# Patient Record
Sex: Male | Born: 1990 | Race: White | Hispanic: No | Marital: Single | Smoking: Current some day smoker
Health system: Southern US, Community
[De-identification: ages and names within clinical notes are randomized; demographics above are authoritative.]

## PROBLEM LIST (undated history)

## (undated) DIAGNOSIS — F840 Autistic disorder: Secondary | ICD-10-CM

## (undated) DIAGNOSIS — I10 Essential (primary) hypertension: Secondary | ICD-10-CM

## (undated) DIAGNOSIS — R569 Unspecified convulsions: Secondary | ICD-10-CM

## (undated) HISTORY — DX: Essential (primary) hypertension: I10

## (undated) HISTORY — DX: Unspecified convulsions: R56.9

---
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
## 2012-07-25 ENCOUNTER — Ambulatory Visit: Payer: Self-pay | Admitting: Neurology

## 2012-11-23 LAB — DRUG SCREEN, URINE
Amphetamines, Ur Screen: NEGATIVE
Barbiturates, Ur Screen: NEGATIVE
Benzodiazepine, Ur Scrn: NEGATIVE
Cannabinoid 50 Ng, Ur ~~LOC~~: NEGATIVE
Cocaine Metabolite,Ur ~~LOC~~: NEGATIVE
MDMA (Ecstasy)Ur Screen: NEGATIVE
Methadone, Ur Screen: NEGATIVE
Opiate, Ur Screen: NEGATIVE
Phencyclidine (PCP) Ur S: NEGATIVE
Tricyclic, Ur Screen: NEGATIVE

## 2012-11-23 LAB — URINALYSIS, COMPLETE
Bacteria: NONE SEEN
Blood: NEGATIVE
Leukocyte Esterase: NEGATIVE
Nitrite: NEGATIVE
Protein: NEGATIVE
RBC,UR: NONE SEEN /HPF (ref 0–5)
Specific Gravity: 1.014 (ref 1.003–1.030)
WBC UR: NONE SEEN /HPF (ref 0–5)

## 2012-11-23 LAB — CBC
HCT: 45.2 % (ref 40.0–52.0)
MCV: 80 fL (ref 80–100)
Platelet: 366 10*3/uL (ref 150–440)
RBC: 5.67 10*6/uL (ref 4.40–5.90)
WBC: 10.1 10*3/uL (ref 3.8–10.6)

## 2012-11-23 LAB — COMPREHENSIVE METABOLIC PANEL WITH GFR
Albumin: 4.5 g/dL
Alkaline Phosphatase: 131 U/L
Anion Gap: 13
BUN: 12 mg/dL
Bilirubin,Total: 0.1 mg/dL — ABNORMAL LOW
Calcium, Total: 8.2 mg/dL — ABNORMAL LOW
Chloride: 104 mmol/L
Co2: 21 mmol/L
Creatinine: 1.21 mg/dL
EGFR (African American): >60
EGFR (Non-African Amer.): >60
Glucose: 121 mg/dL — ABNORMAL HIGH
Osmolality: 277
Potassium: 3.3 mmol/L — ABNORMAL LOW
SGOT(AST): 23 U/L
SGPT (ALT): 22 U/L
Sodium: 138 mmol/L
Total Protein: 8.2 g/dL

## 2012-11-23 LAB — ETHANOL: Ethanol %: <0.003 % (ref 0.000–0.080)

## 2012-11-23 LAB — CARBAMAZEPINE LEVEL, TOTAL: Carbamazepine: 10.7 ug/mL (ref 4.0–12.0)

## 2012-11-24 ENCOUNTER — Inpatient Hospital Stay: Payer: Self-pay | Admitting: Internal Medicine

## 2012-11-24 ENCOUNTER — Ambulatory Visit: Payer: Self-pay | Admitting: Neurology

## 2012-11-25 DIAGNOSIS — R011 Cardiac murmur, unspecified: Secondary | ICD-10-CM

## 2012-11-25 LAB — CBC WITH DIFFERENTIAL/PLATELET
Basophil #: 0 10*3/uL (ref 0.0–0.1)
Basophil %: 0.5 %
HCT: 45.1 % (ref 40.0–52.0)
HGB: 14.7 g/dL (ref 13.0–18.0)
Lymphocyte %: 20.3 %
MCH: 25.8 pg — ABNORMAL LOW (ref 26.0–34.0)
MCV: 79 fL — ABNORMAL LOW (ref 80–100)
Monocyte #: 0.5 x10 3/mm (ref 0.2–1.0)
Platelet: 267 10*3/uL (ref 150–440)
RDW: 19.1 % — ABNORMAL HIGH (ref 11.5–14.5)
WBC: 4.3 10*3/uL (ref 3.8–10.6)

## 2012-11-25 LAB — BASIC METABOLIC PANEL
Co2: 24 mmol/L (ref 21–32)
EGFR (African American): >60
Glucose: 105 mg/dL — ABNORMAL HIGH (ref 65–99)
Potassium: 3.6 mmol/L (ref 3.5–5.1)
Sodium: 134 mmol/L — ABNORMAL LOW (ref 136–145)

## 2014-01-11 ENCOUNTER — Emergency Department: Payer: Self-pay | Admitting: Student

## 2014-01-11 LAB — DRUG SCREEN, URINE
Amphetamines, Ur Screen: NEGATIVE (ref ?–1000)
Barbiturates, Ur Screen: NEGATIVE (ref ?–200)
Benzodiazepine, Ur Scrn: NEGATIVE (ref ?–200)
COCAINE METABOLITE, UR ~~LOC~~: NEGATIVE (ref ?–300)
Cannabinoid 50 Ng, Ur ~~LOC~~: NEGATIVE (ref ?–50)
MDMA (ECSTASY) UR SCREEN: NEGATIVE (ref ?–500)
Methadone, Ur Screen: NEGATIVE (ref ?–300)
OPIATE, UR SCREEN: NEGATIVE (ref ?–300)
Phencyclidine (PCP) Ur S: NEGATIVE (ref ?–25)
Tricyclic, Ur Screen: NEGATIVE (ref ?–1000)

## 2014-01-11 LAB — COMPREHENSIVE METABOLIC PANEL
ALK PHOS: 143 U/L — AB
ANION GAP: 9 (ref 7–16)
AST: 16 U/L (ref 15–37)
Albumin: 3.7 g/dL (ref 3.4–5.0)
BILIRUBIN TOTAL: 0.1 mg/dL — AB (ref 0.2–1.0)
BUN: 6 mg/dL — AB (ref 7–18)
CALCIUM: 8.1 mg/dL — AB (ref 8.5–10.1)
CO2: 25 mmol/L (ref 21–32)
Chloride: 108 mmol/L — ABNORMAL HIGH (ref 98–107)
Creatinine: 0.93 mg/dL (ref 0.60–1.30)
EGFR (African American): >60
Glucose: 97 mg/dL (ref 65–99)
Osmolality: 281 (ref 275–301)
Potassium: 3.5 mmol/L (ref 3.5–5.1)
SGPT (ALT): 18 U/L
Sodium: 142 mmol/L (ref 136–145)
TOTAL PROTEIN: 7.1 g/dL (ref 6.4–8.2)

## 2014-01-11 LAB — CBC
HCT: 44.7 % (ref 40.0–52.0)
HGB: 14.4 g/dL (ref 13.0–18.0)
MCH: 28.2 pg (ref 26.0–34.0)
MCHC: 32.3 g/dL (ref 32.0–36.0)
MCV: 87 fL (ref 80–100)
PLATELETS: 262 10*3/uL (ref 150–440)
RBC: 5.12 10*6/uL (ref 4.40–5.90)
RDW: 15.2 % — AB (ref 11.5–14.5)
WBC: 5.8 10*3/uL (ref 3.8–10.6)

## 2014-01-11 LAB — ETHANOL: Ethanol: <3 mg/dL (ref 0–80)

## 2014-01-11 LAB — ACETAMINOPHEN LEVEL: Acetaminophen: <2 ug/mL

## 2014-01-11 LAB — TSH: Thyroid Stimulating Horm: 1.99 u[IU]/mL

## 2014-01-11 LAB — SALICYLATE LEVEL: Salicylates, Serum: <1.7 mg/dL

## 2014-01-19 IMAGING — CT CT HEAD WITHOUT CONTRAST
2 series · 15 of 30 positions shown, 19 images · non-contrast
Comparison: none

REASON FOR EXAM: sz recurrent
COMMENTS:

[Series 2: without · axial · non-contrast · 0.43mm/px · z∈[+156,+276]mm · 13 of 30 slices shown, 17 images]
[im 3/30  brain]
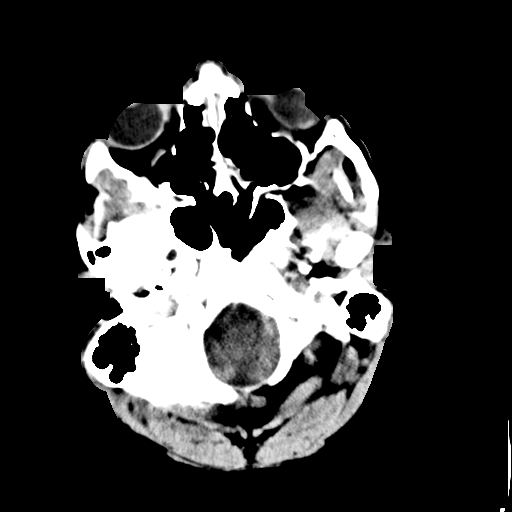
[im 3/30  bone]
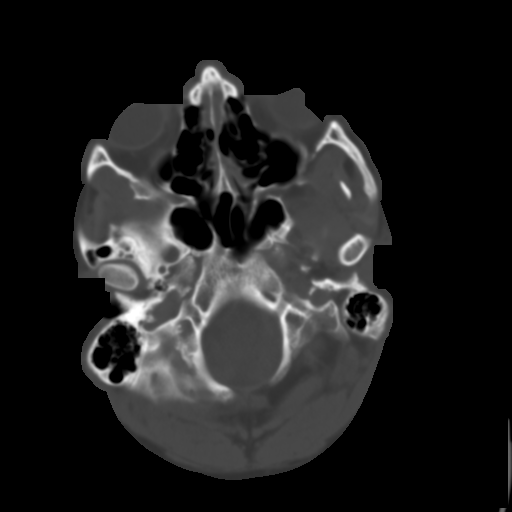
[im 5/30  brain]
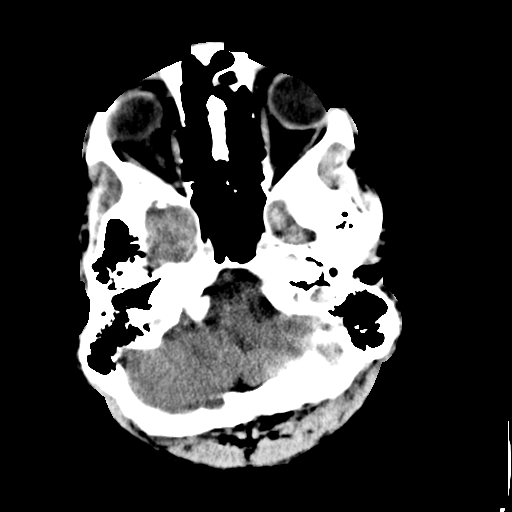
[im 7/30  brain]
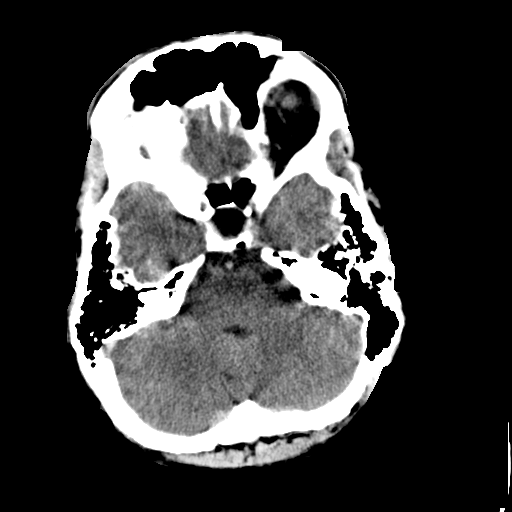
[im 9/30  brain]
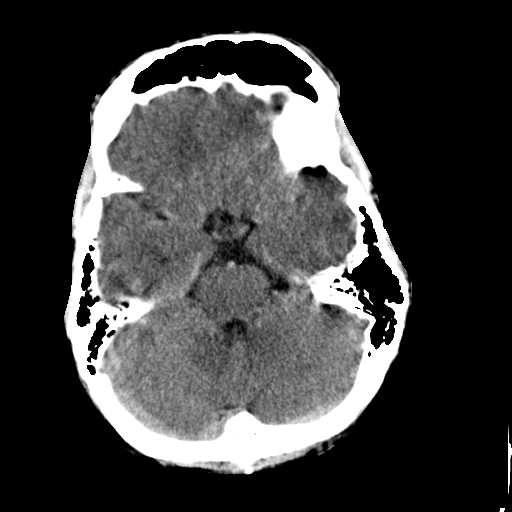
[im 11/30  brain]
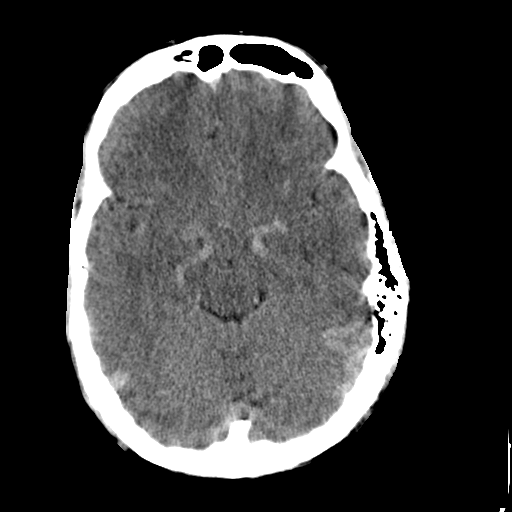
[im 11/30  bone]
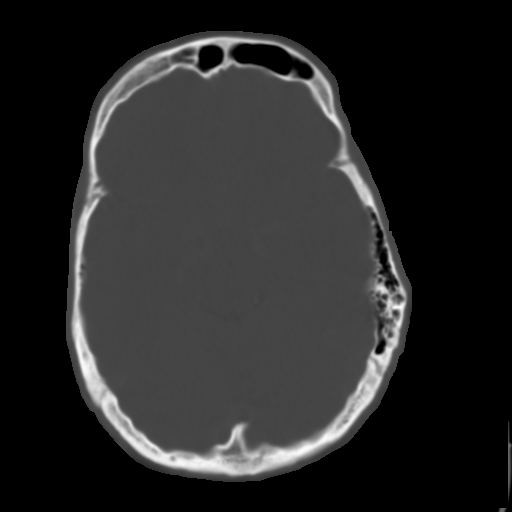
[im 13/30  brain]
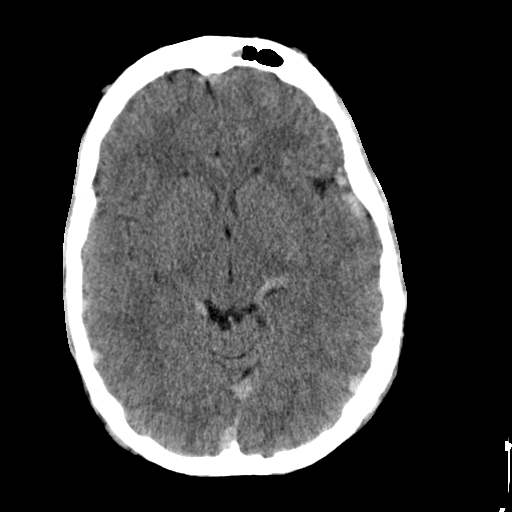
[im 15/30  brain]
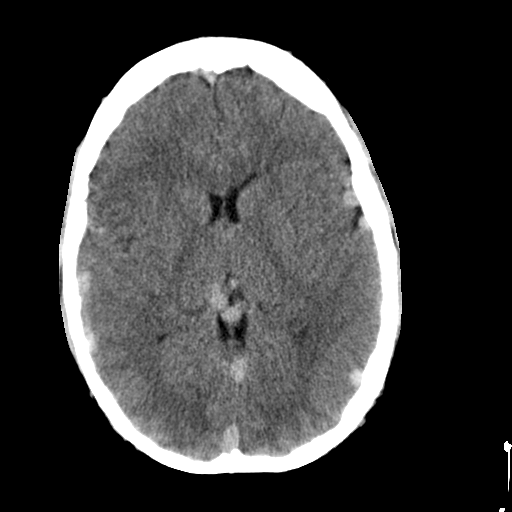
[im 17/30  brain]
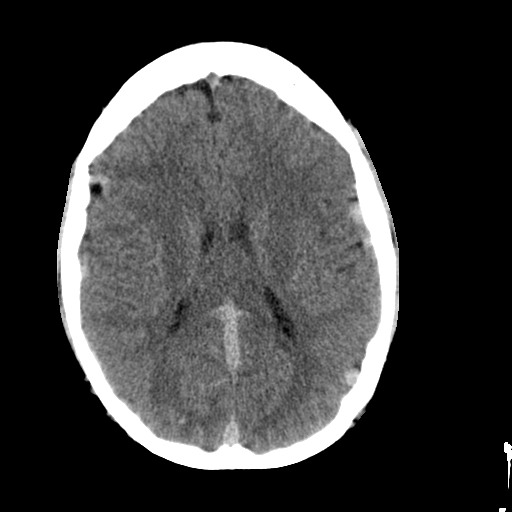
[im 19/30  brain]
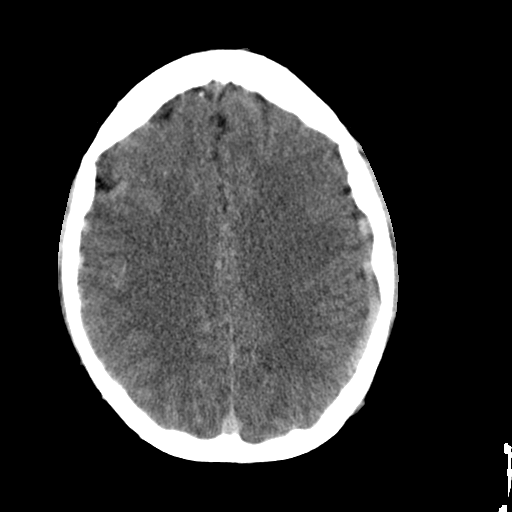
[im 19/30  bone]
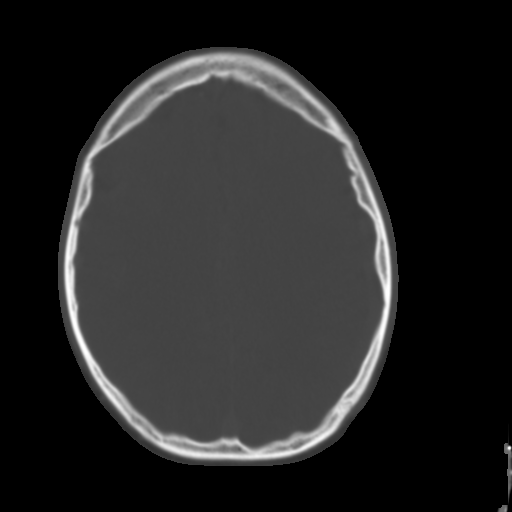
[im 21/30  brain]
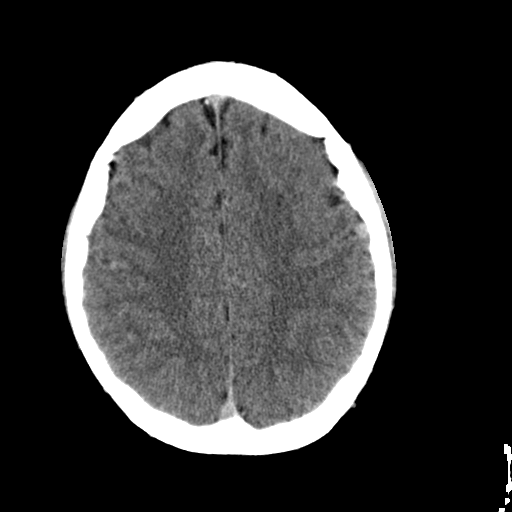
[im 23/30  brain]
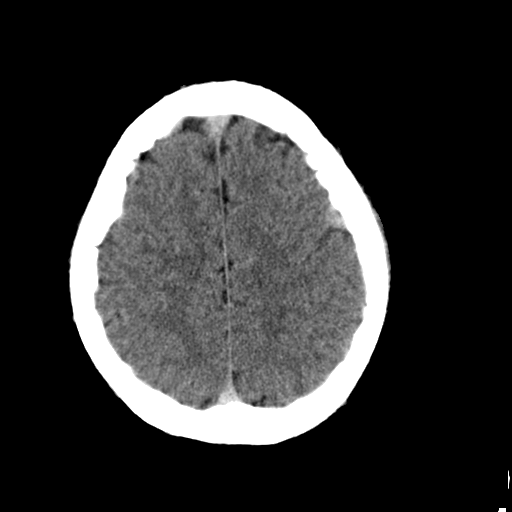
[im 25/30  brain]
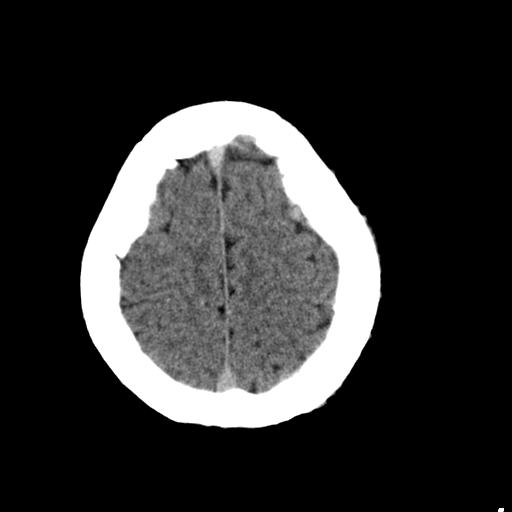
[im 27/30  brain]
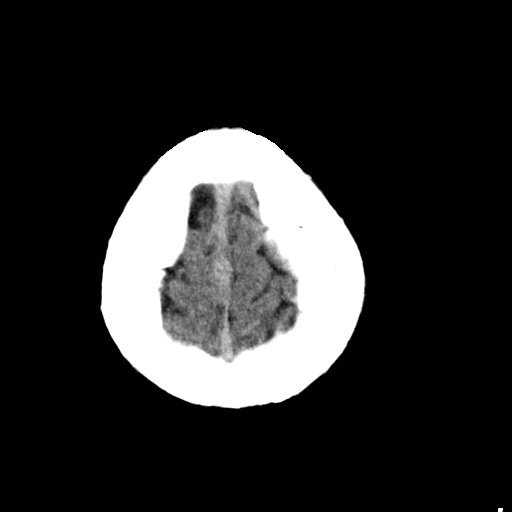
[im 27/30  bone]
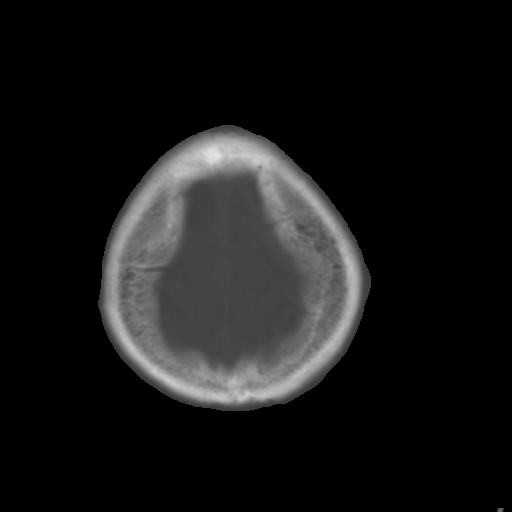

[Series 3: bone · axial · 0.43mm/px · z∈[+156,+176]mm · 2 of 30 slices shown]
[im 3/30  bone]
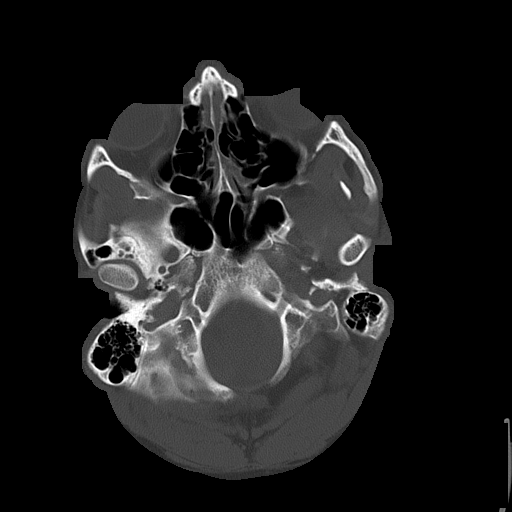
[im 7/30  bone]
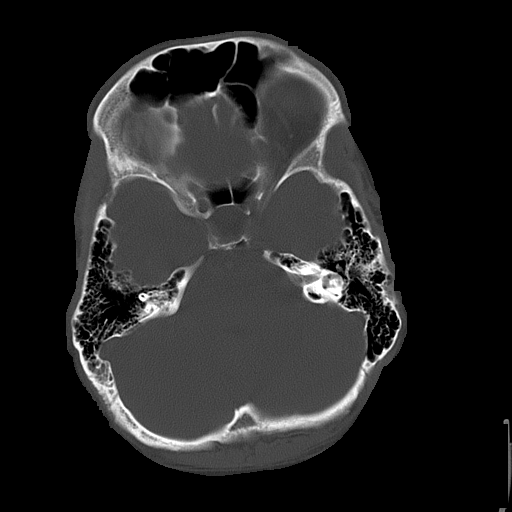

[15 of 30 positions shown; findings below may reference images not displayed]

PROCEDURE:     CT  - CT HEAD WITHOUT CONTRAST  - December 16, 2011  [DATE]

RESULT:     Emergent noncontrast CT of the brain is performed. The patient
has no previous exam for comparison.

There is increased density in the extra-axial regions bilaterally the there
is also increased density of the vessels in the region of the circle of
Willis and elsewhere. This can be seen in slow flow situations. The
extra-axial collections appear to have a somewhat tubular appearance and
could represent prominent vascular collections but the possibility of
subarachnoid hemorrhage ends small subdural hematomas should be considered
and transfer to a tertiary care center is recommended. No skull fracture is
evident. The sinuses are clear. The orbits appear unremarkable.
IMPRESSION: Abnormal appearance with prominence of the vessels in the
circle of Willis it can be related to slow flow artifact. Prominent areas of
increased density in the extra-axial regions especially of the left temporal
and parietal regions. Some increased density along sulci near the right
sylvian fissure. Findings are concerning for subarachnoid and subdural
hemorrhage.

[REDACTED](*)

## 2014-05-31 ENCOUNTER — Emergency Department: Payer: Self-pay | Admitting: Emergency Medicine

## 2014-06-07 ENCOUNTER — Emergency Department: Payer: Self-pay | Admitting: Emergency Medicine

## 2014-06-24 ENCOUNTER — Emergency Department
Admit: 2014-06-24 | Disposition: A | Discharge status: Home / Self Care | Payer: Self-pay | Admitting: Emergency Medicine

## 2014-06-24 LAB — COMPREHENSIVE METABOLIC PANEL
Albumin: 4.6 g/dL
Alkaline Phosphatase: 82 U/L
Anion Gap: 6 — ABNORMAL LOW (ref 7–16)
BILIRUBIN TOTAL: 0.4 mg/dL
BUN: 13 mg/dL
CHLORIDE: 112 mmol/L — AB
Calcium, Total: 8.4 mg/dL — ABNORMAL LOW
Co2: 21 mmol/L — ABNORMAL LOW
Creatinine: 0.83 mg/dL
EGFR (Non-African Amer.): >60
Glucose: 109 mg/dL — ABNORMAL HIGH
Potassium: 3.9 mmol/L
SGOT(AST): 18 U/L
SGPT (ALT): 15 U/L — ABNORMAL LOW
Sodium: 139 mmol/L
TOTAL PROTEIN: 7.6 g/dL

## 2014-06-24 LAB — DRUG SCREEN, URINE
AMPHETAMINES, UR SCREEN: NEGATIVE
BARBITURATES, UR SCREEN: NEGATIVE
Benzodiazepine, Ur Scrn: NEGATIVE
Cannabinoid 50 Ng, Ur ~~LOC~~: NEGATIVE
Cocaine Metabolite,Ur ~~LOC~~: NEGATIVE
MDMA (Ecstasy)Ur Screen: NEGATIVE
METHADONE, UR SCREEN: NEGATIVE
OPIATE, UR SCREEN: NEGATIVE
Phencyclidine (PCP) Ur S: NEGATIVE
Tricyclic, Ur Screen: NEGATIVE

## 2014-06-24 LAB — ETHANOL: Ethanol: <5 mg/dL

## 2014-06-24 LAB — CBC
HCT: 45.7 % (ref 40.0–52.0)
HGB: 14.7 g/dL (ref 13.0–18.0)
MCH: 26.4 pg (ref 26.0–34.0)
MCHC: 32.1 g/dL (ref 32.0–36.0)
MCV: 82 fL (ref 80–100)
Platelet: 263 10*3/uL (ref 150–440)
RBC: 5.55 10*6/uL (ref 4.40–5.90)
RDW: 15.6 % — AB (ref 11.5–14.5)
WBC: 5.1 10*3/uL (ref 3.8–10.6)

## 2014-06-24 LAB — URINALYSIS, COMPLETE
BILIRUBIN, UR: NEGATIVE
Blood: NEGATIVE
Glucose,UR: NEGATIVE mg/dL (ref 0–75)
KETONE: NEGATIVE
Leukocyte Esterase: NEGATIVE
Nitrite: NEGATIVE
Ph: 6 (ref 4.5–8.0)
SPECIFIC GRAVITY: 1.024 (ref 1.003–1.030)
SQUAMOUS EPITHELIAL: NONE SEEN
WBC UR: 4 /HPF (ref 0–5)

## 2014-06-24 LAB — SALICYLATE LEVEL: Salicylates, Serum: <4 mg/dL

## 2014-06-24 LAB — ACETAMINOPHEN LEVEL: Acetaminophen: <10 ug/mL

## 2014-07-17 NOTE — H&P (Signed)
PATIENT NAME:  Brendan Gamble, Brendan Gamble MR#:  [id number] DATE OF BIRTH:  10-Oct-1990  DATE OF ADMISSION:  11/24/2012  PRIMARY CARE PHYSICIAN:  Nonlocal out of [place].   REFERRING PHYSICIAN:  Dr. Kerman Passey.   HISTORY OF PRESENT ILLNESS:  Brendan Gamble is a 24 year old Hispanic gentleman, past medical history significant for ADHD, autism, seizure disorder, as well as history of subarachnoid hemorrhage requiring craniotomy, who is presenting after an episode of witnessed seizure, was witnessed by his father, described as tonic-clonic with generalized shaking, lasting 30 to 60 seconds, which broke spontaneously.  Since that original episode he has had five further seizures witnessed by EMS in the Emergency Department.  He has been given a total of 7 mg of Ativan and had no further seizure episodes.  As far as his seizure history, there is concern he has a history of seizures for a number of years, every 4 to 6 months.  His last seizure was approximately six months ago.  All with similar presentation, tonic-clonic in nature.  This particular episode he was in a postictal state for several minutes and then returned to baseline as according to his father, prior to his seizure episode.  He is a poor historian, but denies any fevers, chills, decreased by mouth intake or any further symptoms.  In the Emergency Department he was noted to be tachycardic with heart rates ranging between the 140s and 150s.   REVIEW OF SYSTEMS:  CONSTITUTIONAL:  Denies fevers, weight changes.   EYES:  Denies any visual changes.  EARS, NOSE, THROAT, MOUTH:  Denies any oral lesions.  CARDIOVASCULAR:  Denies chest pain or palpitations despite heart rate.  RESPIRATION:  Denies cough, shortness of breath.  GASTROINTESTINAL:  Denies nausea, vomiting, diarrhea, constipation.  GENITOURINARY:  Denies urinary frequency as well as dysuria.  MUSCULOSKELETAL:  No pain or weakness.  SKIN:  No lesions or rashes.  NEUROLOGICALLY:  Seizure as described as  above.  PSYCHIATRIC:  Denies any anxiety or depression.    HEMATOLOGY AND LYMPHATIC:  Denies easy bruisability or bleeding.  ALLERGY AND IMMUNOLOGY:  No active symptoms.  Otherwise full review of systems performed by me is negative.   PAST MEDICAL HISTORY:  Seizure disorder, once again every 4 to 6 months.  Most recent seizure prior to this episode was about six months ago.  Also has a past medical history of subarachnoid hemorrhage status post craniotomy, autism, ADHD.   FAMILY HISTORY:  Denies any history of seizure disorder per the father.   SOCIAL HISTORY:  Currently resides in a group facility with no alcohol, tobacco or illicit drug usage.   ALLERGIES:  No known drug allergies.   HOME MEDICATIONS:  Carbamazepine extended release 800 mg by mouth twice daily, diazepam 20 mg, rectal kit 12.5 mg 1 daily as needed for seizures lasting greater than five minutes, Pepcid 20 mg by mouth twice daily, lorazepam 0.5 mg oral tablet as needed for agitation, Zoloft 100 mg two tablets by mouth daily, Zonegran 200 mg by mouth q. a.m., 300 mg by mouth q. p.m.   PHYSICAL EXAMINATION:   VITAL SIGNS:  Temperature 99.3, pulse 145, respirations 20, blood pressure 130/70, pulse ox 97 on room air.  GENERAL:  He is in no acute distress and he is awake, alert, able to answer simple yes or no questions.  HEENT:  Normocephalic, atraumatic.  Extraocular muscles intact.  Pupils equal, round and reactive to light as well as accommodation.  Moist mucosal membranes.  He does have  a scar from his craniotomy which is well-healed.   CARDIOVASCULAR:  S1, S2, tachycardic, which is regular.  No murmurs, rubs or gallops.  PULMONARY:  Clear to auscultation bilaterally without wheezes, rubs or rhonchi.  ABDOMEN:  Soft, nontender, nondistended with positive bowel sounds.  EXTREMITIES:  Reveal no cyanosis, edema or clubbing.  NEUROLOGIC:  It is difficult to fully assess neurological exam secondary to patient's mental status and a  lack of compliance.   LABORATORY DATA:  Sodium 138, potassium 3.3, chloride 104, bicarb 21, BUN 12, creatinine 1.21, glucose 121, calcium 8.2.  Ethanol less than 0.003, total protein 8.2, albumin 4.5, bilirubin 0.1, alkaline phosphatase 131, AST 23, ALT 22.  Tegretol 10.7, within therapeutic window.  WBC 10.1, hemoglobin 14.7, platelets 366.  EKG, sinus tachycardic, heart rate of 148, narrow complex and irregular rhythm.  No ST or T abnormalities.  CT head performed revealing no acute changes.  Official read is pending at this time.   ASSESSMENT AND PLAN:  This is a 24 year old Hispanic gentleman with history of seizure disorder, last episode six months ago, history of subarachnoid hemorrhage status post craniotomy, autism, attention deficit hyperactivity disorder, who is presenting after multiple witnessed seizure episodes which are tonic and clonic in nature.  He required approximately 7 mg of Ativan in the Emergency Department.  He is also noted to be tachycardic with heart rate in the 150s. 1.  Multiple tonic-clonic seizures.  This gentleman has a history of a known seizure disorder and usually takes Tegretol as well as Zonegran.  His Tegretol level is within therapeutic window.  Recommend ordering a Zonegran level to be checked.  In the meantime, give Ativan is needed.  If his Zonegran level is therapeutic, we will consult neurology for breakthrough seizures.  Check a prolactin level as well to confirm his seizure status.  2.  Tachycardia with no evidence of infection, likely stress-related secondary to seizures.  Continue with IV fluid hydration and monitor heart rate for a response.  3.  Hypokalemia of 3.3.  Replace potassium.  4.  Hyperglycemia, most likely stress response.  No history of diabetes.  5.  Gastroesophageal reflux disease.  Continue with Pepcid.  6.  Anxiety.  Continue with Zoloft.  7.  The patient is FULL CODE.   Total time spent 33 minutes.     ____________________________ Aaron Mose. Hower, MD dkh:ea D: 11/23/2012 23:13:14 ET T: 11/24/2012 01:05:16 ET JOB#: [id number]  cc: Aaron Mose. Hower, MD, <Dictator> DAVID Woodfin Ganja MD ELECTRONICALLY SIGNED 11/24/2012 3:46

## 2014-07-17 NOTE — Consult Note (Signed)
PATIENT NAME:  Brendan Gamble, Brendan Gamble MR#:  [id number] DATE OF BIRTH:  [birth year]  DATE OF CONSULTATION:  11/24/2012  CONSULTING PHYSICIAN:  Leotis Pain, MD  REASON FOR CONSULTATION:  Seizure.   HISTORY OF PRESENT ILLNESS:  HPI is obtained from patient's chart and patient's doctor. [name] is a 24 year old Hispanic gentleman with past medical history of ADHD, autism, seizure disorder, history of subarachnoid hemorrhage, status post left craniotomy, presenting after episode of seizure that was witnessed by his father. Described as tonic-clonic seizure activity lasting 30 to 60 seconds, which resolved spontaneously. The patient was postictal. EMS was called. While en route via EMS and in the Emergency Department, he had multiple seizures back to back, again generalized tonic-clonic seizures. He was given a total of 7 mg of Ativan. Last seizure about 6 months ago, which was similar to the current presentation of tonic-clonic seizure activity. The patient's medications include Zonegran 200 mg in morning, 300 mg at night, and Topamax 800 mg twice a day, with therapeutic level of 10.7. Currently patient appears to be improving. His mental status has improved. Not sure if he is quite at baseline yet, but does follow my commands.   REVIEW OF SYSTEMS:  Unable to assess from patient's current condition, which could be at his baseline.   PAST MEDICAL HISTORY: History of seizure disorder, last seizure about 6 months ago, history of autism, ADHD, subarachnoid hemorrhage, status post craniotomy.   FAMILY HISTORY:  No history of seizures.   SOCIAL HISTORY:  Currently resides in a group home.   ALLERGIES:  No known drug allergies.   MEDICATIONS:  Include Tegretol 800 mg twice a day, diazepam 20 mg rectal kit as needed  for seizures, Pepcid 20 mg a day, lorazepam 0.5 mg p.r.n. for agitation, Zoloft 100 mg by mouth daily, Zonegran 200 mg in morning, 300 mg in evening.  LABORATORY WORKUP:  Included a urinalysis, which  is negative for urinary tract infection. U-tox is negative as well. His white blood cell count is 10.1, hemoglobin 14.7, hematocrit 45.2, glucose serum is 121, BUN 12, creatinine is 1.21. Imaging includes a CAT scan of the head that did not show any acute abnormality.   PHYSICAL EXAMINATION: VITAL SIGNS:  Includes blood pressure 120/67, respirations 22, pulse is elevated at 111.   NEUROLOGIC: The patient is able to follow one-step commands, such as sticking out his tongue, show me his  right thumb and then his left thumb. Able to close his eyes symmetrically, responds to visual threats. Appears to have generalized weakness bilateral upper and lower extremities, but withdraws from painful stimuli. Reflexes are elevated throughout. Sensation  appears to be intact, as he withdraws from painful stimuli. Coordination could not be assessed. Gait could not be assessed.   IMPRESSION: A 24 year old male with history of seizure disorder, history of subarachnoid hemorrhage, status post craniotomy, on dual antiepileptics including carbamazepine 800 b.i.d., with a level of 10.7, Zonegran 200 mg in morning and 300 mg in evening, presenting with multiple seizures. Unclear what the provoking source was, last seizure 4 to 6 months ago.   PLAN: The patient's Zonegran is increased to 300 mg b.i.d., which is the max dose. Continue the Tegretol at 800 mg b.i.d., as is therapeutic. MRI of the brain with and without contrast to look at any intracranial pathology which could be contributing to possibility of his worsening seizures. It appears that patient's mental status has significantly improved, but unclear what his baseline is at this point. If MRI  of the brain that is done with and without contrast does not show any acute intracranial pathology, will consider discharging patient tomorrow if his mental status is back to baseline on the current antiepileptics of 800 mg of Tegretol b.i.d. and Zonegran 300 mg b.i.d. He can follow  up with EEG as outpatient.   Thank you, it was a pleasure seeing this patient.    ____________________________ Leotis Pain, MD yz:mr D: 11/24/2012 12:26:00 ET T: 11/24/2012 19:24:22 ET JOB#: [id number]  cc: Leotis Pain, MD, <Dictator> Leotis Pain MD ELECTRONICALLY SIGNED 12/09/2012 12:12

## 2014-07-17 NOTE — Discharge Summary (Signed)
PATIENT NAME:  Brendan Gamble, Brendan Gamble MR#:  [id number] DATE OF BIRTH:  09/12/1990  DATE OF ADMISSION:  11/24/2012 DATE OF DISCHARGE:  11/26/2012  ADMITTING DIAGNOSIS: Multiple tonic-clonic seizures.   DISCHARGE DIAGNOSES: 1.  Multiple tonic-clonic seizure, with history of previous seizure disorder.  2.  Autism.  3.  Attention deficit/hyperactivity disorder.  4.  Anxiety.  5.  History of right lower lobe pneumonia in 2007, requiring hospitalization.  6.  Sinus tachycardia, possibly anxiety-related; heart rate now improved on a beta blocker.   CONSULTANTS: Dr. Harlene Ramus.   PERTINENT LABS AND EVALUATIONS: Admitting glucose 121, BUN 12, creatinine 1.21, sodium 138, potassium 3.3, chloride 104, CO2 is 21, calcium is 8.2.   LFTs are normal except bili total 0.1. Tegretol level 10.7, TUDS were negative. WBC 10.1, hemoglobin 14.7, platelet count 366, prolactin level was 96.8.   Echocardiogram showed EF of 60% to 65%, normal global left ventricular systolic function.   EKG shows sinus tachycardia   CT scan of the head shows no acute abnormality. Postsurgical changes noted about the skull. Surgical wires noted.  HOSPITAL COURSE: Please refer to H and P done by the admitting physician. The patient is a 24 year old Hispanic male with a past medical history of ADHD, autism, seizure disorder as well as a history of subarachnoid hemorrhage requiring craniotomy, presented after an episode of a witnessed seizure. The patient was on carbamazepine and Zonegran. The levels of these were therapeutic. The patient was admitted for seizures. He was seen in consultation by Dr. Irish Elders of neurology.   Plan was to do an MRI, however, the patient has surgical clips so he was unable to have an MRI. The patient was scheduled to have an EEG because of the weekend today, but Dr. Irish Elders felt that he did not need an EEG since he had no further seizures in 48 hours after starting Keppra. At this time, the patient is doing  well and is not having any further seizure episodes. He is stable for discharge.   DISCHARGE MEDICATIONS: Diazepam 20 mg rectal kit, 12.5 rectally once a day as needed for seizure lasting more than 5 minutes, Zoloft 200 daily, Carbatrol 200 mg 4 capsules  b.i.d., Zonegran 300 mg at bedtime, lorazepam 0.5 daily as needed, Zonegran 100 mg 2 capsules daily, Keppra 750 mg 1 tablet p.o. q.12, metoprolol tartrate 25 one tablet p.o. t.i.d.   DIET: Regular.   ACTIVITY: As tolerated.   FOLLOWUP: With primary MD in 1 to 2 weeks. Follow with neurology, Dr. Melrose Nakayama, his primary neurologist, in 2 to 4 weeks.    Note: 35 minutes spent on this discharge.     ____________________________ Lafonda Mosses. Posey Pronto, MD shp:dm D: 11/27/2012 08:19:00 ET T: 11/27/2012 09:11:41 ET JOB#: [id number]  cc: Sammy Douthitt H. Posey Pronto, MD, <Dictator> Alric Seton MD ELECTRONICALLY SIGNED 11/29/2012 15:11
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
## 2014-07-19 NOTE — H&P (Signed)
PATIENT NAME:  Brendan Gamble, Brendan Gamble MR#:  [id number] DATE OF BIRTH:  24-Jun-1990  DATE OF ADMISSION:  04/30/2011  REFERRING PHYSICIAN: Dr. Renee Ramus  PRIMARY CARE PHYSICIAN: Unknown  REASON FOR ADMISSION: Seizures.  HISTORY OF PRESENT ILLNESS: Patient is an 24 year old male with past medical history listed below including history of known seizure disorder, attention deficit hyperactivity disorder, autism who is sent from a local group home to the ER due to witnessed seizures while at the group home. Patient was brought to the ER was witnessed to have active seizures in the ER as well requiring use of IV Ativan. Currently on my evaluation he is postictal and unresponsive. He is, however, hemodynamically stable with stable vitals. He did not respond to deep tactile stimuli or to verbal commands. No family members are currently present and a sitter was here initially from the group home, however, there are no staff members from the group home currently present in the ER. History was obtained from speaking with the ER physician. ER physician informed me that patient had seizures at the group home witnessed and also witnessed here in the ER requiring IV Ativan and patient is now postictal. Apparently per group home staff patient has been compliant with his anticonvulsant therapy consisting of Zonegran and carbamazepine. Serum carbamazepine levels have been sent off in the ER but have not returned yet. Noncontrast head CT was obtained which was negative for acute intracranial abnormalities. Patient is currently afebrile and without leukocytosis. Given recurrent seizures hospitalist services were contacted for further evaluation.   PAST SURGICAL HISTORY: Unknown.   PAST MEDICAL HISTORY: 1. Seizure disorder.  2. Autism.  3. Attention deficit hyperactivity disorder.  4. Anxiety.  5. History of right lower lobe pneumonia requiring hospitalization January 2007.   ALLERGIES: No known drug allergies.   DISCHARGE  MEDICATIONS:  1. Zonegran 100 mg 2 caplets p.o. b.i.d.  2. Zoloft 100 mg 2 tablets p.o. daily.  3. Risperidone 0.5 mg, patient takes 0.25 tabs p.o. b.i.d.  4. Lorazepam 0.5 mg 1/2 tab in the a.m. for anxiety.  5. Diazepam 20 mg rectal kit 12 mg rectally once daily as needed for seizure lasting more than five minutes.  6. Carbatrol extended release 200 mg, patient takes 3 caplets every morning and 4 caplets at bedtime.   FAMILY HISTORY: Unobtainable from the patient. According to most recent history and physical there is no family history of seizure disorder.   SOCIAL HISTORY: Unobtainable from the patient as he is currently unresponsive.   REVIEW OF SYSTEMS: Unobtainable from the patient as he is currently unresponsive.   PHYSICAL EXAMINATION:  VITAL SIGNS: Temperature 99.7, pulse 95, blood pressure 136/52, respirations 20, oxygen saturation 99% on room air.   GENERAL: Patient is unresponsive, does not respond to tactile or verbal stimuli, does not appear to be acutely distressed. He is not actively seizing at this time.   HEENT: Normocephalic, atraumatic. Pupils are pinpoint but reactive. Cannot assess his extraocular muscle function but doll's eyes are intact bilaterally. Sclerae and conjunctivae pink. Nares without drainage. Difficult to inspect oropharynx fully as patient does not follow commands but tongue appears moist, no lacerations or lesions noted on tongue.   NECK: Supple. No jugular venous distention, lymphadenopathy, or carotid bruits bilaterally. No thyromegaly.   CHEST: Normal respiratory effort without use of accessory respiratory muscles. Lungs are clear to auscultation bilateral without crackles, rales, wheezing.   CARDIOVASCULAR: S1, S2 positive. Regular rate and rhythm. No murmurs, rubs, or gallops. PMI is non-lateralized.  ABDOMEN: Patient did not wince with deep palpation. Normoactive bowel sounds. No hepatosplenomegaly or palpable masses. No hernia.   EXTREMITIES:  No clubbing, cyanosis, or edema. Pedal pulses are palpable bilaterally.   SKIN: No suspicious rash.   LYMPH: No cervical lymphadenopathy.   NEUROLOGICAL: Limited exam as patient is currently postictal and currently unresponsive and received IV Ativan a short while ago. Hypoactive patellar deep tendon reflexes. Babinski sign is equivocal bilaterally. Pupils are reactive to light bilaterally. Patient is unresponsive at this time.   PSYCH: Cannot assess his affect currently.   LABORATORY, DIAGNOSTIC AND RADIOLOGICAL DATA:  Noncontrast head CT: No acute intracranial abnormalities are noted.   Urinalysis is benign.  Complete metabolic panel: Sodium 711, potassium 3.7, chloride 102, bicarbonate 25, BUN 7, creatinine 0.62, glucose 96, calcium 8.7, anion gap 12, total protein 8.1, albumin 4.6, total bilirubin 0.6, AST 29, ALT 37, alkaline phosphatase 105.  CBC within normal limits.   Serum magnesium level 1.9.   Serum carbamazepine level is pending at this time.   ASSESSMENT AND PLAN: 24 year old male with history of seizure disorder, autism, attention deficit hyperactivity disorder, anxiety here with recurrent seizures.  1. Seizures in patient with known seizure disorder-Will admit patient to Med/Surg unit with off unit telemetry. Continue seizure precautions. He is currently postictal and unresponsive in the ER, however, his vitals and hemodynamics are currently stable. Will obtain frequent neuro checks. He has been also started on IV Keppra for now for seizure prophylaxis since he will be n.p.o. and unable to take his oral medications. Will also use p.r.n. IV Ativan as needed for recurrent seizures. Not sure if this is from a subtherapeutic antiepileptic drug level, however, apparently from staff members at the group home patient has been compliant with his anticonvulsant therapy. Tegretol levels have been sent and will be followed. In the meanwhile, will obtain neurology consultation. Question  would be whether to perform further evaluation with MRI or EEG or to adjust patient's antiepileptic therapy but as above will await serum Tegretol level and will also wait until patient is seen by neurology before performing further work-up. Further work-up and management to follow depending on patient's clinical course.  2. History of anxiety-Patient is on p.o. Ativan at baseline. He is currently unresponsive and postictal. Will hold sedating medications as much as possible and he will be kept on p.r.n. Ativan for recurrent seizure activity. Otherwise will hold off on providing scheduled benzodiazepine therapy until patient is more alert and awake.  3. History of autism and attention deficit hyperactivity disorder-Medications need to be verified. Apparently he is on Risperdal and Zoloft at home. Need to hold these agents for now as patient will currently be n.p.o.   4. Deep vein thrombosis prophylaxis-SCDs and TEDs. 5. CODE STATUS: FULL CODE.  TIME SPENT ON THIS ADMISSION: Approximately 45 minutes.  ____________________________ Romie Jumper, MD knl:cms D: 04/30/2011 16:29:00 ET T: 05/01/2011 05:35:02 ET JOB#: [id number]  cc: Romie Jumper, MD, <Dictator> Romie Jumper MD ELECTRONICALLY SIGNED 05/14/2011 3:25

## 2014-07-19 NOTE — Discharge Summary (Signed)
PATIENT NAME:  Brendan Gamble, Brendan Gamble MR#:  [id number] DATE OF BIRTH:  12/12/1990  DATE OF ADMISSION:  04/30/2011 DATE OF DISCHARGE:  05/02/2011  ADMITTING DIAGNOSIS: Seizure.   DISCHARGE DIAGNOSES:  1. History of seizure disorder with subtherapeutic Tegretol level. His Tegretol has been increased, no further seizures.  2. Autism.  3. Attention deficit hyperactivity disorder.  4. Anxiety.  5. History of right lower lobe pneumonia in 2007 requiring hospitalizations.   CONSULTANT: Michaela Corner, MD  PERTINENT LABS/STUDIES: Non-contrast CT scan of the head showed no acute intracranial abnormalities noted.   Urinalysis was negative.   BMP: Sodium 139, potassium 3.7, chloride 102, bicarbonate 25, BUN 7, creatinine 0.62, glucose 96, calcium 8.7, anion gap 12, total protein 8.1, albumin 4.6, total bilirubin 0.6, AST 29, ALT 37, alkaline phosphatase 105. CBC was normal. Serum magnesium level 1.9. Tegretol level 3.0. TUDs were negative.   HOSPITAL COURSE: Please see history and physical done by the admitting physician. The patient is an 24 year old Spanish male with past medical history of seizure disorder. He has a history of anxiety, autism, and attention deficit/hyperactivity disorder who was sent from a local group home to the ER due to witnessed seizure, while in the group home. The patient was given IV Ativan to control his seizure. The patient was postictal and had decreased responsiveness. Due to this, we were asked to admit the patient. The patient was observed in the hospital. His Tegretol levels were noted to be low so his Carbatrol dose was increased. He was seen in consultation by Dr. Loletta Specter of neurology who agreed with the plan. The patient normally follows up with Dr. Rogelia Boga at [facility]. He recommends follow-up with his primary neurologist who may consider starting another antiepileptic such as Vimpat. He recommended outpatient follow up for this. The patient is currently stable.    DISCHARGE MEDICATIONS:  1. Diazepam rectal kit 12.5 mg rectally once a day as needed for seizure lasting more than 5 minutes.  2. Lorazepam 0.5 mg 1/2 tab every a.m. for anxiety. 3. Zonegran 100 mg two caps twice a day. 4. Risperdal 0.5 mg 1/2 tab twice a day. 5. Zoloft 200 mg daily.  6. Risperdal 0.25 mg 1 tab p.o. twice a day. 7. Carbatrol 200 mg 4 caps in the morning and four caps at bedtime.   DIET: Regular.   ACTIVITY: As tolerated.   TIMEFRAME FOR FOLLOW UP: Followup in 1 to 2 weeks with primary neurologist, Dr. Rogelia Boga, at [facility]. Outpatient psychiatry referral for behavioral issues.   TIME SPENT:  35 minutes. ____________________________ Lafonda Mosses Posey Pronto, MD shp:slb D: 05/02/2011 11:50:24 ET T: 05/02/2011 12:19:19 ET JOB#: [id number]  cc: Morrigan Wickens H. Posey Pronto, MD, <Dictator> Alric Seton MD ELECTRONICALLY SIGNED 05/13/2011 10:01

## 2014-07-19 NOTE — H&P (Signed)
PATIENT NAME:  Brendan Gamble, SCHEAR MR#:  [id number] DATE OF BIRTH:  24-Jun-1990  DATE OF ADMISSION:  04/30/2011  REFERRING PHYSICIAN: Dr. Renee Ramus  PRIMARY CARE PHYSICIAN: Unknown  REASON FOR ADMISSION: Seizures.  HISTORY OF PRESENT ILLNESS: Patient is an 24 year old male with past medical history listed below including history of known seizure disorder, attention deficit hyperactivity disorder, autism who is sent from a local group home to the ER due to witnessed seizures while at the group home. Patient was brought to the ER was witnessed to have active seizures in the ER as well requiring use of IV Ativan. Currently on my evaluation he is postictal and unresponsive. He is, however, hemodynamically stable with stable vitals. He did not respond to deep tactile stimuli or to verbal commands. No family members are currently present and a sitter was here initially from the group home, however, there are no staff members from the group home currently present in the ER. History was obtained from speaking with the ER physician. ER physician informed me that patient had seizures at the group home witnessed and also witnessed here in the ER requiring IV Ativan and patient is now postictal. Apparently per group home staff patient has been compliant with his anticonvulsant therapy consisting of Zonegran and carbamazepine. Serum carbamazepine levels have been sent off in the ER but have not returned yet. Noncontrast head CT was obtained which was negative for acute intracranial abnormalities. Patient is currently afebrile and without leukocytosis. Given recurrent seizures hospitalist services were contacted for further evaluation.   PAST SURGICAL HISTORY: Unknown.   PAST MEDICAL HISTORY: 1. Seizure disorder.  2. Autism.  3. Attention deficit hyperactivity disorder.  4. Anxiety.  5. History of right lower lobe pneumonia requiring hospitalization January 2007.   ALLERGIES: No known drug allergies.   DISCHARGE  MEDICATIONS:  1. Zonegran 100 mg 2 caplets p.o. b.i.d.  2. Zoloft 100 mg 2 tablets p.o. daily.  3. Risperidone 0.5 mg, patient takes 0.25 tabs p.o. b.i.d.  4. Lorazepam 0.5 mg 1/2 tab in the a.m. for anxiety.  5. Diazepam 20 mg rectal kit 12 mg rectally once daily as needed for seizure lasting more than five minutes.  6. Carbatrol extended release 200 mg, patient takes 3 caplets every morning and 4 caplets at bedtime.   FAMILY HISTORY: Unobtainable from the patient. According to most recent history and physical there is no family history of seizure disorder.   SOCIAL HISTORY: Unobtainable from the patient as he is currently unresponsive.   REVIEW OF SYSTEMS: Unobtainable from the patient as he is currently unresponsive.   PHYSICAL EXAMINATION:  VITAL SIGNS: Temperature 99.7, pulse 95, blood pressure 136/52, respirations 20, oxygen saturation 99% on room air.   GENERAL: Patient is unresponsive, does not respond to tactile or verbal stimuli, does not appear to be acutely distressed. He is not actively seizing at this time.   HEENT: Normocephalic, atraumatic. Pupils are pinpoint but reactive. Cannot assess his extraocular muscle function but doll's eyes are intact bilaterally. Sclerae and conjunctivae pink. Nares without drainage. Difficult to inspect oropharynx fully as patient does not follow commands but tongue appears moist, no lacerations or lesions noted on tongue.   NECK: Supple. No jugular venous distention, lymphadenopathy, or carotid bruits bilaterally. No thyromegaly.   CHEST: Normal respiratory effort without use of accessory respiratory muscles. Lungs are clear to auscultation bilateral without crackles, rales, wheezing.   CARDIOVASCULAR: S1, S2 positive. Regular rate and rhythm. No murmurs, rubs, or gallops. PMI is non-lateralized.  ABDOMEN: Patient did not wince with deep palpation. Normoactive bowel sounds. No hepatosplenomegaly or palpable masses. No hernia.   EXTREMITIES:  No clubbing, cyanosis, or edema. Pedal pulses are palpable bilaterally.   SKIN: No suspicious rash.   LYMPH: No cervical lymphadenopathy.   NEUROLOGICAL: Limited exam as patient is currently postictal and currently unresponsive and received IV Ativan a short while ago. Hypoactive patellar deep tendon reflexes. Babinski sign is equivocal bilaterally. Pupils are reactive to light bilaterally. Patient is unresponsive at this time.   PSYCH: Cannot assess his affect currently.   LABORATORY, DIAGNOSTIC AND RADIOLOGICAL DATA:  Noncontrast head CT: No acute intracranial abnormalities are noted.   Urinalysis is benign.  Complete metabolic panel: Sodium 794, potassium 3.7, chloride 102, bicarbonate 25, BUN 7, creatinine 0.62, glucose 96, calcium 8.7, anion gap 12, total protein 8.1, albumin 4.6, total bilirubin 0.6, AST 29, ALT 37, alkaline phosphatase 105.  CBC within normal limits.   Serum magnesium level 1.9.   Serum carbamazepine level is pending at this time.   ASSESSMENT AND PLAN: 24 year old male with history of seizure disorder, autism, attention deficit hyperactivity disorder, anxiety here with recurrent seizures.  1. Seizures in patient with known seizure disorder-Will admit patient to Med/Surg unit with off unit telemetry. Continue seizure precautions. He is currently postictal and unresponsive in the ER, however, his vitals and hemodynamics are currently stable. Will obtain frequent neuro checks. He has been also started on IV Keppra for now for seizure prophylaxis since he will be n.p.o. and unable to take his oral medications. Will also use p.r.n. IV Ativan as needed for recurrent seizures. Not sure if this is from a subtherapeutic antiepileptic drug level, however, apparently from staff members at the group home patient has been compliant with his anticonvulsant therapy. Tegretol levels have been sent and will be followed. In the meanwhile, will obtain neurology consultation. Question  would be whether to perform further evaluation with MRI or EEG or to adjust patient's antiepileptic therapy but as above will await serum Tegretol level and will also wait until patient is seen by neurology before performing further work-up. Further work-up and management to follow depending on patient's clinical course.  2. History of anxiety-Patient is on p.o. Ativan at baseline. He is currently unresponsive and postictal. Will hold sedating medications as much as possible and he will be kept on p.r.n. Ativan for recurrent seizure activity. Otherwise will hold off on providing scheduled benzodiazepine therapy until patient is more alert and awake.  3. History of autism and attention deficit hyperactivity disorder-Medications need to be verified. Apparently he is on Risperdal and Zoloft at home. Need to hold these agents for now as patient will currently be n.p.o.   4. Deep vein thrombosis prophylaxis-SCDs and TEDs. 5. CODE STATUS: FULL CODE.  TIME SPENT ON THIS ADMISSION: Approximately 45 minutes.  ____________________________ Romie Jumper, MD knl:cms D: 04/30/2011 16:29:15 ET T: 05/01/2011 05:35:02 ET JOB#: [id number]  cc: Romie Jumper, MD, <Dictator>

## 2014-07-19 NOTE — Consult Note (Signed)
PATIENT NAME:  Brendan Gamble, Brendan Gamble MR#:  [id number] DATE OF BIRTH:  November 19, 1990  DATE OF CONSULTATION:  05/02/2011  REFERRING PHYSICIAN:  Dr. Cherylann RatelLateef CONSULTING PHYSICIAN:  Rose PhiPeter R. Kemper Durielarke, MD  HISTORY: Brendan Gamble is an 24 year old Hispanic-American patient of Dr[facility] Shann Medal[facility]oline [facility] with history of seizure disorder, ADHD, anxiety, and question of autism who was admitted 04/30/2011 and is referred for evaluation of seizures. History comes from his hospital chart and hospital records including record of emergency room visits.  The patient was brought to the Emergency Room at 1:00 p.m. on 04/30/2011 by EMTs summoned by staff of [facility] secondary to seizures. After further reported seizures in the Emergency Room, he was treated with IV Ativan and then was noted to be poorly responsive, concluded secondary to a combination of postictal state and the effect of the Ativan. Laboratories studies on admission were notable for carbamazepine level low at 3.0. He had had similarly very low carbamazepine level at one time in the past, level 2.1 on 07/01/2006, when he was admitted for seizures out of control. Since May 2007, he has had 10 other visits to [facility], was seen in the emergency room for a seizure. Carbamazepine levels for those visits being between 7.0 and 11.7. A staff member at [facility] reached by telephone reported that the patient can have a seizure once or twice a month, occasionally every two months, but that he seldom has more than one seizure in a day, therefore prompting his being sent to the hospital. He is on Carbatrol a form of carbamazepine 200 mg, taking three in the morning and four at night. He is on Zonegran 100 mg taking two twice a day. A call to his [facility] [phone]) indicates that the Carbatrol was last filled on 04/03/2011, earlier refilled on 03/07/2011. Zonegran was filled on 04/06/2011, 03/07/2011, and 01/31/2011.   PHYSICAL EXAMINATION: The  patient is a well-developed, well-nourished Hispanic-American gentleman who was examined lying semisupine with blood pressure 95/62 and heart rate 88. He was normocephalic without evidence of trauma and his neck was supple. He was alert and was verbal, hyperactive and memory fixated on learning that heart rhythms are checked in the hospital. He had good spontaneous movements of the four extremities and good strength to limited testing. With best elicitable cooperation, visual fields were full to finger count bilaterally.   IMPRESSION: Breakthrough of seizures in a patient with history of chronic seizure disorder  never fully controlled, in the setting of significantly low level of carbamazepine. I do not suspect central nervous system infection.   RECOMMENDATIONS:  1. I agree with his present work-up and treatment in the hospital including continuing treatment with carbamazepine and Zonegran.  2. I do not see indication for lumbar puncture.  3. Arrange follow-up with [facility] Wet[facility] at [facility]. I do not know whether he has had trial of other medications for which could reduce frequency of seizures; he may have benefit from a trial of Vimpat by Dr. Wetzel BjornstadZook.   I appreciate being asked to see this pleasant and interesting gentleman.  ____________________________ Rose PhiPeter R. Kemper Durielarke, MD prc:slb D: 05/02/2011 08:08:50 ET T: 05/02/2011 10:02:17 ET JOB#: [id number]  cc: Rose PhiPeter R. Kemper Durielarke, MD, <Dictator> Gaspar GarbePETER R Jerika Wales MD ELECTRONICALLY SIGNED 05/08/2011 13:33

## 2014-07-26 ENCOUNTER — Emergency Department
Admission: EM | Admit: 2014-07-26 | Discharge: 2014-09-18 | Disposition: A | Discharge status: Home / Self Care | Payer: Medicaid Other | Attending: Student | Admitting: Student

## 2014-07-26 ENCOUNTER — Encounter: Payer: Self-pay | Admitting: *Deleted

## 2014-07-26 DIAGNOSIS — Y9289 Other specified places as the place of occurrence of the external cause: Secondary | ICD-10-CM | POA: Diagnosis not present

## 2014-07-26 DIAGNOSIS — S00511A Abrasion of lip, initial encounter: Secondary | ICD-10-CM | POA: Diagnosis not present

## 2014-07-26 DIAGNOSIS — F911 Conduct disorder, childhood-onset type: Secondary | ICD-10-CM | POA: Insufficient documentation

## 2014-07-26 DIAGNOSIS — Y9389 Activity, other specified: Secondary | ICD-10-CM | POA: Insufficient documentation

## 2014-07-26 DIAGNOSIS — R4689 Other symptoms and signs involving appearance and behavior: Secondary | ICD-10-CM

## 2014-07-26 DIAGNOSIS — F840 Autistic disorder: Secondary | ICD-10-CM | POA: Insufficient documentation

## 2014-07-26 DIAGNOSIS — F99 Mental disorder, not otherwise specified: Secondary | ICD-10-CM | POA: Diagnosis present

## 2014-07-26 DIAGNOSIS — Y998 Other external cause status: Secondary | ICD-10-CM | POA: Diagnosis not present

## 2014-07-26 DIAGNOSIS — Z23 Encounter for immunization: Secondary | ICD-10-CM | POA: Diagnosis not present

## 2014-07-26 HISTORY — DX: Autistic disorder: F84.0

## 2014-07-26 LAB — COMPREHENSIVE METABOLIC PANEL
ALBUMIN: 4.5 g/dL (ref 3.5–5.0)
ALT: 26 U/L (ref 17–63)
AST: 44 U/L — AB (ref 15–41)
Alkaline Phosphatase: 86 U/L (ref 38–126)
Anion gap: 8 (ref 5–15)
BUN: 18 mg/dL (ref 6–20)
CALCIUM: 8.8 mg/dL — AB (ref 8.9–10.3)
CO2: 23 mmol/L (ref 22–32)
Chloride: 107 mmol/L (ref 101–111)
Creatinine, Ser: 0.71 mg/dL (ref 0.61–1.24)
GFR calc Af Amer: >60 mL/min (ref >60)
Glucose, Bld: 103 mg/dL — ABNORMAL HIGH (ref 65–99)
Potassium: 3.7 mmol/L (ref 3.5–5.1)
SODIUM: 138 mmol/L (ref 135–145)
Total Bilirubin: 0.5 mg/dL (ref 0.3–1.2)
Total Protein: 7.7 g/dL (ref 6.5–8.1)

## 2014-07-26 LAB — URINALYSIS COMPLETE WITH MICROSCOPIC (ARMC ONLY)
Bilirubin Urine: NEGATIVE
GLUCOSE, UA: NEGATIVE mg/dL
Hgb urine dipstick: NEGATIVE
KETONES UR: NEGATIVE mg/dL
LEUKOCYTES UA: NEGATIVE
Nitrite: NEGATIVE
PH: 7 (ref 5.0–8.0)
Protein, ur: NEGATIVE mg/dL
SQUAMOUS EPITHELIAL / LPF: NONE SEEN
Specific Gravity, Urine: 1.023 (ref 1.005–1.030)

## 2014-07-26 LAB — CBC
HCT: 45 % (ref 40.0–52.0)
Hemoglobin: 14.5 g/dL (ref 13.0–18.0)
MCH: 25.9 pg — ABNORMAL LOW (ref 26.0–34.0)
MCHC: 32.3 g/dL (ref 32.0–36.0)
MCV: 80.2 fL (ref 80.0–100.0)
Platelets: 265 10*3/uL (ref 150–440)
RBC: 5.6 MIL/uL (ref 4.40–5.90)
RDW: 16 % — ABNORMAL HIGH (ref 11.5–14.5)
WBC: 8.5 10*3/uL (ref 3.8–10.6)

## 2014-07-26 LAB — ACETAMINOPHEN LEVEL: Acetaminophen (Tylenol), Serum: <10 ug/mL — ABNORMAL LOW (ref 10–30)

## 2014-07-26 LAB — ETHANOL: Alcohol, Ethyl (B): <5 mg/dL (ref <5)

## 2014-07-26 LAB — SALICYLATE LEVEL

## 2014-07-26 MED ORDER — TETANUS-DIPHTHERIA TOXOIDS TD 5-2 LFU IM INJ
0.5000 mL | INJECTION | Freq: Once | INTRAMUSCULAR | Status: AC
  Administered 2014-07-26: 0.5 mL via INTRAMUSCULAR

## 2014-07-26 MED ORDER — TETANUS-DIPHTHERIA TOXOIDS TD 5-2 LFU IM INJ
INJECTION | INTRAMUSCULAR | Status: AC
Start: 2014-07-26 — End: 2014-07-26
  Administered 2014-07-26: 0.5 mL via INTRAMUSCULAR
  Filled 2014-07-26: qty 0.5

## 2014-07-26 NOTE — Consult Note (Signed)
Psychiatry: This patient is medically and psychiatrically stable and safe for outpatient discharge. Not acutely hostile or threatening.  Electronic Signatures: Adyn Hoes, Jackquline DenmarkJohn T (MD)  (Signed on 19-Apr-16 10:03)  Authored  Last Updated: 19-Apr-16 10:03 by Audery Amellapacs, Lizania Bouchard T (MD)
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
## 2014-07-26 NOTE — Consult Note (Signed)
Psychiatry: Follow-up for this 24 year old man with mental retardation and autism.  Patient has no new complaints.  On review of systems he has no physical complaints.  No mental complaints no psychiatric complaints nothing change. mental status patient is neatly groomed.  Generally cooperative.  Intermittent eye contact psychomotor activity fidgety.  Speech is decreased in total amount and somewhat telegraphic which is normal for him.  Affect a little confused.  Denies suicidal or homicidal ideation.  Denies hallucinations.  Clearly cognitively impaired. plan involves discharge to an appropriate and safe group home setting however there is a hang-up because the state needs to find the funding to pay for more one-on-one contact.  They will only be able to do that at a different group home and where he was living so new group home must be located.  Continue current treatment pending discharge.  No benefit to admitting him to psychiatry.  Electronic Signatures: Robertine Kipper, Jackquline DenmarkJohn T (MD)  (Signed on 05-Apr-16 16:19)  Authored  Last Updated: 05-Apr-16 16:19 by Audery Amellapacs, Holston Oyama T (MD)
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
## 2014-07-26 NOTE — Consult Note (Signed)
Psychiatry: Patient seen. 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 No current violence or threats. Cooperative with treatment. We continue to work on tryingh to find placement. No change to medication  Electronic Signatures: Clapacs, Jackquline DenmarkJohn T (MD)  (Signed on 19-Apr-16 20:51)  Authored  Last Updated: 19-Apr-16 20:51 by Audery Amellapacs, John T (MD)
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
## 2014-07-26 NOTE — ED Notes (Signed)
BEHAVIORAL HEALTH ROUNDING Patient sleeping: No. Patient alert and oriented: yes Behavior appropriate: Yes.  ; If no, describe:  Nutrition and fluids offered: Yes  Toileting and hygiene offered: Yes  Sitter present: not applicable Law enforcement present: ODS 
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
## 2014-07-26 NOTE — Consult Note (Signed)
History of Present Illness:  History of Present Illness Pt seen for follow up in [facility]. he was lying in bed and talking about his group home. Did not show any behavioral problems or aggressive behavior. No mood anger or anxiety noted. No SI /HI noted.   Target Symptoms:  Behavior coopertive   PAST MEDICAL & SURGICAL HX:  Significant Events:   Anxiety:    Autism spectrum disorder -:   CURRENT OUTPATIENT MEDICATIONS:  Home Medications: Medication Instructions Status  zonisamide 100 mg oral capsule 1 cap(s) orally 2 times a day Active  Zoloft 100 mg oral tablet 1 tab(s) orally once a day Active  Keppra 750 mg oral tablet 1 tab(s) orally 2 times a day Active  TEGretol XR 200 mg oral tablet, extended release 4 tab(s) orally 2 times a day Active  metoprolol tartrate 25 mg oral tablet 1 tab(s) orally 3 times a day Active  Ativan 0.5 mg oral tablet 1 tab(s) orally 3 times a day, As Needed - for Agitation Active   Mental Status Exam:  Speech Fluent   Affect congruent   Thought Processes Linear/logical/goal directed   Orientation Place  Time  Person   Attention Oriented   Fund of Knowledge Fair   Language Fair   Judgement Fair   Insight Fair   Reliabiity Fair   Suicide Risk Assessment: Suicide Risk Level No risk inidicated.  Review of Systems:  Review of Systems:  Medications/Allergies Reviewed Medications/Allergies reviewed   NURSING FLOWSHEETS:  Vital Signs/Nurse Notes-CM: ED Vital Sign Flow Sheet:   04-Apr-16 08:17  Temp Temperature 97.6  Temp Source oral  Pulse Pulse 93  Respirations Respirations 18  SBP SBP 119  DBP DBP 57  Pulse Ox % Pulse Ox % 95  Pulse Ox Source Source Room Air  Pain Scale (0-10) Pain Scale (0-10) Scale:0   Assessment & Diagnosis: Axis I: Autism Spectrum Disorder.   Axis II: Mental Retardation.  Treatment Plan: Pt will be discharged back to group home I have discontinued his IVC He will continue on the same  meds.  Electronic Signatures: Rhunette CroftFaheem, Torien Ramroop S (MD)  (Signed 04-Apr-16 12:06)  Authored: History of Present Illness, Target Symptoms, PAST MEDICAL & SURGICAL HX, CURRENT OUTPATIENT MEDICATIONS, Mental Status Exam, Suicide Risk Assessment, Review of Systems, NURSING FLOWSHEETS, Assessment & Diagnosis, Treatment Plan   Last Updated: 04-Apr-16 12:06 by Rhunette CroftFaheem, Armando Bukhari S (MD)

## 2014-07-26 NOTE — Consult Note (Signed)
Psychiatry: Patient seen. Young man with schizoaffective disorder, autism and MR. Remains disorganized and hard to understand but has not been violent or threatening. We are still working oon placement. No change to medication. Patient encouraged to be patient.  Electronic Signatures: Hideo Googe, Jackquline DenmarkJohn T (MD)  (Signed on 17-Mar-16 12:53)  Authored  Last Updated: 17-Mar-16 12:53 by Audery Amellapacs, Raylen Tangonan T (MD)

## 2014-07-26 NOTE — Consult Note (Signed)
Psychiatry: Follow up. No new complaint. Affect flat. Thoughts a little confused.Poor insight and poor understnding of placement process. Not violent or aggressive. No change to treatment plan. Await placement  Electronic Signatures: Taijah Macrae, Jackquline DenmarkJohn T (MD)  (Signed on 08-Apr-16 23:19)  Authored  Last Updated: 08-Apr-16 23:19 by Audery Amellapacs, Catherine Oak T (MD)

## 2014-07-26 NOTE — ED Notes (Signed)
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
## 2014-07-26 NOTE — Consult Note (Signed)
PSychiatry: No new complaints. No behavior problems or changes. Calm and no mental state changes. No change to treatment. We are working oon finding a discharge plan.  Electronic Signatures: Clapacs, Jackquline DenmarkJohn T (MD)  (Signed on 12-Apr-16 17:28)  Authored  Last Updated: 12-Apr-16 17:28 by Audery Amellapacs, John T (MD)

## 2014-07-26 NOTE — ED Notes (Signed)
TTS in room to assess pt. Pt being calm and cooperative at this time.
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
## 2014-07-26 NOTE — ED Notes (Signed)
BEHAVIORAL HEALTH ROUNDING Patient sleeping: Yes.   Patient alert and oriented: yes Behavior appropriate: Yes.  ; If no, describe:  Nutrition and fluids offered: Yes  Toileting and hygiene offered: No and Pt just arrived to ED Sitter present: yes Law enforcement present: Yes ODS
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
## 2014-07-26 NOTE — Consult Note (Signed)
PATIENT NAME:  Brendan PhlegmLAY, Brendan Gamble MR#:  [id number] DATE OF BIRTH:  02/20/1991  DATE OF CONSULTATION:  06/13/2014  REFERRING PHYSICIAN:   CONSULTING PHYSICIAN:  Jerald Villalona K. Dathan Attia, MD  SUBJECTIVE: The patient was seen in [facility]  The patient is a 24 year old Hispanic male who has been living at a group home and is  too hard to manage because of his agitation and a long history of mental illness, which includes ADHD and autism. The patient had been stabilized on medications and he became agitated because of stressors applied. The staff reports that he had been calm and stable. Social services reports that he cannot go back to the same group home, so they found a different place for him to go.   OBJECTIVE:  GENERAL: The patient was seen lying in bed, alert and oriented, calm and cooperative, no agitation. Affect and mood: Stable. He does not appear to be responding to inner stimuli. Denies any active suicidal or homicidal plans. Insight and judgment: Fair. Impulse control is fair.  IMPRESSION: Attention hyperactivity disorder, childhood onset with hyperactivity and attention problems, with autism spectrum disorder, generalized anxiety disorder, history of seizure disorder.   PLAN: Discontinue involuntary commitment and discharge the patient back to group home. He has enough medications at home and will be followed by the psychiatrist at the group home.      ____________________________ Jannet MantisSurya K. Guss [place], MD skc:mw D: 06/13/2014 16:29:11 ET T: 06/13/2014 17:18:42 ET JOB#: [id number]  cc: Monika SalkSurya K. Guss [place]undeh[place], MD, <Dictator> Beau FannySURYA K Lilliemae Fruge MD ELECTRONICALLY SIGNED 06/14/2014 11:16

## 2014-07-26 NOTE — Consult Note (Signed)
PSychiatry: PAtient seen . No new complaints. No clinical change. Not behaving in a disruptive or aggressive manner. Placement pending finding appropriate safe disposition.  Electronic Signatures: Kycen Spalla, Jackquline DenmarkJohn T (MD)  (Signed on 07-Apr-16 23:32)  Authored  Last Updated: 07-Apr-16 23:32 by Audery Amellapacs, Traves Majchrzak T (MD)

## 2014-07-26 NOTE — Consult Note (Signed)
PAtient with schizophrenia and autistic disorder. No new behavior problems or complaints. Tolerating medication. Awaiting placement.  Electronic Signatures: Clapacs, Jackquline DenmarkJohn T (MD)  (Signed on 21-Apr-16 21:30)  Authored  Last Updated: 21-Apr-16 21:30 by Audery Amellapacs, John T (MD)

## 2014-07-27 LAB — URINE DRUG SCREEN, QUALITATIVE (ARMC ONLY)
Amphetamines, Ur Screen: NOT DETECTED
BARBITURATES, UR SCREEN: NOT DETECTED
Benzodiazepine, Ur Scrn: NOT DETECTED
CANNABINOID 50 NG, UR ~~LOC~~: NOT DETECTED
COCAINE METABOLITE, UR ~~LOC~~: NOT DETECTED
MDMA (ECSTASY) UR SCREEN: NOT DETECTED
Methadone Scn, Ur: NOT DETECTED
OPIATE, UR SCREEN: NOT DETECTED
Phencyclidine (PCP) Ur S: NOT DETECTED
Tricyclic, Ur Screen: NOT DETECTED

## 2014-07-27 MED ORDER — LEVETIRACETAM 750 MG PO TABS
750.0000 mg | ORAL_TABLET | Freq: Two times a day (BID) | ORAL | Status: DC
  Administered 2014-07-27 – 2014-08-27 (×60): 750 mg via ORAL
  Administered 2014-08-28: 500 mg via ORAL
  Administered 2014-08-28 – 2014-09-18 (×41): 750 mg via ORAL
  Filled 2014-07-27 (×114): qty 1

## 2014-07-27 MED ORDER — LEVETIRACETAM 500 MG PO TABS
ORAL_TABLET | ORAL | Status: AC
  Filled 2014-07-27: qty 2

## 2014-07-27 MED ORDER — SERTRALINE HCL 100 MG PO TABS
100.0000 mg | ORAL_TABLET | Freq: Two times a day (BID) | ORAL | Status: DC
  Administered 2014-07-27 – 2014-08-08 (×25): 100 mg via ORAL
  Administered 2014-08-09: 10:00:00 via ORAL
  Administered 2014-08-09 – 2014-09-18 (×72): 100 mg via ORAL
  Filled 2014-07-27 (×18): qty 1

## 2014-07-27 MED ORDER — CARBAMAZEPINE ER 200 MG PO TB12
800.0000 mg | ORAL_TABLET | Freq: Two times a day (BID) | ORAL | Status: DC
  Administered 2014-07-27 – 2014-08-22 (×50): 800 mg via ORAL
  Filled 2014-07-27 (×36): qty 4
  Filled 2014-07-27: qty 2
  Filled 2014-07-27 (×3): qty 4
  Filled 2014-07-27: qty 2
  Filled 2014-07-27 (×28): qty 4

## 2014-07-27 MED ORDER — CARBAMAZEPINE ER 200 MG PO CP12: 800.0000 mg | ORAL_CAPSULE | Freq: Two times a day (BID) | ORAL | Status: DC

## 2014-07-27 MED ORDER — METOPROLOL TARTRATE 25 MG PO TABS
ORAL_TABLET | ORAL | Status: AC
  Filled 2014-07-27: qty 1

## 2014-07-27 MED ORDER — LEVETIRACETAM 500 MG PO TABS
ORAL_TABLET | ORAL | Status: AC
  Administered 2014-07-27: 750 mg via ORAL
  Filled 2014-07-27: qty 2

## 2014-07-27 MED ORDER — ZONISAMIDE 100 MG PO CAPS
100.0000 mg | ORAL_CAPSULE | Freq: Two times a day (BID) | ORAL | Status: DC
  Administered 2014-07-27 – 2014-09-18 (×99): 100 mg via ORAL
  Filled 2014-07-27 (×127): qty 1

## 2014-07-27 MED ORDER — CARBAMAZEPINE ER 100 MG PO CP12: 400.0000 mg | ORAL_CAPSULE | Freq: Two times a day (BID) | ORAL | Status: DC

## 2014-07-27 MED ORDER — LORAZEPAM 0.5 MG PO TABS
0.5000 mg | ORAL_TABLET | Freq: Three times a day (TID) | ORAL | Status: DC | PRN
  Administered 2014-07-27 – 2014-09-12 (×19): 0.5 mg via ORAL
  Filled 2014-07-27 (×10): qty 1

## 2014-07-27 MED ORDER — SERTRALINE HCL 100 MG PO TABS
ORAL_TABLET | ORAL | Status: AC
  Filled 2014-07-27: qty 1

## 2014-07-27 MED ORDER — METOPROLOL TARTRATE 25 MG PO TABS
25.0000 mg | ORAL_TABLET | Freq: Three times a day (TID) | ORAL | Status: DC
  Administered 2014-07-27 – 2014-08-05 (×17): 25 mg via ORAL
  Filled 2014-07-27 (×12): qty 1

## 2014-07-27 NOTE — ED Notes (Signed)
BEHAVIORAL HEALTH ROUNDING Patient sleeping: No. Patient alert and oriented: no Behavior appropriate: Yes.  ; If no, describe:   Nutrition and fluids offered: Yes  Toileting and hygiene offered: Yes  Sitter present: no Law enforcement present: Yes  and ODS  

## 2014-07-27 NOTE — ED Notes (Addendum)
Patient assigned to appropriate care area. Patient oriented to unit/care area: Informed that, for their safety, care areas are designed for safety and monitored by security cameras at all times; and visiting hours explained to patient. Patient verbalizes understanding, and verbal contract for safety obtained. Pt wanded by ODS for safety. 

## 2014-07-27 NOTE — ED Notes (Signed)
BEHAVIORAL HEALTH ROUNDING Patient sleeping: Yes.   Patient alert and oriented: no Behavior appropriate: Yes.  ; If no, describe:   Nutrition and fluids offered: Yes  Toileting and hygiene offered: Yes  Sitter present: no Law enforcement present: Yes  and ODS

## 2014-07-27 NOTE — ED Notes (Signed)

## 2014-07-27 NOTE — ED Notes (Signed)
ENVIRONMENTAL ASSESSMENT Potentially harmful objects out of patient reach: yes Personal belongings secured: yes Patient dressed in hospital provided attire only: yes Plastic bags out of patient reach: yes Patient care equipment (cords, cables, call bells, lines, and drains) shortened, removed, or accounted for: none present Equipment and supplies removed from bottom of stretcher: n/a no stretcher  Potentially toxic materials out of patient reach: yes Sharps container removed or out of patient reach: yes 

## 2014-07-27 NOTE — ED Notes (Signed)
Pt reports wanting to sleep, lights turned off, pt covered with blankets

## 2014-07-27 NOTE — ED Notes (Signed)
BEHAVIORAL HEALTH ROUNDING Patient sleeping: no Patient alert and oriented: yes Behavior appropriate: yes Nutrition and fluids offered: yes Toileting and hygiene offered: yes Sitter present: no Law enforcement present: yes, ODS 

## 2014-07-27 NOTE — ED Notes (Signed)
ED BHU PLACEMENT JUSTIFICATION Is the patient under IVC or is there intent for IVC: Yes.   Is the patient medically cleared: Yes.   Is there vacancy in the ED BHU: Yes.   Is the population mix appropriate for patient: Yes.   Is the patient awaiting placement in inpatient or outpatient setting: Yes.   Has the patient had a psychiatric consult: No. Survey of unit performed for contraband, proper placement and condition of furniture, tampering with fixtures in bathroom, shower, and each patient room: Yes.  ; Findings: all clear APPEARANCE/BEHAVIOR calm and cooperative NEURO ASSESSMENT Orientation: time, place and person Hallucinations: No.None noted (Hallucinations) Speech: Normal and Volume:soft Gait: normal RESPIRATORY ASSESSMENT Normal expansion.  Clear to auscultation.  No rales, rhonchi, or wheezing. CARDIOVASCULAR ASSESSMENT regular rate and rhythm, S1, S2 normal, no murmur, click, rub or gallop GASTROINTESTINAL ASSESSMENT soft, nontender, BS WNL, no r/g EXTREMITIES no deformities PLAN OF CARE Provide calm/safe environment. Vital signs assessed twice daily. ED BHU Assessment once each 12-hour shift. Collaborate with intake RN daily or as condition indicates. Assure the ED provider has rounded once each shift. Provide and encourage hygiene. Provide redirection as needed. Assess for escalating behavior; address immediately and inform ED provider.  Assess family dynamic and appropriateness for visitation as needed: No.; If necessary, describe findings: n/a Educate the patient/family about BHU procedures/visitation: Yes.  ; If necessary, describe findings:

## 2014-07-27 NOTE — ED Notes (Signed)
[place] from [place], [place]

## 2014-07-27 NOTE — ED Notes (Signed)
ED BHU PLACEMENT JUSTIFICATION Is the patient under IVC or is there intent for IVC: yes Is the patient medically cleared: yes Is there vacancy in the ED BHU:yes Is the population mix appropriate for patient: yes Is the patient awaiting placement in inpatient or outpatient setting: yes Has the patient had a psychiatric consult: yes Survey of unit performed for contraband, proper placement and condition of furniture, tampering with fixtures in bathroom, shower, and each patient room: yes, no findings APPEARANCE/BEHAVIOR Cooperative, appears to be a little energized as pt is up to the window multiple times and tapping in a rhythmic motion NEURO ASSESSMENT Orientation: alert and oriented x 3 Hallucinations: no Speech: normal, clear Gait: steady RESPIRATORY ASSESSMENT No resp distress CARDIOVASCULAR ASSESSMENT Regular rate GASTROINTESTINAL ASSESSMENT wnl EXTREMITIES wnl PLAN OF CARE Provide calm/safe environment. Vital signs assessed twice daily. ED BHU Assessment once each 12-hour shift. Collaborate with intake RN daily or as condition indicates. Assure the ED provider has rounded once each shift. Provide and encourage hygiene. Provide redirection as needed. Assess for escalating behavior; address immediately and inform ED provider.  Assess family dynamic and appropriateness for visitation as needed: yes Educate the patient/family about BHU procedures/visitation: yes

## 2014-07-27 NOTE — ED Notes (Signed)
BEHAVIORAL HEALTH ROUNDING Patient sleeping: no Patient alert and oriented: yes Behavior appropriate: yes Nutrition and fluids offered: yes Toileting and hygiene offered: yes Sitter present: no Law enforcement present: yes, ODS Rich 

## 2014-07-27 NOTE — ED Notes (Signed)
BEHAVIORAL HEALTH ROUNDING Patient sleeping: Yes.   Patient alert and oriented: not applicable Behavior appropriate: Yes.  ; If no, describe:   Nutrition and fluids offered: No Toileting and hygiene offered: No Sitter present: not applicable Law enforcement present: Yes  and ODS  

## 2014-07-27 NOTE — ED Notes (Signed)
Pt to toilet; returned to bed

## 2014-07-27 NOTE — ED Notes (Signed)
ENVIRONMENTAL ASSESSMENT Potentially harmful objects out of patient reach: yes Personal belongings secured: yes Patient dressed in hospital provided attire only: yes Plastic bags out of patient reach: yes Patient care equipment (cords, cables, call bells, lines, and drains) shortened, removed, or accounted for: none present Equipment and supplies removed from bottom of stretcher: n/a, no stretcher Potentially toxic materials out of patient reach: yes Sharps container removed or out of patient reach: yes 

## 2014-07-27 NOTE — ED Notes (Addendum)
Pt to toilet, then to nurse station to inquire about going home; [facility] and [facility] to bed

## 2014-07-27 NOTE — ED Notes (Signed)
Copies of labwork/urine drug screen attached to hard copy of chart. MD Goodman made aware and has reviewed drug screen results. Still pending translation into epic. 
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
## 2014-07-27 NOTE — ED Notes (Signed)
This RN Lab called multiple times about unresulted drug screen. Lab states that drug screen is resulting, but is "not crossing over to epic". Will continue to follow. Results sent via paper from lab. 

## 2014-07-27 NOTE — ED Notes (Signed)
Dr. Toni Amendlapacs in room assessing pt. Pt alert and oriented x 3, resps even and unlabored, speaking in complete coherent sentences, pt in NAD, monitored by security cameras.

## 2014-07-27 NOTE — ED Provider Notes (Signed)
-----------------------------------------   7:29 AM on 07/27/2014 -----------------------------------------   BP 106/70 mmHg  Pulse 92  Temp(Src) 99.4 F (37.4 C) (Oral)  Resp 20  Ht 5\' 8"  (1.727 m)  Wt 150 lb (68.04 kg)  BMI 22.81 kg/m2  SpO2 99%  The patient had no acute events overnight.  Calm and cooperative at this time.  Disposition is pending per Psychiatry/Behavioral Medicine team recommendations.     Irean HongJade J Kamdon Reisig, MD 07/27/14 [phone]

## 2014-07-27 NOTE — ED Notes (Signed)
BEHAVIORAL HEALTH ROUNDING Patient sleeping: no Patient alert and oriented: yes Behavior appropriate: yes Nutrition and fluids offered: yes Toileting and hygiene offered: yes Sitter present: no Law enforcement present: yes 

## 2014-07-27 NOTE — ED Notes (Signed)
Pt report received from Ermalene SearingLauren S RN. Pt care assumed. Pt sitting in day room watching tv a this time.

## 2014-07-27 NOTE — ED Notes (Signed)
Patient had shower 

## 2014-07-27 NOTE — ED Notes (Signed)
Pt to toilet and returned to bed 

## 2014-07-27 NOTE — ED Notes (Signed)
Patient in room eating lunch at this time.

## 2014-07-27 NOTE — ED Notes (Signed)
Pt's care given to this RN from Noel, RN. 

## 2014-07-27 NOTE — ED Notes (Signed)
Report to Danelle EarthlyNoel, Charity fundraiserN. Pt moved to ED BHU

## 2014-07-27 NOTE — ED Notes (Signed)
BEHAVIORAL HEALTH ROUNDING Patient sleeping: No. Patient alert and oriented: yes Behavior appropriate: No.; If no, describe: hitting bed with fist at this time. Pt states he is upset because he knows he will miss his class in the morning.  Nutrition and fluids offered: Yes  and No Toileting and hygiene offered: Yes  Sitter present: not applicable Law enforcement present: Yes ODS

## 2014-07-28 DIAGNOSIS — F840 Autistic disorder: Secondary | ICD-10-CM | POA: Insufficient documentation

## 2014-07-28 MED ORDER — LEVETIRACETAM 500 MG PO TABS
ORAL_TABLET | ORAL | Status: AC
  Administered 2014-07-28: 22:00:00
  Filled 2014-07-28: qty 2

## 2014-07-28 MED ORDER — METOPROLOL TARTRATE 25 MG PO TABS
ORAL_TABLET | ORAL | Status: AC
  Administered 2014-07-28: 22:00:00
  Filled 2014-07-28: qty 1

## 2014-07-28 MED ORDER — SERTRALINE HCL 100 MG PO TABS
ORAL_TABLET | ORAL | Status: AC
  Administered 2014-07-28: 22:00:00
  Filled 2014-07-28: qty 1

## 2014-07-28 NOTE — ED Notes (Signed)

## 2014-07-28 NOTE — ED Provider Notes (Signed)
-----------------------------------------   6:32 AM on 07/28/2014 -----------------------------------------   BP 125/55 mmHg  Pulse 79  Temp(Src) 98.3 F (36.8 C) (Oral)  Resp 20  Ht 5\' 8"  (1.727 m)  Wt 150 lb (68.04 kg)  BMI 22.81 kg/m2  SpO2 97%  The patient had no acute events overnight.  Calm and cooperative at this time.  Disposition is pending per Psychiatry/Behavioral Medicine team recommendations.     Irean HongJade J Tramain Gershman, MD 07/28/14 [phone]

## 2014-07-28 NOTE — ED Notes (Signed)
Pt lying in bed  NAD observed  

## 2014-07-28 NOTE — ED Notes (Signed)
Pt resting in bed with eyes closed. No unusual behavior observed. Pt has no needs or concerns at this time. Will continue to monitor and f/u as needed.  

## 2014-07-28 NOTE — ED Notes (Signed)
Lunch provided.

## 2014-07-28 NOTE — ED Notes (Signed)
meds administered as ordered  Assessment completed   

## 2014-07-28 NOTE — ED Notes (Signed)
Pt. Noted in day room. No complaints or concerns voiced. No distress or abnormal behavior noted. Will continue to monitor with security cameras. Q 15 minute rounds continue. 

## 2014-07-28 NOTE — ED Notes (Signed)
Report received from Amy Teague RN. Pt. Alert and oriented in no distress denies SI, HI, AVH and pain.  Pt. Instructed to come to me with problems or concerns.Will continue to monitor for safety via security cameras and Q 15 minute checks.  

## 2014-07-28 NOTE — ED Notes (Signed)
BEHAVIORAL HEALTH ROUNDING Patient sleeping: No. Patient alert and oriented: yes Behavior appropriate: Yes.  ; If no, describe:  Nutrition and fluids offered: Yes  Toileting and hygiene offered: Yes  Sitter present: yes  q15minute checks Law enforcement present: yes  ODS 

## 2014-07-28 NOTE — ED Notes (Signed)
BEHAVIORAL HEALTH ROUNDING Patient sleeping: Yes.   Patient alert and oriented: asleep Behavior appropriate: Yes.  ; If no, describe:  Nutrition and fluids offered: asleep Toileting and hygiene offered: asleep Sitter present: no Law enforcement present: Yes, ODS 

## 2014-07-28 NOTE — ED Notes (Signed)
Supper provided  

## 2014-07-28 NOTE — ED Notes (Signed)
Sandwich and soft drink given.  

## 2014-07-28 NOTE — ED Notes (Signed)
ED BHU PLACEMENT JUSTIFICATION Is the patient under IVC or is there intent for IVC: Yes.   Is the patient medically cleared: Yes.   Is there vacancy in the ED BHU: Yes.   Is the population mix appropriate for patient: Yes.   Is the patient awaiting placement in inpatient or outpatient setting: Yes.   Has the patient had a psychiatric consult: Yes.   Survey of unit performed for contraband, proper placement and condition of furniture, tampering with fixtures in bathroom, shower, and each patient room: Yes.  ; Findings:  APPEARANCE/BEHAVIOR calm NEURO ASSESSMENT Orientation: time, person, place Hallucinations: No.None noted (Hallucinations) Speech: Normal Gait: normal RESPIRATORY ASSESSMENT Breathing Pattern: even and unlabored  CARDIOVASCULAR ASSESSMENT regular rate and rhythm, S1, S2 normal, no murmur, click, rub or gallop and heart rate wnl, skin warm and dry, color wnl.  GASTROINTESTINAL ASSESSMENT wnl EXTREMITIES Moves all extremities  PLAN OF CARE Provide calm/safe environment. Vital signs assessed twice daily. ED BHU Assessment once each 12-hour shift. Collaborate with intake RN daily or as condition indicates. Assure the ED provider has rounded once each shift. Provide and encourage hygiene. Provide redirection as needed. Assess for escalating behavior; address immediately and inform ED provider.  Assess family dynamic and appropriateness for visitation as needed: Yes.  ; If necessary, describe findings:  Educate the patient/family about BHU procedures/visitation: Yes.  ; If necessary, describe findings:

## 2014-07-28 NOTE — ED Notes (Signed)
Pt provided with sandwich tray and drink. Pt ate all sandwich tray and drink.

## 2014-07-28 NOTE — ED Notes (Signed)
Pt. Noted in room. No complaints or concerns voiced. No distress or abnormal behavior noted. Will continue to monitor with security cameras. Q 15 minute rounds continue. 

## 2014-07-28 NOTE — ED Notes (Signed)
Breakfast provided.

## 2014-07-28 NOTE — ED Notes (Signed)
BEHAVIORAL HEALTH ROUNDING Patient sleeping: Yes.   Patient alert and oriented: asleep Behavior appropriate: Yes.  ; If no, describe:  Nutrition and fluids offered: asleep Toileting and hygiene offered: asleep Sitter present: asleep Law enforcement present: Yes, ODS 

## 2014-07-28 NOTE — ED Notes (Signed)
BEHAVIORAL HEALTH ROUNDING Patient sleeping: Yes.   Patient alert and oriented: sleeping Behavior appropriate: Yes.  ; If no, describe:  Nutrition and fluids offered: Yes  Toileting and hygiene offered: Yes  Sitter present: yes  q15 minute checks Law enforcement present: yes  ODS 

## 2014-07-28 NOTE — ED Notes (Signed)
BEHAVIORAL HEALTH ROUNDING Patient sleeping: Yes.   Patient alert and oriented: asleep Behavior appropriate: Yes.  ; If no, describe:  Nutrition and fluids offered: asleep Toileting and hygiene offered: aslep Sitter present: asleep Law enforcement present: Yes, ODS

## 2014-07-28 NOTE — ED Notes (Signed)
Pt is sitting in the dayroom in a recliner   NAD observed  Pt with no verbalized needs or complaints at this time

## 2014-07-28 NOTE — ED Notes (Signed)
Pt lying in bed  - appears asleep   NAD observed

## 2014-07-28 NOTE — ED Notes (Signed)
Pt sleeping at this time  Even, non labored respirations observed
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
## 2014-07-28 NOTE — ED Notes (Signed)
BEHAVIORAL HEALTH ROUNDING Patient sleeping: No. Patient alert and oriented: yes Behavior appropriate: Yes.  ; If no, describe:  Nutrition and fluids offered: Yes  Toileting and hygiene offered: Yes  Sitter present: no Law enforcement present: Yes, ODS 

## 2014-07-28 NOTE — ED Notes (Signed)
ED BHU PLACEMENT JUSTIFICATION Is the patient under IVC or is there intent for IVC: Yes.   Is the patient medically cleared: Yes.   Is there vacancy in the ED BHU: Yes.   Is the population mix appropriate for patient: Yes.   Is the patient awaiting placement in inpatient or outpatient setting: Yes.   Has the patient had a psychiatric consult: Yes.   Survey of unit performed for contraband, proper placement and condition of furniture, tampering with fixtures in bathroom, shower, and each patient room: Yes.  ; Findings:  APPEARANCE/BEHAVIOR cooperative NEURO ASSESSMENT Orientation: orientd x3 Hallucinations: No.None noted (Hallucinations) Speech: Normal Gait: normal RESPIRATORY ASSESSMENT Normal expansion.  Clear to auscultation.  No rales, rhonchi, or wheezing. CARDIOVASCULAR ASSESSMENT regular rate and rhythm, S1, S2 normal, no murmur, click, rub or gallop and pulse regular and equal  skin warm and dry GASTROINTESTINAL ASSESSMENT no GI complaint EXTREMITIES Full ROM PLAN OF CARE Provide calm/safe environment. Vital signs assessed twice daily. ED BHU Assessment once each 12-hour shift. Collaborate with intake RN daily or as condition indicates. Assure the ED provider has rounded once each shift. Provide and encourage hygiene. Provide redirection as needed. Assess for escalating behavior; address immediately and inform ED provider.  Assess family dynamic and appropriateness for visitation as needed: Yes.  ; If necessary, describe findings:  Educate the patient/family about BHU procedures/visitation: Yes.  ; If necessary, describe findings:

## 2014-07-28 NOTE — ED Notes (Signed)
ENVIRONMENTAL ASSESSMENT Potentially harmful objects out of patient reach: Yes.   Personal belongings secured: Yes.   Patient dressed in hospital provided attire only: Yes.   Plastic bags out of patient reach: Yes.   Patient care equipment (cords, cables, call bells, lines, and drains) shortened, removed, or accounted for: No. Equipment and supplies removed from bottom of stretcher: Yes.   Potentially toxic materials out of patient reach: Yes.   Sharps container removed or out of patient reach: Yes.   

## 2014-07-29 MED ORDER — LEVETIRACETAM 500 MG PO TABS
ORAL_TABLET | ORAL | Status: AC
  Administered 2014-07-29: 21:00:00
  Filled 2014-07-29: qty 2

## 2014-07-29 MED ORDER — CARBAMAZEPINE 200 MG PO TABS
ORAL_TABLET | ORAL | Status: AC
  Administered 2014-07-29: 21:00:00
  Filled 2014-07-29: qty 4

## 2014-07-29 MED ORDER — DEXTROSE 5 % IV SOLN
INTRAVENOUS | Status: AC
  Filled 2014-07-29: qty 10

## 2014-07-29 NOTE — ED Notes (Signed)
BEHAVIORAL HEALTH ROUNDING Patient sleeping: No. Patient alert and oriented: not applicable Behavior appropriate: Yes.  ; If no, describe:  Nutrition and fluids offered: No Toileting and hygiene offered: No Sitter present: not applicable Law enforcement present: Yes  

## 2014-07-29 NOTE — ED Notes (Signed)
Pt. Noted sleeping in room. No complaints or concerns voiced. No distress or abnormal behavior noted. Will continue to monitor with security cameras. Q 15 minute rounds continue. 

## 2014-07-29 NOTE — ED Notes (Signed)
240ml
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
## 2014-07-29 NOTE — ED Notes (Signed)
Lunch 100%.  240 ml.

## 2014-07-29 NOTE — ED Notes (Signed)
BEHAVIORAL HEALTH ROUNDING Patient sleeping: Yes.   Patient alert and oriented: yes Behavior appropriate: Yes.  ; If no, describe:  Nutrition and fluids offered: Yes  Toileting and hygiene offered: Yes  Sitter present: no Law enforcement present: Yes ODS 

## 2014-07-29 NOTE — ED Notes (Signed)
BEHAVIORAL HEALTH ROUNDING Patient sleeping: Yes.   Patient alert and oriented: yes Behavior appropriate: Yes.  ; If no, describe: na Nutrition and fluids offered: Yes  Toileting and hygiene offered: Yes   Sitter present: no Law enforcement present: Yes  

## 2014-07-29 NOTE — ED Notes (Signed)

## 2014-07-29 NOTE — ED Notes (Signed)
RN asked pt how he was feeling.  Pt said he was "not good."  When asked what the problem was pt said he was sad because he didn't want to miss his [employer] day.  RN asked when this was to happen and pt said tomorrow. RN told pt that we could discuss this whenever pt wanted.  Pt said OK.

## 2014-07-29 NOTE — ED Notes (Signed)
BEHAVIORAL HEALTH ROUNDING Patient sleeping: No. Patient alert and oriented: yes Behavior appropriate: Yes.  ; If no, describe:  Nutrition and fluids offered: Yes  Toileting and hygiene offered: Yes  Sitter present: not applicable Law enforcement present: Yes  

## 2014-07-29 NOTE — ED Notes (Signed)
ED BHU PLACEMENT JUSTIFICATION Is the patient under IVC or is there intent for IVC: Yes.   Is the patient medically cleared: Yes.   Is there vacancy in the ED BHU: Yes.   Is the population mix appropriate for patient: Yes.   Is the patient awaiting placement in inpatient or outpatient setting: Yes.   Has the patient had a psychiatric consult: Yes.   Survey of unit performed for contraband, proper placement and condition of furniture, tampering with fixtures in bathroom, shower, and each patient room: Yes.  ; Findings: na APPEARANCE/BEHAVIOR calm and cooperative NEURO ASSESSMENT Orientation: time, place and person Hallucinations: No.None noted (Hallucinations) Speech: Normal Gait: normal RESPIRATORY ASSESSMENT Normal expansion.  Clear to auscultation.  No rales, rhonchi, or wheezing. CARDIOVASCULAR ASSESSMENT regular rate and rhythm, S1, S2 normal, no murmur, click, rub or gallop GASTROINTESTINAL ASSESSMENT soft, nontender, BS WNL, no r/g EXTREMITIES normal strength, tone, and muscle mass PLAN OF CARE Provide calm/safe environment. Vital signs assessed twice daily. ED BHU Assessment once each 12-hour shift. Collaborate with intake RN daily or as condition indicates. Assure the ED provider has rounded once each shift. Provide and encourage hygiene. Provide redirection as needed. Assess for escalating behavior; address immediately and inform ED provider.  Assess family dynamic and appropriateness for visitation as needed: Yes.  ; If necessary, describe findings: na Educate the patient/family about BHU procedures/visitation: Yes.  ; If necessary, describe findings: na 

## 2014-07-29 NOTE — ED Notes (Signed)
Pt. Noted in room. No complaints or concerns voiced. No distress or abnormal behavior noted. Will continue to monitor with security cameras. Q 15 minute rounds continue. 

## 2014-07-29 NOTE — ED Provider Notes (Signed)
-----------------------------------------   2:27 AM on 07/29/2014 -----------------------------------------   BP 116/70 mmHg  Pulse 66  Temp(Src) 97.9 F (36.6 C) (Oral)  Resp 18  Ht 5\' 8"  (1.727 m)  Wt 150 lb (68.04 kg)  BMI 22.81 kg/m2  SpO2 96%  The patient had no acute events today.  Calm and cooperative at this time.  Disposition is pending per Psychiatry/Behavioral Medicine team recommendations.     Rebecka ApleyAllison P Desia Saban, MD 07/29/14 [phone]

## 2014-07-29 NOTE — ED Notes (Signed)
BEHAVIORAL HEALTH ROUNDING Patient sleeping: Yes.   Patient alert and oriented: not applicable Behavior appropriate: Yes.  ; If no, describe:  Nutrition and fluids offered: No Toileting and hygiene offered: No Sitter present: not applicable Law enforcement present: Yes  

## 2014-07-29 NOTE — ED Notes (Signed)
Dinner 100 %.  240 ml.

## 2014-07-29 NOTE — ED Notes (Signed)
Under his blanket, pt sitting in the dayroom.

## 2014-07-29 NOTE — ED Notes (Signed)
120 ml

## 2014-07-30 MED ORDER — SERTRALINE HCL 100 MG PO TABS
ORAL_TABLET | ORAL | Status: AC
  Filled 2014-07-30: qty 1

## 2014-07-30 MED ORDER — CARBAMAZEPINE 200 MG PO TABS
ORAL_TABLET | ORAL | Status: AC
  Filled 2014-07-30: qty 4

## 2014-07-30 NOTE — ED Notes (Signed)
ED BHU PLACEMENT JUSTIFICATION Is the patient under IVC or is there intent for IVC: Yes.   Is the patient medically cleared: Yes.   Is there vacancy in the ED BHU: Yes.   Is the population mix appropriate for patient: Yes.   Is the patient awaiting placement in inpatient or outpatient setting: Yes.   Has the patient had a psychiatric consult: Yes.   Survey of unit performed for contraband, proper placement and condition of furniture, tampering with fixtures in bathroom, shower, and each patient room: Yes.  ; Findings:  APPEARANCE/BEHAVIOR Cooperative   No unusual gestures NEURO ASSESSMENT Orientation: oriented x3 Hallucinations: No.None noted (Hallucinations) Speech: Normal Gait: normal RESPIRATORY ASSESSMENT: even, nonlabored respirations CARDIOVASCULAR ASSESSMENT regular rate and rhythm, S1, S2 normal, no murmur, click, rub or gallop and regular rate  pulses equal  skin warm and dry GASTROINTESTINAL ASSESSMENT no GI complaint EXTREMITIES Full ROM PLAN OF CARE Provide calm/safe environment. Vital signs assessed twice daily. ED BHU Assessment once each 12-hour shift. Collaborate with intake RN daily or as condition indicates. Assure the ED provider has rounded once each shift. Provide and encourage hygiene. Provide redirection as needed. Assess for escalating behavior; address immediately and inform ED provider.  Assess family dynamic and appropriateness for visitation as needed: Yes.  ; If necessary, describe findings:  Educate the patient/family about BHU procedures/visitation: Yes.  ; If necessary, describe findings:

## 2014-07-30 NOTE — ED Notes (Signed)
Supper provided  

## 2014-07-30 NOTE — ED Notes (Signed)
BEHAVIORAL HEALTH ROUNDING Patient sleeping: No. Patient alert and oriented: yes Behavior appropriate: Yes.  ; If no, describe:  Nutrition and fluids offered: Yes  Toileting and hygiene offered: Yes  Sitter present: yes  q15minute checks Law enforcement present: yes  ODS 

## 2014-07-30 NOTE — ED Notes (Signed)

## 2014-07-30 NOTE — ED Notes (Signed)
Pt lying in bed with his eyes closed   Even nonlabored respirations observed  Appears asleep  Continue to monitor

## 2014-07-30 NOTE — ED Notes (Signed)
Pt sitting in day room watching tv. No unusual behavior observed. Pt has no needs or concerns at this time.

## 2014-07-30 NOTE — ED Notes (Signed)
Pt lying in bed with his eyes closed  NAD observed  Continue to monitor 

## 2014-07-30 NOTE — ED Notes (Signed)
Pt lying in bed  NAD observed  He appears to be asleep

## 2014-07-30 NOTE — ED Notes (Signed)
Pt resting in bed with eyes closed. No unusual behavior observed. Pt has no needs or concerns at this time. Will continue to monitor and f/u as needed.  

## 2014-07-30 NOTE — ED Notes (Signed)
BEHAVIORAL HEALTH ROUNDING Patient sleeping: Yes.   Patient alert and oriented: asleep Behavior appropriate: Yes.  ; If no, describe:  Nutrition and fluids offered: asleep Toileting and hygiene offered: asleep Sitter present: no Law enforcement present: Yes, ODS 

## 2014-07-30 NOTE — ED Notes (Signed)
ED BHU PLACEMENT JUSTIFICATION Is the patient under IVC or is there intent for IVC: Yes.   Is the patient medically cleared: Yes.   Is there vacancy in the ED BHU: Yes.   Is the population mix appropriate for patient: Yes.   Is the patient awaiting placement in inpatient or outpatient setting: Yes.   Has the patient had a psychiatric consult: Yes.   Survey of unit performed for contraband, proper placement and condition of furniture, tampering with fixtures in bathroom, shower, and each patient room: Yes.  ; Findings:  APPEARANCE/BEHAVIOR cooperative NEURO ASSESSMENT Orientation: X 3 Hallucinations: No.None noted (Hallucinations) Speech: Normal Gait: normal RESPIRATORY ASSESSMENT Normal expansion.  Clear to auscultation.  No rales, rhonchi, or wheezing. CARDIOVASCULAR ASSESSMENT regular rate and rhythm, S1, S2 normal, no murmur, click, rub or gallop and heart tones audible, skin warm, dry, and nl color GASTROINTESTINAL ASSESSMENT wnl EXTREMITIES Moves all extremities PLAN OF CARE Provide calm/safe environment. Vital signs assessed twice daily. ED BHU Assessment once each 12-hour shift. Collaborate with intake RN daily or as condition indicates. Assure the ED provider has rounded once each shift. Provide and encourage hygiene. Provide redirection as needed. Assess for escalating behavior; address immediately and inform ED provider.  Assess family dynamic and appropriateness for visitation as needed: Yes.  ; If necessary, describe findings:  Educate the patient/family about BHU procedures/visitation: Yes.  ; If necessary, describe findings:

## 2014-07-30 NOTE — ED Notes (Signed)
BEHAVIORAL HEALTH ROUNDING Patient sleeping: No. Patient alert and oriented: yes Behavior appropriate: Yes.  ; If no, describe:  Nutrition and fluids offered: Yes  Toileting and hygiene offered: Yes  Sitter present: no Law enforcement present: Yes, ODS 

## 2014-07-30 NOTE — ED Notes (Signed)
Pt lying in bed with his eyes closed  Even nonlabored respirations observed  Even rise and fall of his chest   Continue to monitor

## 2014-07-30 NOTE — ED Notes (Signed)
MD Scotty CourtStafford made aware of pt's BP at 1930 (111/56), and pt given Metoprolol 25mg  (regular medication) at 2130. No orders to recheck BP or any further action at this time.

## 2014-07-30 NOTE — ED Notes (Addendum)
Pt awake and walking around in the dayroom  Pt is talkative  "I want to know if they cancelled special olympics cause I want to go."  Pt informed that I do not know  Pt continue to talk stating  "sit out here and talk to me  - I just want to talk."   Pt reassured

## 2014-07-30 NOTE — ED Notes (Addendum)
ED BHU PLACEMENT JUSTIFICATION Is the patient under IVC or is there intent for IVC: Yes.   Is the patient medically cleared: Yes.   Is there vacancy in the ED BHU: Yes.   Is the population mix appropriate for patient: Yes.   Is the patient awaiting placement in inpatient or outpatient setting: Yes.   Has the patient had a psychiatric consult: Yes.   Survey of unit performed for contraband, proper placement and condition of furniture, tampering with fixtures in bathroom, shower, and each patient room: Yes.  ; Findings:  APPEARANCE/BEHAVIOR asleep NEURO ASSESSMENT Orientation: pt asleep Hallucinations: No.None noted (Hallucinations) Speech: Rate:asleep Gait: asleep RESPIRATORY ASSESSMENT Pt asleep, unlabored  CARDIOVASCULAR ASSESSMENT regular rate and rhythm, S1, S2 normal, no murmur, click, rub or gallop and pt asleep GASTROINTESTINAL ASSESSMENT pt asleep EXTREMITIES Pt asleep PLAN OF CARE Provide calm/safe environment. Vital signs assessed twice daily. ED BHU Assessment once each 12-hour shift. Collaborate with intake RN daily or as condition indicates. Assure the ED provider has rounded once each shift. Provide and encourage hygiene. Provide redirection as needed. Assess for escalating behavior; address immediately and inform ED provider.  Assess family dynamic and appropriateness for visitation as needed: Yes.  ; If necessary, describe findings:  Educate the patient/family about BHU procedures/visitation: Yes.  ; If necessary, describe findings:

## 2014-07-30 NOTE — ED Provider Notes (Signed)
-----------------------------------------   5:43 PM on 07/30/2014 -----------------------------------------   BP 98/56 mmHg  Pulse 84  Temp(Src) 98.3 F (36.8 C) (Oral)  Resp 18  Ht 5\' 8"  (1.727 m)  Wt 150 lb (68.04 kg)  BMI 22.81 kg/m2  SpO2 97%  The patient had no acute events today.  Calm and cooperative at this time.  Disposition is pending per Psychiatry/Behavioral Medicine team recommendations.    Sherlyn HaySheryl L Larone Kliethermes, DO 07/30/14 1743

## 2014-07-30 NOTE — ED Provider Notes (Signed)
-----------------------------------------   6:57 AM on 07/30/2014 -----------------------------------------   BP 111/56 mmHg  Pulse 75  Temp(Src) 98.1 F (36.7 C) (Oral)  Resp 18  Ht 5\' 8"  (1.727 m)  Wt 150 lb (68.04 kg)  BMI 22.81 kg/m2  SpO2 99%  The patient had no acute events overnight.  Vital signs stable. Calm and cooperative at this time.  Disposition is pending per Psychiatry/Behavioral Medicine team recommendations.     Sharman CheekPhillip Verita Kuroda, MD 07/30/14 [phone]

## 2014-07-30 NOTE — ED Provider Notes (Signed)
-----------------------------------------   8:35 PM on 07/30/2014 -----------------------------------------   BP 113/56 mmHg  Pulse 84  Temp(Src) 98.1 F (36.7 C) (Oral)  Resp 16  Ht 5\' 8"  (1.727 m)  Wt 150 lb (68.04 kg)  BMI 22.81 kg/m2  SpO2 99%  Called by nurse just now patient due his metoprolol which he takes twice a day patient has not had hypertension during his stay in the emergency room I have asked the nurse to hold his metoprolol indefinitely and continue to monitor his vital signs we may need to DC this medicine if his vitals remained as they have been    Arnaldo NatalPaul F Ryann Leavitt, MD 07/30/14 2036

## 2014-07-30 NOTE — ED Notes (Signed)
Pt lying in bed with eyes closed   NAD observed

## 2014-07-30 NOTE — ED Notes (Signed)
Pt is currently in the shower.

## 2014-07-30 NOTE — ED Notes (Addendum)
Pt advised it is 2300 and day room is now closed. Pt calmly and cooperatively went to his room for the night. Pt lying in bed with lights off.
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
## 2014-07-30 NOTE — ED Notes (Signed)
BEHAVIORAL HEALTH ROUNDING Patient sleeping: Yes.   Patient alert and oriented: sleeping Behavior appropriate: Yes.  ; If no, describe:  Nutrition and fluids offered: Yes  Toileting and hygiene offered: Yes  Sitter present: yes  q15 minute checks Law enforcement present: yes  ODS 

## 2014-07-30 NOTE — ED Notes (Signed)
Pt has completed his am hygiene   Am meds administered as ordered   Assessment completed
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
## 2014-07-30 NOTE — ED Provider Notes (Deleted)
-----------------------------------------   5:47 PM on 07/30/2014 -----------------------------------------   BP 98/56 mmHg  Pulse 84  Temp(Src) 98.3 F (36.8 C) (Oral)  Resp 18  Ht 5\' 8"  (1.727 m)  Wt 150 lb (68.04 kg)  BMI 22.81 kg/m2  SpO2 97%  The patient had no acute events .  Calm and cooperative at this time.  Disposition is pending per Psychiatry/Behavioral Medicine team recommendations.    Sherlyn HaySheryl L Zahriyah Joo, DO 07/30/14 1747

## 2014-07-30 NOTE — ED Notes (Addendum)
Pt report received from Amy T RN. Pt care assumed. Pt lying in bed with no needs or concerns at this time. ED tech in to do VS.

## 2014-07-30 NOTE — Consult Note (Signed)
  Psychiatry: Follow-up note for this patient with developmental disability and autistic spectrum disorder. He has no new complaints today. Denies feeling depressed. Denies suicidal or homicidal ideation. He does feel discouraged and anxious today about missing his program with [employer] as described yesterday.  On review of systems he has no physical complaints. [place] review of systems negative. Denies hallucinations. Denies suicidal or homicidal ideation.  On mental status exam he is adequately groomed although he still has some body odor. Basically taking care of his ADLs with assistance in a controlled environment. Eye contact intermittent psychomotor activity a little slow. Speech slow and slurred and often difficult to understand which is baseline for him. Affect dysphoric and often appears confused in trying to get his thoughts together. Not expressing any violence or aggression or threats. No suicidal ideation. Denies hallucinations. He is oriented to the basic fact that he is in the hospital but his mental abilities are so slow that I don't know that he can really comprehend everything that is going on.  No indication for any changed medication. Tolerating medicine well. No seizures no agitation no sign of bipolar activity or psychosis. Case discussed with representative from [employer]. We need to speak with representatives from developmental disabilities. Patient clearly needs to be placed in a specialized environment with staff who have an ability to manage persons with autism and developmental disability and his level of impulsivity. We are working on finding appropriate disposition. Psychoeducation and supportive counseling and encouragement to remain patient completed.  Diagnosis developmental disability not otherwise specified autistic spectrum disorder

## 2014-07-30 NOTE — ED Notes (Signed)
Breakfast provided  Pt lying in bed with his eyes closed  NAD observed

## 2014-07-31 NOTE — ED Notes (Signed)
Pt up to bathroom and back to bed.

## 2014-07-31 NOTE — ED Notes (Signed)
BEHAVIORAL HEALTH ROUNDING Patient sleeping: No. Patient alert and oriented: yes Behavior appropriate: Yes.  ; If no, describe:  Nutrition and fluids offered: Yes  Toileting and hygiene offered: Yes  Sitter present: yes  q15minute checks Law enforcement present: yes  ODS 

## 2014-07-31 NOTE — ED Notes (Signed)
Pt lying in bed  NAD observed

## 2014-07-31 NOTE — Consult Note (Signed)
  Psychiatry: Follow-up note for this young man with autistic spectrum disorder and developmental disability and possible schizoaffective disorder. No new complaints today. Continues to be anxious and mildly agitated but not threatening or hostile.  On review of systems he has no new physical complaints. Continues to be anxious and focused on wanting to get out of here.  On mental status exam he is reasonably well groomed. Eye contact intermittent psychomotor activity fidgety. He paces a lot but it doesn't appear to be akathisia. Affect blunted. No report of suicidal or homicidal thoughts. No report of hallucinations. Judgment and InSight chronically impaired  No change indicated to psychiatric medicine. Supportive counseling completed with patient. We are still working on finding an appropriate discharge plan. I did speak with [employer] representative yesterday but there hasn't been any immediate change to the plan.

## 2014-07-31 NOTE — ED Notes (Signed)
BEHAVIORAL HEALTH ROUNDING Patient sleeping: Yes.   Patient alert and oriented: asleep Behavior appropriate: Yes.  ; If no, describe:  Nutrition and fluids offered: asleep Toileting and hygiene offered: asleep Sitter present: no Law enforcement present: Yes, ODS 

## 2014-07-31 NOTE — ED Notes (Signed)
ED BHU PLACEMENT JUSTIFICATION Is the patient under IVC or is there intent for IVC: Yes.   Is the patient medically cleared: Yes.   Is there vacancy in the ED BHU: Yes.   Is the population mix appropriate for patient: Yes.   Is the patient awaiting placement in inpatient or outpatient setting: Yes.   Has the patient had a psychiatric consult: Yes.   Survey of unit performed for contraband, proper placement and condition of furniture, tampering with fixtures in bathroom, shower, and each patient room: Yes.  ; Findings:  APPEARANCE/BEHAVIOR calm NEURO ASSESSMENT Orientation: time, place and person Hallucinations: No. Speech: Normal Gait: normal RESPIRATORY ASSESSMENT No respiratory distress CARDIOVASCULAR ASSESSMENT regular rate and rhythm, S1, S2 normal, no murmur, click, rub or gallop and regular heart rate GASTROINTESTINAL ASSESSMENT non-tender EXTREMITIES Normal gait, moves all extremities  PLAN OF CARE Provide calm/safe environment. Vital signs assessed twice daily. ED BHU Assessment once each 12-hour shift. Collaborate with intake RN daily or as condition indicates. Assure the ED provider has rounded once each shift. Provide and encourage hygiene. Provide redirection as needed. Assess for escalating behavior; address immediately and inform ED provider.  Assess family dynamic and appropriateness for visitation as needed: Yes.  ; If necessary, describe findings:  Educate the patient/family about BHU procedures/visitation: Yes.  ; If necessary, describe findings: no family present currently

## 2014-07-31 NOTE — ED Notes (Signed)
Patient assigned to appropriate care area. Patient oriented to unit/care area: Informed that, for their safety, care areas are designed for safety and monitored by security cameras at all times; and visiting hours explained to patient. Patient verbalizes understanding, and verbal contract for safety obtained. 

## 2014-07-31 NOTE — ED Notes (Signed)
Pt resting in bed with eyes closed. No unusual behavior observed. Pt has no needs or concerns at this time. Will continue to monitor and f/u as needed.  

## 2014-07-31 NOTE — ED Notes (Signed)
BEHAVIORAL HEALTH ROUNDING Patient sleeping: No. Patient alert and oriented: yes Behavior appropriate: Yes.  ; If no, describe:  Nutrition and fluids offered: Yes  Toileting and hygiene offered: Yes  Sitter present: no Law enforcement present: Yes  

## 2014-07-31 NOTE — ED Notes (Signed)
meds administered as ordered  Pt with no verbalized need at this time

## 2014-07-31 NOTE — ED Notes (Signed)
Pt continues to lie in bed  NAD observed  Continue to monitor

## 2014-07-31 NOTE — ED Notes (Signed)
meds administered as ordered    Pt with no verbalized needs or complaints

## 2014-07-31 NOTE — ED Notes (Signed)
Pt observed lying in bed - touching himself

## 2014-07-31 NOTE — ED Notes (Signed)
BEHAVIORAL HEALTH ROUNDING Patient sleeping: Yes.   Patient alert and oriented: asleep Behavior appropriate: Yes.  ; If no, describe:  Nutrition and fluids offered: asleep Toileting and hygiene offered: asleep Sitter present: no Law enforcement present: Yes. ODS

## 2014-07-31 NOTE — ED Notes (Signed)
He has talked on the phone and is now requesting that we find out if another high school will have special olympics  "I want to go to a group home where they have not had special olympics yet."   Pt told that i have no idea about when special olympics take place but if I find out then I will let him know

## 2014-07-31 NOTE — ED Notes (Signed)
Delay in med administration due to pt sleeping

## 2014-07-31 NOTE — ED Notes (Signed)

## 2014-07-31 NOTE — ED Notes (Signed)
Lunch provided   Delayed from the kitchen

## 2014-07-31 NOTE — ED Notes (Signed)
Pt ambulating in the dayroom while talking on the telephone    NAD observed  No verbalized need or concerns at this time   Continue to monitor

## 2014-07-31 NOTE — ED Provider Notes (Signed)
-----------------------------------------   2:11 AM on 07/31/2014 -----------------------------------------   BP 113/56 mmHg  Pulse 84  Temp(Src) 98.1 F (36.7 C) (Oral)  Resp 16  Ht 5\' 8"  (1.727 m)  Wt 150 lb (68.04 kg)  BMI 22.81 kg/m2  SpO2 99%  The patient had no acute events overnight.  Calm and cooperative at this time.  Disposition is pending per Psychiatry/Behavioral Medicine team recommendations.     Sharyn CreamerMark Deedra Pro, MD 07/31/14 [phone]

## 2014-07-31 NOTE — ED Notes (Signed)
Pt lying in bed with his eyes closed   NAd observed

## 2014-07-31 NOTE — ED Notes (Signed)
Supper provided  Pt lying in bed  NAD observed  Continue to monitor

## 2014-07-31 NOTE — ED Notes (Signed)
Pt awake -  He is taking a shower

## 2014-07-31 NOTE — ED Notes (Signed)
Pt to the door requesting the remote control   No unusual behavior observed

## 2014-07-31 NOTE — ED Notes (Signed)
Assessment completed   Shower completed  meds to be administered when food arrives

## 2014-07-31 NOTE — ED Notes (Signed)
Pt observed lying in bed with his eyes closed  Appears to be sleeping  Even nonlabored respirations observed  NAD  Continue to monitor

## 2014-07-31 NOTE — ED Notes (Signed)
ED BHU PLACEMENT JUSTIFICATION Is the patient under IVC or is there intent for IVC: Yes.   Is the patient medically cleared: Yes.   Is there vacancy in the ED BHU: Yes.   Is the population mix appropriate for patient: Yes.   Is the patient awaiting placement in inpatient or outpatient setting: Yes.   Has the patient had a psychiatric consult: Yes.   Survey of unit performed for contraband, proper placement and condition of furniture, tampering with fixtures in bathroom, shower, and each patient room: Yes.  ; Findings:  APPEARANCE/BEHAVIOR:   COOPERATIVE  CALM NEURO ASSESSMENT Orientation"  Oriented x3   Hallucinations: No.None noted (Hallucinations) Speech: Normal Gait: normal RESPIRATORY ASSESSMENT Even  Unlabored respirations CARDIOVASCULAR ASSESSMENT regular rate and rhythm, S1, S2 normal, no murmur, click, rub or gallop and pulses equal  regular rate  skin warm and dry  GASTROINTESTINAL ASSESSMENT no Gi complaint EXTREMITIES Full ROM PLAN OF CARE Provide calm/safe environment. Vital signs assessed twice daily. ED BHU Assessment once each 12-hour shift. Collaborate with intake RN daily or as condition indicates. Assure the ED provider has rounded once each shift. Provide and encourage hygiene. Provide redirection as needed. Assess for escalating behavior; address immediately and inform ED provider.  Assess family dynamic and appropriateness for visitation as needed: Yes.  ; If necessary, describe findings:  Educate the patient/family about BHU procedures/visitation: Yes.  ; If necessary, describe findings:

## 2014-07-31 NOTE — ED Notes (Signed)
BEHAVIORAL HEALTH ROUNDING Patient sleeping: Yes.   Patient alert and oriented: sleeping Behavior appropriate: Yes.  ; If no, describe:  Nutrition and fluids offered: Yes  Toileting and hygiene offered: Yes  Sitter present: yes  q15 minute checks Law enforcement present: yes  ODS 

## 2014-07-31 NOTE — ED Notes (Signed)
Pt has laid back down after his shower - i went in to administer meds and his eyes were closed   Continue to monitor

## 2014-07-31 NOTE — ED Provider Notes (Signed)
-----------------------------------------   1:22 PM on 07/31/2014 -----------------------------------------   BP 97/55 mmHg  Pulse 68  Temp(Src) 97.6 F (36.4 C) (Oral)  Resp 16  Ht 5\' 8"  (1.727 m)  Wt 150 lb (68.04 kg)  BMI 22.81 kg/m2  SpO2 98%  The patient had no acute events since last update.  Calm and cooperative at this time.  Disposition is pending per Psychiatry/Behavioral Medicine team recommendations.     Loleta Roseory Lamyah Creed, MD 07/31/14 1322

## 2014-07-31 NOTE — ED Notes (Signed)
Pt lying in bed watching TV  NAD observed  

## 2014-08-01 NOTE — ED Notes (Signed)
BEHAVIORAL HEALTH ROUNDING Patient sleeping: Yes.   Patient alert and oriented: not applicable Behavior appropriate: Yes.  ; If no, describe: sleeping Nutrition and fluids offered: No Toileting and hygiene offered: No Sitter present: not applicable Law enforcement present: Yes ODS 

## 2014-08-01 NOTE — ED Provider Notes (Signed)
-----------------------------------------   12:48 AM on 08/01/2014 -----------------------------------------   BP 119/67 mmHg  Pulse 78  Temp(Src) 98.3 F (36.8 C) (Oral)  Resp 20  Ht 5\' 8"  (1.727 m)  Wt 150 lb (68.04 kg)  BMI 22.81 kg/m2  SpO2 98%  The patient had no acute events since last update.  Calm and cooperative at this time.  Disposition is pending per Psychiatry/Behavioral Medicine team recommendations.     Sharyn CreamerMark Janicia Monterrosa, MD 08/01/14 [phone]

## 2014-08-01 NOTE — ED Notes (Signed)
BEHAVIORAL HEALTH ROUNDING Patient sleeping: Yes.   Patient alert and oriented: not applicable Behavior appropriate: Yes.  ; If no, describe: SLEEPING Nutrition and fluids offered: No Toileting and hygiene offered: No Sitter present: not applicable Law enforcement present: Yes ODS 

## 2014-08-01 NOTE — ED Notes (Signed)
BEHAVIORAL HEALTH ROUNDING Patient sleeping: No. Patient alert and oriented: yes Behavior appropriate: Yes.  ; If no, describe:  Nutrition and fluids offered: Yes  Toileting and hygiene offered: Yes  Sitter present: not applicable Law enforcement present: Yes  

## 2014-08-01 NOTE — ED Provider Notes (Signed)
-----------------------------------------   11:24 PM on 08/01/2014 -----------------------------------------   BP 105/76 mmHg  Pulse 65  Temp(Src) 98.1 F (36.7 C) (Oral)  Resp 16  Ht 5\' 8"  (1.727 m)  Wt 150 lb (68.04 kg)  BMI 22.81 kg/m2  SpO2 96%  The patient had no acute events since last update.  Calm and cooperative at this time.  Disposition is pending per Psychiatry/Behavioral Medicine team recommendations. No new recommendations at this time.     Loleta Roseory Gerard Bonus, MD 08/01/14 [phone]

## 2014-08-01 NOTE — ED Notes (Signed)
Pharmacy called to request Tegretol XR be routinely sent for pt, since XR is not in pixis.

## 2014-08-01 NOTE — ED Notes (Signed)
BEHAVIORAL HEALTH ROUNDING Patient sleeping: Yes.   Patient alert and oriented: not applicable Behavior appropriate: Yes.  ; If no, describe:  Nutrition and fluids offered: No Toileting and hygiene offered: No Sitter present: not applicable Law enforcement present: Yes  

## 2014-08-01 NOTE — ED Notes (Signed)
240ml

## 2014-08-01 NOTE — ED Notes (Signed)

## 2014-08-01 NOTE — ED Notes (Signed)
BEHAVIORAL HEALTH ROUNDING Patient sleeping: No. Patient alert and oriented: yes Behavior appropriate: Yes.  ; If no, describe:  Nutrition and fluids offered: Yes  Toileting and hygiene offered: Yes  Sitter present: not applicable Law enforcement present: Yes ODS/shift 

## 2014-08-01 NOTE — ED Provider Notes (Signed)
-----------------------------------------   2:31 PM on 08/01/2014 -----------------------------------------   BP 121/59 mmHg  Pulse 72  Temp(Src) 97.7 F (36.5 C) (Oral)  Resp 20  Ht 5\' 8"  (1.727 m)  Wt 150 lb (68.04 kg)  BMI 22.81 kg/m2  SpO2 98%  The patient had no acute events since last update.  Calm and cooperative at this time.  Disposition is pending per Psychiatry/Behavioral Medicine team recommendations.    Arnaldo NatalPaul F Randall Rampersad, MD 08/01/14 [phone]

## 2014-08-01 NOTE — ED Notes (Signed)
Dinner 75%.  240 ml. 

## 2014-08-01 NOTE — ED Notes (Signed)
BEHAVIORAL HEALTH ROUNDING  Patient sleeping: No.  Patient alert and oriented: yes  Behavior appropriate: Yes. ; If no, describe:  Nutrition and fluids offered: Yes  Toileting and hygiene offered: Yes  Sitter present: not applicable  Law enforcement present: Yes ODS  

## 2014-08-01 NOTE — ED Notes (Addendum)
BEHAVIORAL HEALTH ROUNDING Patient sleeping: Yes.   Patient alert and oriented: not applicable Behavior appropriate: Yes.  ; If no, describe: sleeping Nutrition and fluids offered: No Toileting and hygiene offered: No Sitter present: not applicable Law enforcement present: Yes ods 

## 2014-08-02 MED ORDER — SERTRALINE HCL 100 MG PO TABS
ORAL_TABLET | ORAL | Status: AC
  Administered 2014-08-02: 100 mg via ORAL
  Filled 2014-08-02: qty 1

## 2014-08-02 NOTE — ED Notes (Signed)
BEHAVIORAL HEALTH ROUNDING Patient sleeping: Yes.   Patient alert and oriented: not applicable Behavior appropriate: Yes.  ; If no, describe: SLEEPING Nutrition and fluids offered: No Toileting and hygiene offered: No Sitter present: not applicable Law enforcement present: Yes ODS 

## 2014-08-02 NOTE — ED Notes (Signed)
BEHAVIORAL HEALTH ROUNDING Patient sleeping: No. Patient alert and oriented: yes Behavior appropriate: Yes.  ; If no, describe:  Nutrition and fluids offered: Yes  Toileting and hygiene offered: Yes  Sitter present: not applicable Law enforcement present: Yes  

## 2014-08-02 NOTE — ED Notes (Signed)
Snack and drink provided at bedside.  Silverware removed from tray 

## 2014-08-02 NOTE — ED Notes (Signed)
Pt. Up and using the bathroom. 

## 2014-08-02 NOTE — ED Notes (Signed)
Pt. Went in to use bathroom.

## 2014-08-02 NOTE — ED Notes (Signed)
BEHAVIORAL HEALTH ROUNDING Patient sleeping: Yes.   Patient alert and oriented: not applicable Behavior appropriate: Yes.  ; If no, describe:  Nutrition and fluids offered: No Toileting and hygiene offered: No Sitter present: not applicable Law enforcement present: Yes  
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
## 2014-08-02 NOTE — ED Provider Notes (Signed)
-----------------------------------------   7:15 AM on 08/02/2014 -----------------------------------------   BP 105/76 mmHg  Pulse 65  Temp(Src) 98.1 F (36.7 C) (Oral)  Resp 16  Ht 5\' 8"  (1.727 m)  Wt 150 lb (68.04 kg)  BMI 22.81 kg/m2  SpO2 96%  The patient had no acute events since last update.  Calm and cooperative at this time.  Disposition is pending per Psychiatry/Behavioral Medicine team recommendations.     Sharyn CreamerMark Quale, MD 08/02/14 [phone]

## 2014-08-02 NOTE — ED Notes (Signed)
Pt. Asking about friends phone #'s that he left here the last time he was here.  Pt. States is was written on a piece of paper in his room.  I told pt. If he left paper in room it was probably thrown away, but  Told pt. He could call friend or family in morning to find full name of friends or phone #'s.  (Abby,Ashley, Aqliega)

## 2014-08-02 NOTE — ED Notes (Signed)
ED BHU PLACEMENT JUSTIFICATION Is the patient under IVC or is there intent for IVC: Yes.   Is the patient medically cleared: Yes.   Is there vacancy in the ED BHU: Yes.   Is the population mix appropriate for patient: Yes.   Is the patient awaiting placement in inpatient or outpatient setting: Yes.   Has the patient had a psychiatric consult: Yes.   Survey of unit performed for contraband, proper placement and condition of furniture, tampering with fixtures in bathroom, shower, and each patient room: Yes.  ; Findings:  APPEARANCE/BEHAVIOR Calm, cooperative NEURO ASSESSMENT Orientation: person, place Hallucinations: No.None noted (Hallucinations) Speech: Normal Gait: normal RESPIRATORY ASSESSMENT Normal expansion.  Clear to auscultation.  No rales, rhonchi, or wheezing. CARDIOVASCULAR ASSESSMENT regular rate and rhythm, S1, S2 normal, no murmur, click, rub or gallop GASTROINTESTINAL ASSESSMENT soft, nontender, BS WNL, no r/g EXTREMITIES normal strength, tone, and muscle mass PLAN OF CARE Provide calm/safe environment. Vital signs assessed twice daily. ED BHU Assessment once each 12-hour shift. Collaborate with intake RN daily or as condition indicates. Assure the ED provider has rounded once each shift. Provide and encourage hygiene. Provide redirection as needed. Assess for escalating behavior; address immediately and inform ED provider.  Assess family dynamic and appropriateness for visitation as needed: Yes.  ; If necessary, describe findings:  Educate the patient/family about BHU procedures/visitation: Yes.  ; If necessary, describe findings:

## 2014-08-02 NOTE — ED Notes (Signed)

## 2014-08-02 NOTE — ED Provider Notes (Signed)
-----------------------------------------   11:07 PM on 08/02/2014 -----------------------------------------   BP 98/48 mmHg  Pulse 76  Temp(Src) 97.4 F (36.3 C) (Oral)  Resp 15  Ht 5\' 8"  (1.727 m)  Wt 150 lb (68.04 kg)  BMI 22.81 kg/m2  SpO2 100%  The patient had no acute events since last update.  Calm and cooperative at this time.  Disposition is pending per Psychiatry/Behavioral Medicine team recommendations.  Sammuel Hineseborah Moore with clinical social work continues to look for placement in a group home.     Loleta Roseory Braylyn Eye, MD 08/02/14 [id number]

## 2014-08-02 NOTE — ED Notes (Signed)
BEHAVIORAL HEALTH ROUNDING Patient sleeping: Yes.   Patient alert and oriented: not applicable Behavior appropriate: Yes.  ; If no, describe:  Nutrition and fluids offered: Yes  Toileting and hygiene offered: No Sitter present: not applicable Law enforcement present: Yes ODS/shift

## 2014-08-02 NOTE — Progress Notes (Signed)
[facility] [phone].  Stated patient is not registered with [facility] but she is willing to take information to place him on the wait list for crisis prevention and supportive services.  [facility] will call patient's mother and care coordinator once they are able to provide the services.  Unable to offer any other assistance at this time. CSW provided [facility](Tammy Rivers [phone]  and patient's guardian Donna ChristenSandra Eickhoff [phone].   CSW will continue to follow patient and work with Care Coordinator for disposition/placement.     Sammuel Hineseborah Vasilisa Vore. Theresia MajorsLCSWA, MSW Clinical Social Work Department Emergency Room [phone] 2:14 PM

## 2014-08-02 NOTE — ED Notes (Signed)
BEHAVIORAL HEALTH ROUNDING Patient sleeping: Yes.   Patient alert and oriented: alert Behavior appropriate: Yes.  ; If no, describe:  Nutrition and fluids offered: No Toileting and hygiene offered: No Sitter present: not applicable Law enforcement present: Yes

## 2014-08-02 NOTE — Progress Notes (Addendum)
Group homes and family care facilities contacted.   Patient denied or no beds available.     Facility/Residence  [facility] [place]  Location: [place], [place] Phone: [phone]   Contact Person: Cathlean Sauer [facility]  Location: [place], [place] Phone: [phone]   Contact Person: Tamala Bari   [facility]  Location: [place], [place] Phone: [phone]   Contact Person: Junius Creamer   [facility]  Location: [place], [place] Phone: [phone]   Contact Person: Junius Creamer   [facility]  Location: [place], [place] Phone: [phone]  Contact Person: Glenda [facility]  Location: [place], [place] Phone: [phone]  Contact Person: not listed   [facility]  Location: [place], [place] Phone: [phone]  Contact Person: not listed   [facility] [address]  Location: [place], [place] Phone: [phone]  Contact Person: Dimas Aguas    [facility] [place]  Location: [place], [place] Phone: [phone]  Contact Person: Terrie Siy-Hian    [address] [employer]  Location: [place], [place] Phone: [phone]  Contact Person: [facility]  Location: [place], [place] Phone: [phone]  Contact Person: not listed   [facility]  Location: [place], [place] Phone: [phone]  Contact Person: [facility]  Location: [place], [place] Phone: [phone]  Contact Person: Isaias Cowman [facility]  Location: [place], [place] Phone: [phone]  Contact Person: Larkin [facility]a [facility]  Location: [place], [place] Phone: [phone]  Contact Person: Candee Furbish [facility]  Location: [place], [place] Phone: [phone]  Contact Person: Candee Furbish [facility]   Location: [place], [place] Phone: [phone]  Contact Person: Oneal Grout    [facility]  Location: [place], [place] Phone: [phone]  Contact Person: not listed   [facility]  Location: [place], [place] Phone: [phone]  Contact Person: not listed   [facility]  Location: [place], [place] Phone: [phone]  Contact Person: not listed   [facility]  Location: [place], [place] Phone: [phone]  Contact Person: [facility]  Location: [place], [place] Phone: [phone]  Contact Person: Dimas Aguas    [facility]  Location: [place], [place] Phone: [phone]  Contact Person: Donivan Scull    [facility]  Location: [place], [place] Phone: [phone]  Contact Person: not listed   [address]  Location: [place], [place] Phone: [phone]  Contact Person: not listed   [facility] [place]  Location: [place], [place] Phone: [phone]  Contact Person: [facility]  Location: [place], [place] Phone: [phone]  Contact Person: Leslee Home    [facility] Acqu[facility]etta [facility] [place]  Location: [place], [place] Phone: [phone]  Contact Person: Derrill Center    [facility]  Location: [place], [place] Phone: [phone]  Contact Person: Lynn Ito    [facility]  Location: [place], [place] Phone: [phone]  Contact Person: Charleston Poot    [facility]  Location: [place], [place] Phone: [phone]  Contact Person: not  listed   [address] [facility] [place]  Location: [place], [place] Phone: [phone]  Contact Person: Veneda Melterressa Hogan    [facility] [address] [place]  Location: [place], [place] Phone: [phone]  Contact Person: Micah Fleshereresa Seate    [facility] [place]  Location: [place], [place] Phone: [phone]  Contact Person: Lenn CalJeanne Eisenhaure    [facility]  [facility]  Location: [place], [place] Phone: [phone]  Contact Person: Amada Jupiterracy Degree    [facility] [place]  Location: [place], [place] Phone: [phone]  Contact Person: Reina FuseSherry Daniels    [facility] [place]  Location: [place], [place] Phone: [phone]  Contact Person: Lily KocherMichelle Atkins    [facility] [place]  Location: [place], [place] Phone: [phone]  Contact Person: Laurel DimmerJill Snavley    [facility] [place]  Location: [place], [place] Phone: [phone]  Contact Person: Jola Schmidtracy Holcomb    [facility]  Location: [place], [place] Phone: [phone]  Contact Person: Ranee GosselinLynn Michael    [facility] [place]  Location: [place], [place] Phone: [phone]  Contact Person: Coletta MemosJodi Matheny    [facility]  Location: [place], [place] Phone: [phone]  Contact Person: Izell Carolinarin Domian    [facility]  Location: [place], [place] Phone: [phone]  Contact Person: [facility] Surgeon    [facility]  Location: [place], [place] Phone: [phone]  Contact Person: [place] CapriGrey Burleson    [facility]  Location: [place], [place] Phone: [phone]  Contact Person: [facility]acy Ward    [facility]  Location: [place], [place] Phone: [phone]  Contact Person: Mylo RedStephan Robertson    [facility]  Location: [place], [place] Phone: [phone]  Contact Person: Hedda SladeLeslie [place]    [facility] [place]  Location: [place], [place] Phone: [phone]  Contact Person: Arville GoWendy Edwards    [facility]  Location: [place], [place] Phone: [phone]  Contact Person: Melida QuitterJames Blount    [address]  Location: [place], [place] Phone: [phone]  Contact Person: Barbie HaggisKaye Mullis    [address]  Location: [place], [place] Phone: [phone]  Contact Person: Dani GobbleKathy Edens [facility]    [facility]  Location: [place], [place] Phone: [phone]  Contact Person: not  listed   [facility]  Location: [place], [place] Phone: [phone]  Contact Person: Veryl Speakiffany Richmond    [facility]  Location: [place], [place] Phone: [phone]  Contact Person: Philis KendallVicky Connally    [facility] [place] [facility]  Location: [place], [place] Phone: [phone]  Contact Person: Brantley StageVicky    [facility]  Location: [place], [place] Phone: [phone]  Contact Person: Madelon LipsLinda Morrow   [facility]   Location: [place], [place] Phone: [phone]  Contact Person: Brooke Pacelarel Yancey   [facility]  Location: [place], [place] Phone: [phone]  Contact Person: not listed   [facility]  Location: [place], [place] Phone: [phone]  Contact Person: Gatha Mayerebra Craft    [facility]  Location: [place], [place] Phone: [phone]  Contact Person: not listed   [facility]  Location: [place], [place] Phone: [phone]  Contact Person: Lonell GrandchildBecky    [place] [facility]  Location: [place], [place] Phone: [phone]  Contact Person: not listed   [facility]  Location: [place], [place] Phone: [phone]  Contact Person: Marita KansasVernon  [facility]  Location: [place], [place] Phone: [phone]  Contact Person: Avis Epley    [facility]  Location: [place], [place] Phone: [phone]  Contact Person: Threasa [facility]  Location: [place], [place] Phone: [phone]  Contact Person: Toma Deiters    [facility]  Location: [place], [place] Phone: [phone]  Contact Person: Charleen Kirks  Location: [place], [place] Phone: [phone]  Contact Person: Pablo Lawrence    [address] [address]  Location: [place], [place] Phone: [phone]  Contact Person: not listed   [place] [facility] [place]  Location: [place], [place] Phone: [phone]  Contact Person: not  listed   [place] [facility] [place]  Location: [place] Phone: [phone]  Contact Person: Otilio Miu [facility]  Location: [place], [place] Phone: [phone]  Contact Person: Lourdes Sledge    [facility]  Location: [place], [place] Phone: [phone]  Contact Person: Nolberto Hanlon    [address]  Location: [place], [place] Phone: [phone]  Contact Person: Darrall Dears    [facility]  Location: [place], [place] Phone: [phone]  Contact Person: not listed   [facility]  Location: [place], [place] Phone: [phone]  Contact Person: Glory Rosebush    [facility]  Location: [place], [place] Phone: [phone]  Contact Person: [facility]  Location: [place], [place] Phone: [phone]  Contact Person: Malon Kindle    [facility] [address]  Location: [place], [place] Phone: [phone]  Contact Person: Christean Leaf    [facility]  Location: [place], [place] Phone: [phone]  Contact Person: [facility]  Location: [place], [place] Phone: [phone]  Contact Person: not listed   [facility]  Location: [place], [place] Phone: [phone]  Contact Person: not listed   [facility]  Location: [place], [place] Phone: [phone]  Contact Person: not listed   [facility]  Location: [place], [place] Phone: [phone]  Contact Person: Wilber Oliphant    [facility]  Location: [place] , [place] Phone: [phone]  Contact Person: Baldemar [facility]is [facility]  Location: [place], [place] Phone: [phone]  Contact Person: not listed   [facility]  Location: [place], [place] Phone: [phone]  Contact Person: not listed   [facility]  Location: [place], [place] Phone: [phone]  Contact Person: not listed   [facility]  Location: [place], [place] Phone: [phone]  Contact Person: Larey Brick [facility]  Location: [place], [place] Phone: [phone]  Contact Person: not listed   [facility]  Location: [place], [place] Phone: [phone]  Contact Person: Darrall Dears    [facility]  Location: [place], [place] Phone: [phone]  Contact Person: Zollie Beckers    [facility]  Location: [place], [place] Phone: [phone]  Contact Person: [facility]ella [facility]  Location: [place], [place] Phone: [phone]  Contact Person: Raeanne Barry [facility] 3  Location: [place], [place] Phone: [phone]  Contact Person: Boris Sharper  [facility] [place]  Location: [place], [place] Phone: [phone]  Contact Person: Scharlene GlossGlenda Liggins    [facility]  Location: [place], [place] Phone: [phone]  Contact Person: Francesca JewettGlenda Liggins    [facility]  Location: [place], [place] Phone: [phone]  Contact Person: Chauncey Readingherry Crisp    [facility]  Location: [place], [place] Phone: [phone]  Contact Person: not listed   [facility]  Location: [place], [place] Phone: [phone]  Contact Person: Idella Limmie PatriciaKellam    [facility]  Location: [place], [place] Phone: [phone]  Contact Person: not listed   [facility]  Location: [place], [place] Phone: [phone]  Contact Person: not listed   [facility]  Location: [place], [place] Phone: [phone]  Contact Person: not listed   [facility]  Location: [place], [place] Phone: [phone]  Contact Person: Sula SodaAlfreda Graves    [facility]  Location: [place], [place] Phone: [phone]  Contact Person: Stephanie Couphomas Woodson    [facility]  Location: [place], [place] Phone: [phone]  Contact Person: Clement SayresErika Davis    [facility]  Location: [place], [place] Phone: [phone]  Contact Person: Cameron SprangAlfreda Graves    [facility]  Location: [place], [place] Phone: [phone]  Contact Person: Margareta [facility] Center[facility]    [facility]  Location: [place], [place] Phone: [phone]  Contact Person: not listed   [facility]  Location: [place], [place] Phone: [phone]  Contact Person: Francee Piccoloanita Wilson    [facility]  Location: [place], [place] Phone: [phone]  Contact Person: not listed   [facility]  Location: [place], [place] Phone: [phone]  Contact Person: not listed   [facility]  Location: [place], [place] Phone: [phone]  Contact Person: not listed   Johnnye Sima[place]  LocationDan Humphreys: [place], [place] Phone: [phone]  Contact Person: Knox RoyaltyHilda W. Brown    [place]  Location: [place], [place] Phone: [phone]  Contact Person: Buffy Bingham    [facility]  Location: [place], [place] Phone: [phone]  Contact Person: not listed   [facility] [place]  Location: [place], [place] Phone: [phone]  Contact Person: not listed   [facility]  Location: [place], [place] Phone: [phone]  Contact Person: not listed   [facility] [place]  Location: [place], [place] Phone: [phone]  Contact Person: not listed   [facility]  Location: [place], [place] Phone: [phone]  Contact Person: Leda GauzeLonnie Graves    [facility] [place]  Location: [place], [place] Phone: [phone]  Contact Person: Intake    [facility]  Location: [place], [place] Phone: [phone]  Contact Person: Junie SpencerBarbara Taylor    [facility]  Location: [place], [place] Phone: [phone]    [facility]  Location: [place], [place] Phone: [phone]    [facility]  Location: [place], [place] Phone: [phone]    [facility]   Location: [facility], [place] Phone: [phone]  Contact Person: not listed   [employer] [facility] [place]  Location: [place], [place] Phone: [phone]  Contact Person: Debby BudFrandee Nichols    [facility]  Location: [place], [place] Phone: [phone]    [facility]  Location: [place] Phone: [phone]  Contact Person: Leda Min    [facility]  Location: [place], [place] Phone: [phone]  Contact Person: Isaias Sakai    [facility]  Location: [place], [place] Phone: [phone]  Contact Person: Isaias Sakai    [facility]  Location: [place], [place] Phone: [phone]  Contact Person: Flint Melter    [facility]  Location: [place], [place] Phone: [phone]  Contact Person: not listed   [facility] 3  Location: [place], [place] Phone: [phone]  Contact Person: Rica Records    [facility]  Location: [place], [place] Phone: [phone]    Sammuel Hines. Theresia Majors, MSW Clinical Social Work Department Emergency Room [phone] 3:45 PM

## 2014-08-02 NOTE — ED Notes (Signed)
ED BHU PLACEMENT JUSTIFICATION Is the patient under IVC or is there intent for IVC: Yes.   Is the patient medically cleared: Yes.   Is there vacancy in the ED BHU: Yes.   Is the population mix appropriate for patient: Yes.   Is the patient awaiting placement in inpatient or outpatient setting: Yes.   Has the patient had a psychiatric consult: Yes.   Survey of unit performed for contraband, proper placement and condition of furniture, tampering with fixtures in bathroom, shower, and each patient room: Yes.  ; Findings: ALL CLEAR APPEARANCE/BEHAVIOR calm, cooperative and adequate rapport can be established NEURO ASSESSMENT Orientation: time, place and person Hallucinations: No.None noted (Hallucinations) Speech: Normal Gait: normal RESPIRATORY ASSESSMENT RESP EVEN AND UNLABORED, NO DISTRESS NOTED.  CARDIOVASCULAR ASSESSMENT SKIN COLOR NORMAL FOR AGE AND RACE.  GASTROINTESTINAL ASSESSMENT soft, nontender, BS WNL, no r/g EXTREMITIES ROM of all joints is normal PLAN OF CARE Provide calm/safe environment. Vital signs assessed twice daily. ED BHU Assessment once each 12-hour shift. Collaborate with intake RN daily or as condition indicates. Assure the ED provider has rounded once each shift. Provide and encourage hygiene. Provide redirection as needed. Assess for escalating behavior; address immediately and inform ED provider.  Assess family dynamic and appropriateness for visitation as needed: Yes.  ; If necessary, describe findings:  Educate the patient/family about BHU procedures/visitation: Yes.  ; If necessary, describe findings: PT UNDERSTANDING AND ACCEPTING OF UNIT RULES AND PROCEDURES. CALM AND COOPERATIVE AT THIS TIME. WILL CONTINUE TO MONITOR.

## 2014-08-02 NOTE — ED Notes (Addendum)
BEHAVIORAL HEALTH ROUNDING Patient sleeping: No. Patient alert and oriented: yes Behavior appropriate: Yes.  ; If no, describe:  Nutrition and fluids offered: Yes  Toileting and hygiene offered: Yes  Sitter present: not applicable Law enforcement present: Yes  

## 2014-08-03 MED ORDER — METOPROLOL TARTRATE 25 MG PO TABS
ORAL_TABLET | ORAL | Status: AC
  Administered 2014-08-03: 22:00:00
  Filled 2014-08-03: qty 1

## 2014-08-03 MED ORDER — METOPROLOL TARTRATE 25 MG PO TABS
ORAL_TABLET | ORAL | Status: AC
  Filled 2014-08-03: qty 1

## 2014-08-03 MED ORDER — LEVETIRACETAM 500 MG PO TABS
ORAL_TABLET | ORAL | Status: AC
  Administered 2014-08-03: 750 mg via ORAL
  Filled 2014-08-03: qty 2

## 2014-08-03 MED ORDER — LEVETIRACETAM 500 MG PO TABS
ORAL_TABLET | ORAL | Status: AC
  Administered 2014-08-03: 22:00:00
  Filled 2014-08-03: qty 2

## 2014-08-03 MED ORDER — METOPROLOL TARTRATE 25 MG PO TABS
ORAL_TABLET | ORAL | Status: AC
  Administered 2014-08-03: 25 mg via ORAL
  Filled 2014-08-03: qty 1

## 2014-08-03 MED ORDER — SERTRALINE HCL 100 MG PO TABS
ORAL_TABLET | ORAL | Status: AC
  Administered 2014-08-03: 100 mg via ORAL
  Filled 2014-08-03: qty 1

## 2014-08-03 MED ORDER — CARBAMAZEPINE 200 MG PO TABS
ORAL_TABLET | ORAL | Status: AC
  Filled 2014-08-03: qty 4

## 2014-08-03 MED ORDER — SERTRALINE HCL 100 MG PO TABS
ORAL_TABLET | ORAL | Status: AC
  Administered 2014-08-03: 22:00:00
  Filled 2014-08-03: qty 1

## 2014-08-03 NOTE — ED Notes (Signed)
Pt. Given breakfast tray at this time. Vitals taken. Pt calm and cooperative at this time.  

## 2014-08-03 NOTE — ED Notes (Signed)
BEHAVIORAL HEALTH ROUNDING Patient sleeping: No. Patient alert and oriented: yes Behavior appropriate: Yes.   Nutrition and fluids offered: Yes  Toileting and hygiene offered: Yes  Sitter present: 15 min checks  Law enforcement present: Yes  

## 2014-08-03 NOTE — ED Notes (Signed)
Pt. Noted in day room. No complaints or concerns voiced. No distress or abnormal behavior noted. Will continue to monitor with security cameras. Q 15 minute rounds continue. 

## 2014-08-03 NOTE — ED Notes (Signed)
Pt. Up using bathroom. 

## 2014-08-03 NOTE — ED Notes (Signed)
BEHAVIORAL HEALTH ROUNDING Patient sleeping: Yes.   Patient alert and oriented: not applicable Behavior appropriate: Yes.  ; If no, describe:  Nutrition and fluids offered: No Toileting and hygiene offered: No Sitter present: not applicable Law enforcement present: Yes  

## 2014-08-03 NOTE — ED Notes (Signed)
Pt used phone to contact family at this time.  

## 2014-08-03 NOTE — ED Notes (Signed)
Lunch given to pt

## 2014-08-03 NOTE — ED Notes (Signed)

## 2014-08-03 NOTE — ED Notes (Signed)
gl

## 2014-08-03 NOTE — ED Notes (Signed)
Supper trays passed out to pt. Pt in room at this time eating.   

## 2014-08-03 NOTE — Progress Notes (Signed)
CSW completed an over the phone referral for ACTT services with [facility] [phone].  The agency will follow with the patient for screening.   Maryelizabeth Rowanressa Aicia Babinski, MSW, LCSW, LCAS Clinical Social Worker [phone]

## 2014-08-03 NOTE — ED Notes (Signed)
Report received from Jean RN. Pt. Alert and oriented in no distress denies SI, HI, AVH and pain.  Pt. Instructed to come to me with problems or concerns.Will continue to monitor for safety via security cameras and Q 15 minute checks. 

## 2014-08-03 NOTE — ED Provider Notes (Signed)
-----------------------------------------   11:10 PM on 08/03/2014 -----------------------------------------   BP 98/48 mmHg  Pulse 66  Temp(Src) 98.2 F (36.8 C) (Oral)  Resp 20  Ht 5\' 8"  (1.727 m)  Wt 150 lb (68.04 kg)  BMI 22.81 kg/m2  SpO2 98%  The patient had no acute events since last update.  Calm and cooperative at this time.  Disposition is pending per Psychiatry/Behavioral Medicine team recommendations.     Rebecka ApleyAllison P Webster, MD 08/03/14 [phone]

## 2014-08-03 NOTE — ED Notes (Signed)
BEHAVIORAL HEALTH ROUNDING Patient sleeping: Yes.   Patient alert and oriented: Pt sleeping  Behavior appropriate: Yes.   Nutrition and fluids offered: Breakfast placed at bedside  Toileting and hygiene offered: Pt sleeping  Sitter present: yes Law enforcement present: Yes

## 2014-08-03 NOTE — ED Provider Notes (Signed)
-----------------------------------------   7:48 AM on 08/03/2014 -----------------------------------------   BP 98/48 mmHg  Pulse 76  Temp(Src) 97.4 F (36.3 C) (Oral)  Resp 15  Ht 5\' 8"  (1.727 m)  Wt 150 lb (68.04 kg)  BMI 22.81 kg/m2  SpO2 100%  The patient had no acute events since last update.  Calm and cooperative at this time.  Disposition is pending per Psychiatry/Behavioral Medicine team recommendations.     Arelia Longestavid M Schaevitz, MD 08/03/14 [phone]

## 2014-08-03 NOTE — ED Notes (Signed)

## 2014-08-03 NOTE — ED Notes (Signed)
IVC  PAPERWORK  ON  CHART (2ND SET) REFERRAL  FOR  ACTT SERVICES PER  TRESSA  CORBETT

## 2014-08-03 NOTE — ED Notes (Signed)
ED BHU PLACEMENT JUSTIFICATION Is the patient under IVC or is there intent for IVC: Yes.   Is the patient medically cleared: Yes.   Is there vacancy in the ED BHU: Yes.   Is the population mix appropriate for patient: Yes.   Is the patient awaiting placement in inpatient or outpatient setting: Yes.   Has the patient had a psychiatric consult: Yes.   Survey of unit performed for contraband, proper placement and condition of furniture, tampering with fixtures in bathroom, shower, and each patient room: Yes.   APPEARANCE/BEHAVIOR calm and cooperative NEURO ASSESSMENT Orientation: time, place and person Hallucinations: No; None noted (Hallucinations) Speech: Normal Gait: normal RESPIRATORY ASSESSMENT Normal expansion.  Clear to auscultation.  No rales, rhonchi, or wheezing. CARDIOVASCULAR ASSESSMENT regular rate and rhythm, S1, S2 normal, no murmur, click, rub or gallop and no acute distress noted. no complaints from pt.  GASTROINTESTINAL ASSESSMENT soft, nontender, BS WNL, no r/g EXTREMITIES normal strength, tone, and muscle mass, gait was normal for age PLAN OF CARE Provide calm/safe environment. Vital signs assessed twice daily. ED BHU Assessment once each 12-hour shift. Collaborate with intake RN daily or as condition indicates. Assure the ED provider has rounded once each shift. Provide and encourage hygiene. Provide redirection as needed. Assess for escalating behavior; address immediately and inform ED provider.  Assess family dynamic and appropriateness for visitation as needed: Yes.    Educate the patient/family about BHU procedures/visitation: Yes.

## 2014-08-03 NOTE — ED Notes (Signed)
Pt. Up using the bathroom.

## 2014-08-03 NOTE — ED Notes (Signed)
Pt. Noted in room. No complaints or concerns voiced. No distress or abnormal behavior noted. Will continue to monitor with security cameras. Q 15 minute rounds continue. 

## 2014-08-03 NOTE — ED Notes (Signed)
Pt. Up and using bathroom. 

## 2014-08-03 NOTE — ED Notes (Signed)
Lunch tray given to pt.  Pt calm and cooperative. In room eating.  

## 2014-08-04 MED ORDER — LORAZEPAM 0.5 MG PO TABS
ORAL_TABLET | ORAL | Status: AC
  Filled 2014-08-04: qty 1

## 2014-08-04 MED ORDER — METOPROLOL TARTRATE 25 MG PO TABS
ORAL_TABLET | ORAL | Status: AC
Start: 2014-08-04 — End: 2014-08-04
  Filled 2014-08-04: qty 1

## 2014-08-04 MED ORDER — SERTRALINE HCL 100 MG PO TABS
ORAL_TABLET | ORAL | Status: AC
  Filled 2014-08-04: qty 1

## 2014-08-04 MED ORDER — SERTRALINE HCL 50 MG PO TABS
ORAL_TABLET | ORAL | Status: AC
  Administered 2014-08-04: 100 mg via ORAL
  Filled 2014-08-04: qty 2

## 2014-08-04 MED ORDER — LEVETIRACETAM 500 MG PO TABS
ORAL_TABLET | ORAL | Status: AC
  Administered 2014-08-04: 750 mg
  Filled 2014-08-04: qty 2

## 2014-08-04 MED ORDER — LEVETIRACETAM 500 MG PO TABS
ORAL_TABLET | ORAL | Status: AC
  Administered 2014-08-04: 750 mg via ORAL
  Filled 2014-08-04: qty 2

## 2014-08-04 MED ORDER — METOPROLOL TARTRATE 25 MG PO TABS
ORAL_TABLET | ORAL | Status: AC
  Administered 2014-08-04: 25 mg via ORAL
  Filled 2014-08-04: qty 1

## 2014-08-04 NOTE — ED Notes (Signed)
BEHAVIORAL HEALTH ROUNDING Patient sleeping: No. Patient alert and oriented: yes Behavior appropriate: Yes.  ; If no, describe:  Nutrition and fluids offered: Yes  Toileting and hygiene offered: Yes  Sitter present: q15 minute observations  Law enforcement present: yes  ODS [employer]

## 2014-08-04 NOTE — ED Notes (Signed)
Pt sitting in the dayroom recliner  NAD assessed

## 2014-08-04 NOTE — ED Notes (Signed)
BEHAVIORAL HEALTH ROUNDING Patient sleeping: Yes.   Patient alert and oriented: eyes closed  - appears to be sleeping Behavior appropriate: Yes.  ; If no, describe:  Nutrition and fluids offered: Yes  Toileting and hygiene offered: appears to be sleeping Sitter present: q15 minute observations  - security cameras being monitored Law enforcement present:  Yes  ODS  [employer]

## 2014-08-04 NOTE — ED Notes (Signed)
BEHAVIORAL HEALTH ROUNDING Patient sleeping: No. Patient alert and oriented: yes Behavior appropriate: Yes.  ; If no, describe:  Nutrition and fluids offered: Yes  Toileting and hygiene offered: Yes  Sitter present: q15 minute observations Law enforcement present: yes  ODS 

## 2014-08-04 NOTE — ED Notes (Signed)
Breakfast provided  Pt lying in bed  Awakens as I enter but does not sit up  NAD  Denies pain  Continue to monitor

## 2014-08-04 NOTE — ED Notes (Signed)
ED BHU PLACEMENT JUSTIFICATION Is the patient under IVC or is there intent for IVC: Yes.   Is the patient medically cleared: Yes.   Is there vacancy in the ED BHU: Yes.   Is the population mix appropriate for patient: Yes.   Is the patient awaiting placement in inpatient or outpatient setting: Yes.   Has the patient had a psychiatric consult: Yes.   Survey of unit performed for contraband, proper placement and condition of furniture, tampering with fixtures in bathroom, shower, and each patient room: Yes.  ; Findings:  APPEARANCE/BEHAVIOR Calm and cooperative NEURO ASSESSMENT Orientation: oriented x3  Hallucinations: No.None noted (Hallucinations) Speech: Normal Gait: normal RESPIRATORY ASSESSMENT Even, unlabored respirations  CARDIOVASCULAR ASSESSMENT regular rate and rhythm, S1, S2 normal, no murmur, click, rub or gallop and pulses equal  regular rate  skin warm and dry GASTROINTESTINAL ASSESSMENT no GI complaint EXTREMITIES Full ROM PLAN OF CARE Provide calm/safe environment. Vital signs assessed twice daily. ED BHU Assessment once each 12-hour shift. Collaborate with intake RN daily or as condition indicates. Assure the ED provider has rounded once each shift. Provide and encourage hygiene. Provide redirection as needed. Assess for escalating behavior; address immediately and inform ED provider.  Assess family dynamic and appropriateness for visitation as needed: Yes.  ; If necessary, describe findings:  Educate the patient/family about BHU procedures/visitation: Yes.  ; If necessary, describe findings:

## 2014-08-04 NOTE — ED Notes (Signed)
Pt. Noted sleeping in room. No complaints or concerns voiced. No distress or abnormal behavior noted. Will continue to monitor with security cameras. Q 15 minute rounds continue. 

## 2014-08-04 NOTE — ED Notes (Signed)

## 2014-08-04 NOTE — ED Provider Notes (Signed)
-----------------------------------------   7:27 AM on 08/04/2014 -----------------------------------------   BP 98/48 mmHg  Pulse 66  Temp(Src) 98.2 F (36.8 C) (Oral)  Resp 20  Ht 5\' 8"  (1.727 m)  Wt 150 lb (68.04 kg)  BMI 22.81 kg/m2  SpO2 98%  The patient had no acute events since last update.  Calm and cooperative at this time.  Disposition is pending per Psychiatry/Behavioral Medicine team recommendations.     Myrna Blazeravid Matthew Barnell Shieh, MD 08/04/14 [phone]

## 2014-08-04 NOTE — ED Notes (Signed)
Pt lying in bed with his eyes closed  NAD  Even, unlabored respirations observed

## 2014-08-04 NOTE — ED Notes (Signed)
Patient eating [place] sandwich tray, graham crackers, peanut butter and ginger ale. Denies any complaints at this time. Will continue to monitor.

## 2014-08-04 NOTE — ED Notes (Signed)
PT  SEEN  BY  DR  CLAPACS  ON 08/04/14  IN HIS  NOTES  HE  STATED   TREATMENT TEAM STILL  WORKING ON  FINDING  PLACEMENT.  ALL  IVC  PAPER  ON  CHART  PT  HAS 2ND  SET OF  IVC  PAPERS

## 2014-08-04 NOTE — ED Notes (Signed)
Supper provided  

## 2014-08-04 NOTE — ED Notes (Signed)

## 2014-08-04 NOTE — ED Notes (Signed)
ED BHU PLACEMENT JUSTIFICATION Is the patient under IVC or is there intent for IVC: Yes.   Is the patient medically cleared: Yes.   Is there vacancy in the ED BHU: Yes.   Is the population mix appropriate for patient: Yes.   Is the patient awaiting placement in inpatient or outpatient setting: Yes.   Has the patient had a psychiatric consult: Yes.   Survey of unit performed for contraband, proper placement and condition of furniture, tampering with fixtures in bathroom, shower, and each patient room: Yes.  ; Findings:  APPEARANCE/BEHAVIOR Calm and cooperative. NEURO ASSESSMENT Orientation: oriented x3,denies pain Hallucinations: No.None noted (Hallucinations) Speech: Normal Gait: normal RESPIRATORY ASSESSMENT Respirations even and unlabored noted. CARDIOVASCULAR ASSESSMENT regular rate, pulses equal, skin warm and dry. GASTROINTESTINAL ASSESSMENT No GI complaints at this time. EXTREMITIES Full ROM PLAN OF CARE Provide calm/safe environment. Vital signs assessed twice daily. ED BHU Assessment once each 12-hour shift. Collaborate with intake RN daily or as condition indicates. Assure the ED provider has rounded once each shift. Provide and encourage hygiene. Provide redirection as needed. Assess for escalating behavior; address immediately and inform ED provider.  Assess family dynamic and appropriateness for visitation as needed: Yes.  ; If necessary, describe findings:  Educate the patient/family about BHU procedures/visitation: Yes.  ; If necessary, describe findings:  

## 2014-08-04 NOTE — ED Notes (Signed)
BEHAVIORAL HEALTH ROUNDING Patient sleeping: No. Patient alert and oriented: yes Behavior appropriate: Yes.  ; If no, describe:  Nutrition and fluids offered: Yes  Toileting and hygiene offered: Yes  Sitter present: q15 minute checks Law enforcement present: yes  ODS

## 2014-08-04 NOTE — ED Notes (Signed)
Pt lying in bed with his eyes closed  Appears to be sleeping

## 2014-08-04 NOTE — ED Notes (Signed)

## 2014-08-04 NOTE — ED Notes (Signed)
Pt is sitting in a recliner in the dayroom watching Tv  NAD observed  Pt with no verbalized needs or concerns at this time

## 2014-08-04 NOTE — ED Provider Notes (Signed)
-----------------------------------------   11:34 PM on 08/04/2014 -----------------------------------------   BP 113/63 mmHg  Pulse 87  Temp(Src) 98.2 F (36.8 C) (Oral)  Resp 20  Ht 5\' 8"  (1.727 m)  Wt 150 lb (68.04 kg)  BMI 22.81 kg/m2  SpO2 100%  The patient had no acute events since last update.  Calm and cooperative at this time.  Disposition is pending per Psychiatry/Behavioral Medicine team recommendations.     Rebecka ApleyAllison P Webster, MD 08/04/14 [phone]

## 2014-08-04 NOTE — ED Notes (Signed)
Lunch provided along with an extra drink   

## 2014-08-04 NOTE — ED Notes (Signed)
Pt lying in bed with his eyes closed  NAD assessed  Even, unlabored respirations observed

## 2014-08-04 NOTE — Consult Note (Signed)
Psychiatry: Follow-up for this young man with autism and behavioral disturbance and probable intellectual disability. No new complaints today. His only request is that we find him a group home where he can be safe. Patient on review of systems denies all physical complaints. Denies hallucinations or suicidal or homicidal ideation.  On physical exam he is reasonably well groomed but still has body odor. Eye contact intermittent. Psychomotor activity generally calm. Speech is decreased in total mild flat somewhat slurred all of which is baseline. Denies suicidal or homicidal ideation. Denies hallucinations. Doesn't appear to be obviously responding to internal stimuli.  No change to baseline diagnosis. No change in medication. Supportive counseling completed. Treatment team still working on finding appropriate placement

## 2014-08-05 MED ORDER — CARBAMAZEPINE 200 MG PO TABS
ORAL_TABLET | ORAL | Status: AC
  Administered 2014-08-05: 800 mg
  Filled 2014-08-05: qty 4

## 2014-08-05 MED ORDER — CARBAMAZEPINE 200 MG PO TABS
ORAL_TABLET | ORAL | Status: AC
  Filled 2014-08-05: qty 4

## 2014-08-05 MED ORDER — METOPROLOL TARTRATE 25 MG PO TABS
ORAL_TABLET | ORAL | Status: AC
  Filled 2014-08-05: qty 1

## 2014-08-05 MED ORDER — SERTRALINE HCL 100 MG PO TABS
ORAL_TABLET | ORAL | Status: AC
  Filled 2014-08-05: qty 1

## 2014-08-05 MED ORDER — LEVETIRACETAM 500 MG PO TABS
ORAL_TABLET | ORAL | Status: AC
  Filled 2014-08-05: qty 1

## 2014-08-05 MED ORDER — LORAZEPAM 0.5 MG PO TABS
ORAL_TABLET | ORAL | Status: AC
  Filled 2014-08-05: qty 1

## 2014-08-05 NOTE — ED Notes (Signed)
Pt continues to lie in the bed  Eyes closed  NAD observed  Continue to monitor

## 2014-08-05 NOTE — ED Notes (Signed)
Pt lying in the bed watching TV

## 2014-08-05 NOTE — ED Notes (Signed)
Lunch provided    Pt with no verbalized needs or concerns at this time  NAD continue to monitor

## 2014-08-05 NOTE — ED Notes (Signed)
Pt lying in bed with his eyes closed  NAD observed  Continue to monitor

## 2014-08-05 NOTE — ED Notes (Signed)
BEHAVIORAL HEALTH ROUNDING Patient sleeping: yes. Patient alert and oriented: appears asleep Behavior appropriate: Yes.  ; If no, describe:  Nutrition and fluids offered: yes Toileting and hygiene offered: Yes  Sitter present: q15 minute observations and security camera monitoring Law enforcement present: Yes  ODS  
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
## 2014-08-05 NOTE — ED Provider Notes (Signed)
-----------------------------------------   8:43 AM on 08/05/2014 -----------------------------------------   BP 113/63 mmHg  Pulse 87  Temp(Src) 98.2 F (36.8 C) (Oral)  Resp 20  Ht 5\' 8"  (1.727 m)  Wt 150 lb (68.04 kg)  BMI 22.81 kg/m2  SpO2 100%  I have seen the patient and reviewed the patients recent care with the nursing staff.  The patient has had no acute events since last update.  The patient is calm and cooperative at this time.  Disposition is pending per Psychiatry/Behavioral Medicine team recommendations.    Darien Ramusavid W Olumide Dolinger, MD 08/05/14 [phone]

## 2014-08-05 NOTE — ED Notes (Addendum)
BEHAVIORAL HEALTH ROUNDING Patient sleeping: yes. Patient alert and oriented: appears asleep Behavior appropriate: Yes.  ; If no, describe:  Nutrition and fluids offered: yes Toileting and hygiene offered: Yes  Sitter present: q15 minute observations and security camera monitoring Law enforcement present: Yes  ODS

## 2014-08-05 NOTE — ED Notes (Signed)
Pt is sitting in a recliner in the dayroom  Pt observed with no unusual behavior  Appropriate to stimulation  No verbalized needs or concerns at this time  NAD assessed  Continue to monitor 

## 2014-08-05 NOTE — ED Notes (Signed)
BEHAVIORAL HEALTH ROUNDING Patient sleeping: No. Patient alert and oriented: yes Behavior appropriate: Yes.  ; If no, describe:  Nutrition and fluids offered: yes Toileting and hygiene offered: Yes  Sitter present: q15 minute observations and security camera monitoring Law enforcement present: Yes  ODS  

## 2014-08-05 NOTE — ED Notes (Signed)

## 2014-08-05 NOTE — ED Notes (Signed)
Pt observed sitting in a recliner playing [place] with another pt  NAD assessed  Continue to monitor

## 2014-08-05 NOTE — ED Notes (Signed)
Pt observed lying in bed with his eyes closed   Even, unlabored respirations observed  NAD  continue to monitor

## 2014-08-05 NOTE — ED Notes (Signed)
Patient observed lying in bed with eyes closed  Even, unlabored respirations observed   NAD pt appears to be sleeping  I will continue to monitor along with every 15 minute visual observations and ongoing security camera monitoring    

## 2014-08-05 NOTE — ED Notes (Signed)
Supper provided  

## 2014-08-05 NOTE — ED Notes (Signed)
Pt observed lying in bed watching TV   NAD assessed

## 2014-08-05 NOTE — ED Notes (Signed)
Meds administered as ordered

## 2014-08-05 NOTE — ED Notes (Addendum)
BEHAVIORAL HEALTH ROUNDING Patient sleeping: yes. Patient alert and oriented: appears asleep Behavior appropriate: Yes.  ; If no, describe:  Nutrition and fluids offered: yes Toileting and hygiene offered: Yes  Sitter present: q15 minute observations and security camera monitoring Law enforcement present: Yes  ODS  

## 2014-08-05 NOTE — ED Notes (Signed)
Breakfast provided  Pt continues to lie in bed with his eyes closed  NAD

## 2014-08-05 NOTE — ED Notes (Addendum)

## 2014-08-05 NOTE — ED Notes (Signed)

## 2014-08-05 NOTE — ED Notes (Signed)
BEHAVIORAL HEALTH ROUNDING Patient sleeping: Yes.   Patient alert and oriented: not applicable Behavior appropriate: Yes.    Nutrition and fluids offered: No Toileting and hygiene offered: No Sitter present: yes Law enforcement present: Yes [employer] 

## 2014-08-05 NOTE — ED Notes (Signed)

## 2014-08-05 NOTE — Consult Note (Signed)
  Psychiatry: Follow-up for this gentleman with mental retardation and autistic spectrum disorder. No new complaints today. No sign of seizures. Not acting out no violence or threatening behavior. On review of systems he denies hallucinations denies suicidal ideation. On mental status exam he continues to have normal psychomotor activity. Speech quiet and decreased in amount. Affect blunted. Thoughts confused but not bizarre. Generally cooperative.  No indication to change any medication right now. Because of his multiple episodes of aggression he has become extremely hard place. Still working with treatment team in the emergency room on appropriate placement. Does not require inpatient hospitalization.

## 2014-08-05 NOTE — ED Notes (Signed)
Pt has been writing  -* he has made a wish list of things that he would like for his birthday  Pt observed with no unusual behavior  Appropriate to stimulation  No verbalized needs or concerns at this time  NAD assessed  Continue to monitor

## 2014-08-06 MED ORDER — SERTRALINE HCL 100 MG PO TABS
ORAL_TABLET | ORAL | Status: AC
  Filled 2014-08-06: qty 1

## 2014-08-06 MED ORDER — SERTRALINE HCL 100 MG PO TABS
ORAL_TABLET | ORAL | Status: AC
  Administered 2014-08-06: 100 mg via ORAL
  Filled 2014-08-06: qty 1

## 2014-08-06 NOTE — ED Notes (Signed)
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
## 2014-08-06 NOTE — ED Notes (Signed)
ED BHU PLACEMENT JUSTIFICATION Is the patient under IVC or is there intent for IVC: Yes.   Is the patient medically cleared: Yes.   Is there vacancy in the ED BHU: No. Is the population mix appropriate for patient: Yes.   Is the patient awaiting placement in inpatient or outpatient setting: Yes.   Has the patient had a psychiatric consult: Yes.   Survey of unit performed for contraband, proper placement and condition of furniture, tampering with fixtures in bathroom, shower, and each patient room: Yes.  ; Findings:  APPEARANCE/BEHAVIOR calm and cooperative NEURO ASSESSMENT Orientation: time and place Hallucinations: No.None noted (Hallucinations) Speech: Normal Gait: normal RESPIRATORY ASSESSMENT No distress CARDIOVASCULAR ASSESSMENT Regular rate GASTROINTESTINAL ASSESSMENT No distress EXTREMITIES gait was normal for age PLAN OF CARE Provide calm/safe environment. Vital signs assessed twice daily. ED BHU Assessment once each 12-hour shift. Collaborate with intake RN daily or as condition indicates. Assure the ED provider has rounded once each shift. Provide and encourage hygiene. Provide redirection as needed. Assess for escalating behavior; address immediately and inform ED provider.  Assess family dynamic and appropriateness for visitation as needed: Yes.  ; If necessary, describe findings:  Educate the patient/family about BHU procedures/visitation: No.; If necessary, describe findings:

## 2014-08-06 NOTE — ED Notes (Signed)
Pt observed sitting in a recliner in the dayroom  - visiting with other pt's  Pt observed with no unusual behavior  Appropriate to stimulation  No verbalized needs or concerns at this time  NAD assessed  Continue to monitor

## 2014-08-06 NOTE — ED Notes (Signed)
BEHAVIORAL HEALTH ROUNDING Patient sleeping: No. Patient alert and oriented: yes Behavior appropriate: Yes.  ; If no, describe:  Nutrition and fluids offered: yes Toileting and hygiene offered: Yes  Sitter present: q15 minute observations and security camera monitoring Law enforcement present: Yes  ODS  

## 2014-08-06 NOTE — ED Notes (Signed)
BEHAVIORAL HEALTH ROUNDING Patient sleeping: Yes.   Patient alert and oriented: not applicable Behavior appropriate: Yes.    Nutrition and fluids offered: No Toileting and hygiene offered: No Sitter present: q15 minute observations and security camera monitoring Law enforcement present: Yes [employer] 

## 2014-08-06 NOTE — ED Notes (Signed)
BEHAVIORAL HEALTH ROUNDING Patient sleeping: No. Patient alert and oriented: yes Behavior appropriate: Yes.  ; If no, describe:  Nutrition and fluids offered: Yes  Toileting and hygiene offered: Yes  Sitter present: no Law enforcement present: Yes  

## 2014-08-06 NOTE — Progress Notes (Signed)
Patient was visited by Ms. Torraine for an interview for potential placement.     CSW [facility] reporting Ms. Torraine has denied the patient for placement.  She reports [facility] patient and now awaiting a decision.     Maryelizabeth Rowanressa Sophee Mckimmy, MSW, LCSW, LCAS Clinical Social Worker [phone]

## 2014-08-06 NOTE — ED Notes (Signed)
Pt observed lying in bed watching TV  Pt observed with no unusual behavior  Appropriate to stimulation  No verbalized needs or concerns at this time  NAD assessed  Continue to monitor 

## 2014-08-06 NOTE — Progress Notes (Signed)
CSW spoke with Dr. Lucianne MussKumar about this patient and requested some assistance with communicating with [facility] for reconsideration of the needed wavier.     [facility].     Maryelizabeth Rowanressa Zachari Alberta, MSW, LCSW, LCAS-A Clinical Social Worker [phone]

## 2014-08-06 NOTE — Progress Notes (Signed)
[facility] who reports the wavier funding had not been approved yet so she will continue to search for a family care home placement.     CSW presented this information to treatment for therapeutic suggestion to better service this patient.     Maryelizabeth Rowanressa Cameryn Schum, MSW, LCSW, LCAS-A Clinical Social Worker [phone]

## 2014-08-06 NOTE — ED Notes (Signed)
Lunch provided  Pt observed with no unusual behavior  Appropriate to stimulation  No verbalized needs or concerns at this time  NAD assessed  Continue to monitor 

## 2014-08-06 NOTE — Consult Note (Signed)
    Psychiatry: Follow-up for this 24 year old man with a history of autism. No new complaints today. Behavior calm. No complaints on review of systems. On mental status patient is calm no behavior problems. Denies suicidal ideation. Not hostile or threatening.  Tolerating medicine well. No seizure activity no agitation. Patient is in the emergency room entirely for placement at this point. No change treatment plan

## 2014-08-06 NOTE — ED Notes (Signed)
Report received from Amy T,RN 

## 2014-08-06 NOTE — ED Notes (Signed)
assessment completed  Pt denies pain

## 2014-08-06 NOTE — ED Notes (Signed)
Supper provided  Pt observed with no unusual behavior  Appropriate to stimulation  No verbalized needs or concerns at this time  NAD assessed  Continue to monitor 

## 2014-08-06 NOTE — ED Provider Notes (Signed)
-----------------------------------------   5:34 PM on 08/06/2014 -----------------------------------------   BP 90/44 mmHg  Pulse 74  Temp(Src) 97.9 F (36.6 C) (Oral)  Resp 20  Ht 5\' 8"  (1.727 m)  Wt 150 lb (68.04 kg)  BMI 22.81 kg/m2  SpO2 98%  The patient had no acute events since last update.  Calm and cooperative at this time.  Disposition is pending per Psychiatry/Behavioral Medicine team recommendations.  BP sl low. Will follow, these vitals were taken during pt rest period as I understand.    Arnaldo NatalPaul F Malinda, MD 08/06/14 [phone]

## 2014-08-06 NOTE — ED Notes (Signed)
Paper and crayons provided to pt at this time   Pt observed with no unusual behavior  Appropriate to stimulation  No verbalized needs or concerns at this time  NAD assessed  Continue to monitor

## 2014-08-06 NOTE — ED Provider Notes (Signed)
-----------------------------------------   7:06 AM on 08/06/2014 -----------------------------------------   BP 128/79 mmHg  Pulse 89  Temp(Src) 98.4 F (36.9 C) (Oral)  Resp 18  Ht 5\' 8"  (1.727 m)  Wt 150 lb (68.04 kg)  BMI 22.81 kg/m2  SpO2 98%  The patient had no acute events since last update.  Calm and cooperative at this time.  Disposition is pending per Psychiatry/Behavioral Medicine team recommendations.     Irean HongJade J Sung, MD 08/06/14 [phone]

## 2014-08-06 NOTE — ED Notes (Addendum)

## 2014-08-06 NOTE — ED Notes (Signed)
ED BHU PLACEMENT JUSTIFICATION Is the patient under IVC or is there intent for IVC: Yes. Is the patient medically cleared: Yes.   Is there vacancy in the ED BHU: Yes.   Is the population mix appropriate for patient: Yes. Is the patient awaiting placement in inpatient or outpatient setting: Yes waiting for group home placement. Has the patient had a psychiatric consult: Yes.   Survey of unit performed for contraband, proper placement and condition of furniture, tampering with fixtures in bathroom, shower, and each patient room: Yes.  ; Findings: none APPEARANCE/BEHAVIOR calm NEURO ASSESSMENT Orientation: person Hallucinations: No.None noted (Hallucinations) Speech: Normal Gait: normal RESPIRATORY ASSESSMENT No respiratory distress noted CARDIOVASCULAR ASSESSMENT Skin color appropriate for age and race GASTROINTESTINAL ASSESSMENT no GI distress noted EXTREMITIES Moves all extremities PLAN OF CARE Provide calm/safe environment. Vital signs assessed twice daily. ED BHU Assessment once each 12-hour shift. Collaborate with intake RN daily or as condition indicates. Assure the ED provider has rounded once each shift. Provide and encourage hygiene. Provide redirection as needed. Assess for escalating behavior; address immediately and inform ED provider.  Assess family dynamic and appropriateness for visitation as needed: Yes.   Educate the patient/family about BHU procedures/visitation: Yes.

## 2014-08-06 NOTE — ED Notes (Signed)
Pt observed with no unusual behavior  Appropriate to stimulation  No verbalized needs or concerns at this time  NAD assessed  Continue to monitor 

## 2014-08-07 MED ORDER — SERTRALINE HCL 100 MG PO TABS
ORAL_TABLET | ORAL | Status: AC
  Filled 2014-08-07: qty 1

## 2014-08-07 MED ORDER — SERTRALINE HCL 100 MG PO TABS
ORAL_TABLET | ORAL | Status: AC
  Administered 2014-08-07: 100 mg via ORAL
  Filled 2014-08-07: qty 1

## 2014-08-07 MED ORDER — CARBAMAZEPINE 200 MG PO TABS
ORAL_TABLET | ORAL | Status: AC
  Filled 2014-08-07: qty 4

## 2014-08-07 NOTE — ED Notes (Signed)
Pt resting in bed with eyes closed. No unusual behavior observed. Pt has no needs or concerns at this time. Will continue to monitor and f/u as needed.  

## 2014-08-07 NOTE — ED Notes (Signed)
BEHAVIORAL HEALTH ROUNDING Patient sleeping: Yes.   Patient alert and oriented: asleep Behavior appropriate: Yes.  ; If no, describe:  Nutrition and fluids offered: asleep Toileting and hygiene offered: asleep Sitter present: no Law enforcement present: Yes, ODS 

## 2014-08-07 NOTE — Progress Notes (Signed)
Call to Patient's care coordinator [facility] [phone] to follow up on possible placement with [facility] [place].  States patient is still under review and she will continue to facilitate the placement.  Will notify CSW once she has an answer.    CSW will continue to follow patient and assist with ongoing needs.  Sammuel Hineseborah Harald Quevedo. Theresia MajorsLCSWA, MSW Clinical Social Work Department Emergency Room [phone] 9:40 AM

## 2014-08-07 NOTE — ED Notes (Signed)

## 2014-08-07 NOTE — ED Notes (Signed)
BEHAVIORAL HEALTH ROUNDING Patient sleeping: No. Patient alert and oriented: yes Behavior appropriate: Yes.  ; If no, describe:  Nutrition and fluids offered: No Toileting and hygiene offered: Yes  Sitter present: no Law enforcement present: Yes  

## 2014-08-07 NOTE — ED Provider Notes (Signed)
-----------------------------------------   2:57 AM on 08/07/2014 -----------------------------------------   BP 122/65 mmHg  Pulse 87  Temp(Src) 98.4 F (36.9 C) (Oral)  Resp 18  Ht 5\' 8"  (1.727 m)  Wt 150 lb (68.04 kg)  BMI 22.81 kg/m2  SpO2 96%  The patient had no acute events since last update.  Calm and cooperative at this time.  Disposition is pending per Psychiatry/Behavioral Medicine team recommendations.     Darci Currentandolph N Ludwig Tugwell, MD 08/07/14 [phone]

## 2014-08-07 NOTE — ED Notes (Signed)
BEHAVIORAL HEALTH ROUNDING Patient sleeping: No. Patient alert and oriented: yes Behavior appropriate: Yes.  ; If no, describe:  Nutrition and fluids offered: Yes  Toileting and hygiene offered: Yes  Sitter present: no Law enforcement present: Yes  

## 2014-08-07 NOTE — ED Notes (Signed)
BEHAVIORAL HEALTH ROUNDING Patient sleeping: No. Patient alert and oriented:alert Behavior appropriate: Yes.  ; If no, describe:  Nutrition and fluids offered: Yes  Toileting and hygiene offered: Yes  Sitter present: no Law enforcement present: Yes

## 2014-08-07 NOTE — ED Notes (Signed)

## 2014-08-07 NOTE — ED Provider Notes (Signed)
-----------------------------------------   7:09 AM on 08/07/2014 -----------------------------------------   BP 122/65 mmHg  Pulse 87  Temp(Src) 98.4 F (36.9 C) (Oral)  Resp 18  Ht 5\' 8"  (1.727 m)  Wt 150 lb (68.04 kg)  BMI 22.81 kg/m2  SpO2 96%  The patient had no acute events since last update.  Calm and cooperative at this time.  Disposition is pending per Psychiatry/Behavioral Medicine team recommendations.  Labs and vitals have been reviewed.  Minna AntisKevin Portland Sarinana, MD 08/07/14 [phone]

## 2014-08-07 NOTE — ED Notes (Signed)
PT  IVC  ALL PAPERWORK ON  CHART  PT  SEEN  BY  DR  CLAPACS  ON  08/07/14 PENDING  PLACEMENT

## 2014-08-07 NOTE — ED Notes (Signed)
Pt provided with sandwich tray and drink.  

## 2014-08-07 NOTE — ED Provider Notes (Signed)
-----------------------------------------   11:13 PM on 08/07/2014 -----------------------------------------   BP 94/53 mmHg  Pulse 71  Temp(Src) 98.2 F (36.8 C) (Oral)  Resp 18  Ht 5\' 8"  (1.727 m)  Wt 150 lb (68.04 kg)  BMI 22.81 kg/m2  SpO2 100%  The patient had no acute events since last update.  Calm and cooperative at this time.  Disposition is pending per Psychiatry/Behavioral Medicine team recommendations.     Rebecka ApleyAllison P Webster, MD 08/07/14 [phone]

## 2014-08-07 NOTE — ED Notes (Signed)
BEHAVIORAL HEALTH ROUNDING Patient sleeping: Yes.   Patient alert and oriented: yes Behavior appropriate: Yes.  ; If no, describe: sleeping Nutrition and fluids offered: No Toileting and hygiene offered: Yes  Sitter present: no Law enforcement present: Yes  

## 2014-08-07 NOTE — ED Notes (Signed)
Pt awake , lying in bed watching TV, No signs of distress

## 2014-08-07 NOTE — ED Notes (Signed)
ED BHU PLACEMENT JUSTIFICATION Is the patient under IVC or is there intent for IVC: Yes.   Is the patient medically cleared: Yes.   Is there vacancy in the ED BHU: Yes.   Is the population mix appropriate for patient: Yes.   Is the patient awaiting placement in inpatient or outpatient setting: Yes.   Has the patient had a psychiatric consult: Yes.   Survey of unit performed for contraband, proper placement and condition of furniture, tampering with fixtures in bathroom, shower, and each patient room: Yes.  ; Findings:  APPEARANCE/BEHAVIOR calm and cooperative NEURO ASSESSMENT Orientation: time, place and person Hallucinations: No.None noted (Hallucinations) Speech: Normal Gait: normal RESPIRATORY ASSESSMENT No distress CARDIOVASCULAR ASSESSMENT Regular rate GASTROINTESTINAL ASSESSMENT No distress EXTREMITIES no deformities, ROM of all joints is normal, gait was normal for age PLAN OF CARE Provide calm/safe environment. Vital signs assessed twice daily. ED BHU Assessment once each 12-hour shift. Collaborate with intake RN daily or as condition indicates. Assure the ED provider has rounded once each shift. Provide and encourage hygiene. Provide redirection as needed. Assess for escalating behavior; address immediately and inform ED provider.  Assess family dynamic and appropriateness for visitation as needed: Yes.  ; If necessary, describe findings:  Educate the patient/family about BHU procedures/visitation: Yes.  ; If necessary, describe findings:  

## 2014-08-07 NOTE — Consult Note (Signed)
Psychiatry: Follow-up for this young man with autism and developmental disability. No new complaints today. On review of systems he does not report any physical complaints. No new mental health complaints. Denies suicidal ideation.  Patient keeps himself reasonably clean. Mostly spends time sleeping or else watching television. Eye contact intermittent psychomotor activity a little slowed but normal for him. Speech decreased in amount. A little slurred sometimes difficult to understand affect blunted. Denies suicidal ideation. Doesn't appear to be responding to internal stimuli. He has not been violent or threatening today.  Patient is awaiting placement. He has failed multiple placements because of his violent behavior. Here in the hospital his behavior has been perfectly fine and easy to manage. No change indicated to medicine. Supportive counseling completed

## 2014-08-07 NOTE — ED Notes (Signed)
Pt report received from Amy T RN. Pt care assumed. Pt sitting in day room talking with other pts watching tv.

## 2014-08-07 NOTE — ED Notes (Signed)
ED BHU PLACEMENT JUSTIFICATION Is the patient under IVC or is there intent for IVC: Yes.   Is the patient medically cleared: Yes.   Is there vacancy in the ED BHU: Yes.   Is the population mix appropriate for patient: Yes.   Is the patient awaiting placement in inpatient or outpatient setting: Yes.   Has the patient had a psychiatric consult: Yes.   Survey of unit performed for contraband, proper placement and condition of furniture, tampering with fixtures in bathroom, shower, and each patient room: Yes.  ; Findings:  APPEARANCE/BEHAVIOR cooperative NEURO ASSESSMENT Orientation: AAO x3 Hallucinations: No.None noted (Hallucinations) Speech: Normal Gait: normal RESPIRATORY ASSESSMENT Even and unlabored  CARDIOVASCULAR ASSESSMENT Heart rate wnl, skin warm and dry, skin color wnl GASTROINTESTINAL ASSESSMENT wnl EXTREMITIES Moves all extrameties PLAN OF CARE Provide calm/safe environment. Vital signs assessed twice daily. ED BHU Assessment once each 12-hour shift. Collaborate with intake RN daily or as condition indicates. Assure the ED provider has rounded once each shift. Provide and encourage hygiene. Provide redirection as needed. Assess for escalating behavior; address immediately and inform ED provider.  Assess family dynamic and appropriateness for visitation as needed: Yes.  ; If necessary, describe findings:  Educate the patient/family about BHU procedures/visitation: Yes.  ; If necessary, describe findings:

## 2014-08-07 NOTE — ED Notes (Signed)
BEHAVIORAL HEALTH ROUNDING Patient sleeping: No. Patient alert and oriented: yes Behavior appropriate: Yes.  ; If no, describe:  Nutrition and fluids offered: Yes  Toileting and hygiene offered: Yes  Sitter present: no Law enforcement present: Yes, ODS 

## 2014-08-08 MED ORDER — SERTRALINE HCL 100 MG PO TABS
ORAL_TABLET | ORAL | Status: AC
  Administered 2014-08-08: 100 mg
  Filled 2014-08-08: qty 1

## 2014-08-08 MED ORDER — SERTRALINE HCL 100 MG PO TABS
ORAL_TABLET | ORAL | Status: AC
  Administered 2014-08-08: 100 mg via ORAL
  Filled 2014-08-08: qty 1

## 2014-08-08 NOTE — ED Notes (Signed)
BEHAVIORAL HEALTH ROUNDING Patient sleeping: No. Patient alert and oriented: yes Behavior appropriate: Yes.  ; If no, describe:  Nutrition and fluids offered: Yes  Toileting and hygiene offered: Yes  Sitter present: not applicable Law enforcement present: Yes  

## 2014-08-08 NOTE — ED Notes (Signed)
BEHAVIORAL HEALTH ROUNDING Patient sleeping: Yes.   Patient alert and oriented: yes Behavior appropriate: Yes.  ; If no, describe: sleeping Nutrition and fluids offered: No Toileting and hygiene offered: Yes  Sitter present: no Law enforcement present: Yes  

## 2014-08-08 NOTE — ED Notes (Signed)
BEHAVIORAL HEALTH ROUNDING Patient sleeping: Yes.   Patient alert and oriented: no Behavior appropriate: Yes.  ; If no, describe:  Nutrition and fluids offered: No Toileting and hygiene offered: No Sitter present: not applicable Law enforcement present: Yes  

## 2014-08-08 NOTE — ED Notes (Signed)
BEHAVIORAL HEALTH ROUNDING Patient sleeping: No. Patient alert and oriented: yes Behavior appropriate: Yes.  ; If no, describe:  Nutrition and fluids offered: Yes  Toileting and hygiene offered: Yes  Sitter present: no Law enforcement present: Yes  

## 2014-08-08 NOTE — ED Notes (Signed)
Pt up to restroom.

## 2014-08-08 NOTE — ED Notes (Signed)
BEHAVIORAL HEALTH ROUNDING Patient sleeping: Yes.   Patient alert and oriented: not applicable Behavior appropriate: Yes.   Nutrition and fluids offered: Yes  Toileting and hygiene offered: Yes  Sitter present: yes Law enforcement present: Yes  

## 2014-08-08 NOTE — ED Notes (Signed)
BEHAVIORAL HEALTH ROUNDING Patient sleeping: Yes.   Patient alert and oriented: no Behavior appropriate: Yes.  ; If no, describe:  Nutrition and fluids offered: Yes  Toileting and hygiene offered: Yes  Sitter present: not applicable Law enforcement present: Yes ODS/shift

## 2014-08-08 NOTE — ED Notes (Signed)
BEHAVIORAL HEALTH ROUNDING Patient sleeping: No. Patient alert and oriented: yes Behavior appropriate: Yes.  ; If no, describe:  Nutrition and fluids offered: No Toileting and hygiene offered: Yes  Sitter present: not applicable Law enforcement present: Yes  

## 2014-08-08 NOTE — ED Provider Notes (Signed)
-----------------------------------------   8:26 AM on 08/08/2014 -----------------------------------------   BP 94/53 mmHg  Pulse 71  Temp(Src) 98.2 F (36.8 C) (Oral)  Resp 18  Ht 5\' 8"  (1.727 m)  Wt 150 lb (68.04 kg)  BMI 22.81 kg/m2  SpO2 100%  No acute events since last update.  Acting appropriately.  Disposition is pending per Psychiatry/Behavioral Medicine team recommendations.     Phineas SemenGraydon Aidyn Kellis, MD 08/08/14 [phone]

## 2014-08-09 MED ORDER — SERTRALINE HCL 100 MG PO TABS
ORAL_TABLET | ORAL | Status: AC
  Filled 2014-08-09: qty 1

## 2014-08-09 NOTE — ED Notes (Signed)
BEHAVIORAL HEALTH ROUNDING Patient sleeping: Yes.   Patient alert and oriented: no Behavior appropriate: Yes.  ; If no, describe:   Nutrition and fluids offered: No Toileting and hygiene offered: No Sitter present: no Law enforcement present: Yes  and ODS 

## 2014-08-09 NOTE — ED Notes (Signed)
BEHAVIORAL HEALTH ROUNDING Patient sleeping: Yes.   Patient alert and oriented: not applicable Behavior appropriate: Yes.  ; If no, describe:  Nutrition and fluids offered: No Toileting and hygiene offered: No Sitter present: not applicable Law enforcement present: Yes  

## 2014-08-09 NOTE — ED Notes (Signed)
BEHAVIORAL HEALTH ROUNDING  Patient sleeping: No.  Patient alert and oriented: yes  Behavior appropriate: Yes. ; If no, describe:  Nutrition and fluids offered: Yes  Toileting and hygiene offered: Yes  Sitter present: not applicable  Law enforcement present: Yes ODS  

## 2014-08-09 NOTE — ED Notes (Signed)
BEHAVIORAL HEALTH ROUNDING Patient sleeping: No. Patient alert and oriented: yes Behavior appropriate: Yes.  ; If no, describe: MR affect Nutrition and fluids offered: Yes  Toileting and hygiene offered: Yes  Sitter present: no Law enforcement present: Yes  and ODS

## 2014-08-09 NOTE — ED Notes (Signed)
Handoff from [place], [place]  ENVIRONMENTAL ASSESSMENT Potentially harmful objects out of patient reach: Yes.   Personal belongings secured: Yes.   Patient dressed in hospital provided attire only: Yes.   Plastic bags out of patient reach: Yes.   Patient care equipment (cords, cables, call bells, lines, and drains) shortened, removed, or accounted for: Yes.   Equipment and supplies removed from bottom of stretcher: Yes.   Potentially toxic materials out of patient reach: Yes.   Sharps container removed or out of patient reach: Yes.

## 2014-08-09 NOTE — ED Notes (Signed)
Pt on phone 

## 2014-08-09 NOTE — ED Notes (Signed)
Lunch tray given. 

## 2014-08-09 NOTE — ED Provider Notes (Signed)
-----------------------------------------   11:14 PM on 08/09/2014 -----------------------------------------   BP 117/60 mmHg  Pulse 72  Temp(Src) 97.8 F (36.6 C) (Oral)  Resp 20  Ht 5\' 8"  (1.727 m)  Wt 150 lb (68.04 kg)  BMI 22.81 kg/m2  SpO2 100%  The patient had no acute events since last update.  Calm and cooperative at this time.  Disposition is pending per Psychiatry/Behavioral Medicine team recommendations.     Rebecka ApleyAllison P Webster, MD 08/10/14 [phone]

## 2014-08-09 NOTE — ED Notes (Signed)
Pt given breakfast tray

## 2014-08-09 NOTE — ED Notes (Signed)
BEHAVIORAL HEALTH ROUNDING Patient sleeping: No. Patient alert and oriented: yes Behavior appropriate: Yes.  ; If no, describe:  Nutrition and fluids offered: Yes  Toileting and hygiene offered: Yes  Sitter present: not applicable Law enforcement present: Yes  

## 2014-08-09 NOTE — ED Notes (Signed)
BEHAVIORAL HEALTH ROUNDING Patient sleeping: Yes.   Patient alert and oriented: not applicable Behavior appropriate: Yes.  ; If no, describe:  Nutrition and fluids offered: Yes  Toileting and hygiene offered: Yes  Sitter present: not applicable Law enforcement present: Yes  

## 2014-08-09 NOTE — ED Notes (Signed)
BEHAVIORAL HEALTH ROUNDING Patient sleeping: No. Patient alert and oriented: yes Behavior appropriate: Yes.  ; If no, describe:   Nutrition and fluids offered: Yes  Toileting and hygiene offered: Yes  Sitter present: no Law enforcement present: Yes  and ODS  ED BHU PLACEMENT JUSTIFICATION Is the patient under IVC or is there intent for IVC: Yes.   Is the patient medically cleared: Yes.   Is there vacancy in the ED BHU: Yes.   Is the population mix appropriate for patient: Yes.   Is the patient awaiting placement in inpatient or outpatient setting: Yes.   Has the patient had a psychiatric consult: Yes.   Survey of unit performed for contraband, proper placement and condition of furniture, tampering with fixtures in bathroom, shower, and each patient room: Yes.  ; Findings:  All clear APPEARANCE/BEHAVIOR calm, cooperative and adequate rapport can be established NEURO ASSESSMENT Orientation: place and person Hallucinations: No.None noted (Hallucinations) Speech: Normal Gait: normal RESPIRATORY ASSESSMENT Normal expansion.  Clear to auscultation.  No rales, rhonchi, or wheezing. CARDIOVASCULAR ASSESSMENT regular GASTROINTESTINAL ASSESSMENT no distress EXTREMITIES normal strength, tone, and muscle mass, no deformities PLAN OF CARE Provide calm/safe environment. Vital signs assessed twice daily. ED BHU Assessment once each 12-hour shift. Collaborate with intake RN daily or as condition indicates. Assure the ED provider has rounded once each shift. Provide and encourage hygiene. Provide redirection as needed. Assess for escalating behavior; address immediately and inform ED provider.  Assess family dynamic and appropriateness for visitation as needed: No.; If necessary, describe findings:   Educate the patient/family about BHU procedures/visitation: Yes.  ; If necessary, describe findings:

## 2014-08-09 NOTE — ED Notes (Signed)
Floor swept and mopped.

## 2014-08-09 NOTE — ED Notes (Signed)
Supper tray given with Coke and milk

## 2014-08-09 NOTE — ED Notes (Signed)
ED BHU PLACEMENT JUSTIFICATION Is the patient under IVC or is there intent for IVC: Yes.   Is the patient medically cleared: Yes.   Is there vacancy in the ED BHU: Yes.   Is the population mix appropriate for patient: Yes.   Is the patient awaiting placement in inpatient or outpatient setting: Yes.   Has the patient had a psychiatric consult: Yes.   Survey of unit performed for contraband, proper placement and condition of furniture, tampering with fixtures in bathroom, shower, and each patient room: Yes.  ; Findings:  APPEARANCE/BEHAVIOR adequate rapport can be established, calm, cooperative NEURO ASSESSMENT Orientation: time, person, place Hallucinations: No.None noted (Hallucinations) Speech: Normal Gait: normal RESPIRATORY ASSESSMENT Normal expansion.  Clear to auscultation.  No rales, rhonchi, or wheezing. CARDIOVASCULAR ASSESSMENT regular rate and rhythm, S1, S2 normal, no murmur, click, rub or gallop GASTROINTESTINAL ASSESSMENT soft, nontender, BS WNL, no r/g EXTREMITIES normal strength, tone, and muscle mass PLAN OF CARE Provide calm/safe environment. Vital signs assessed twice daily. ED BHU Assessment once each 12-hour shift. Collaborate with intake RN daily or as condition indicates. Assure the ED provider has rounded once each shift. Provide and encourage hygiene. Provide redirection as needed. Assess for escalating behavior; address immediately and inform ED provider.  Assess family dynamic and appropriateness for visitation as needed: Yes.  ; If necessary, describe findings:  Educate the patient/family about BHU procedures/visitation: Yes.  ; If necessary, describe findings:

## 2014-08-09 NOTE — ED Provider Notes (Signed)
-----------------------------------------   7:13 AM on 08/09/2014 -----------------------------------------   BP 104/51 mmHg  Pulse 83  Temp(Src) 98 F (36.7 C) (Oral)  Resp 20  Ht 5\' 8"  (1.727 m)  Wt 150 lb (68.04 kg)  BMI 22.81 kg/m2  SpO2 99%  No acute events since last update.  Acting appropriately.  Disposition is pending per Psychiatry/Behavioral Medicine team recommendations.     Phineas SemenGraydon Kirke Breach, MD 08/09/14 [phone]

## 2014-08-09 NOTE — ED Notes (Signed)
Pt out of shower. All items returned to tech. Pt back in room

## 2014-08-09 NOTE — Progress Notes (Signed)
Call to [facility] to follow up on patient's referral, spoke with Brendan Gamble, informed CSW to call Brendan Gamble [phone] on Monday to follow up referral submitted on patient.  Weekend CSW Left message for weekday CSW to follow up on Monday with ACT team referral   Brendan Gamble. Brendan Gamble, Brendan Gamble Clinical Social Work Department Emergency Room [phone] 12:09 PM

## 2014-08-09 NOTE — ED Notes (Signed)

## 2014-08-09 NOTE — ED Provider Notes (Signed)
-----------------------------------------   2:59 AM on 08/09/2014 -----------------------------------------   BP 104/51 mmHg  Pulse 83  Temp(Src) 98 F (36.7 C) (Oral)  Resp 20  Ht 5\' 8"  (1.727 m)  Wt 150 lb (68.04 kg)  BMI 22.81 kg/m2  SpO2 99%  The patient had no acute events since last update.   Resting comfortably.  Disposition is pending per Psychiatry/Behavioral Medicine team recommendations.     Gayla DossEryka A Szymon Foiles, MD 08/09/14 [phone]

## 2014-08-09 NOTE — ED Notes (Signed)
Pharmacy called for meds.  

## 2014-08-10 MED ORDER — SERTRALINE HCL 100 MG PO TABS
ORAL_TABLET | ORAL | Status: AC
  Filled 2014-08-10: qty 1

## 2014-08-10 MED ORDER — SERTRALINE HCL 100 MG PO TABS
ORAL_TABLET | ORAL | Status: AC
  Administered 2014-08-10: 100 mg via ORAL
  Filled 2014-08-10: qty 1

## 2014-08-10 NOTE — ED Notes (Signed)

## 2014-08-10 NOTE — ED Notes (Signed)
BEHAVIORAL HEALTH ROUNDING Patient sleeping: No. Patient alert and oriented: yes Behavior appropriate: Yes.  ; If no, describe:  Nutrition and fluids offered: Yes  Toileting and hygiene offered: Yes  Sitter present: no Law enforcement present: Yes ODS 

## 2014-08-10 NOTE — ED Notes (Signed)
BEHAVIORAL HEALTH ROUNDING Patient sleeping: Yes.   Patient alert and oriented: asleep Behavior appropriate: Yes.  ; If no, describe:  Nutrition and fluids offered: asleep Toileting and hygiene offered: asleep Sitter present: no Law enforcement present: Yes, ODS 

## 2014-08-10 NOTE — ED Provider Notes (Signed)
-----------------------------------------   6:54 AM on 08/10/2014 -----------------------------------------   BP 117/60 mmHg  Pulse 72  Temp(Src) 97.8 F (36.6 C) (Oral)  Resp 20  Ht 5\' 8"  (1.727 m)  Wt 150 lb (68.04 kg)  BMI 22.81 kg/m2  SpO2 100%  The patient had no acute events since last update.  Calm and cooperative at this time.  Social worker will follow-up several possible disposition plans today.    Loleta Roseory Dawaun Brancato, MD 08/10/14 [phone]

## 2014-08-10 NOTE — ED Notes (Signed)
Pt provided with sandwich tray and drink.  

## 2014-08-10 NOTE — ED Notes (Signed)
ED BHU PLACEMENT JUSTIFICATION Is the patient under IVC or is there intent for IVC: Yes.   Is the patient medically cleared: Yes.   Is there vacancy in the ED BHU: Yes.   Is the population mix appropriate for patient: Yes.   Is the patient awaiting placement in inpatient or outpatient setting: Yes.   Has the patient had a psychiatric consult: Yes.   Survey of unit performed for contraband, proper placement and condition of furniture, tampering with fixtures in bathroom, shower, and each patient room: Yes.  ; Findings: all clear APPEARANCE/BEHAVIOR calm, cooperative and adequate rapport can be established NEURO ASSESSMENT Orientation: time, place and person Hallucinations: No.None noted (Hallucinations) Speech: Normal Gait: normal RESPIRATORY ASSESSMENT WNL CARDIOVASCULAR ASSESSMENT WNL GASTROINTESTINAL ASSESSMENT WNL EXTREMITIES ROM of all joints is normal PLAN OF CARE Provide calm/safe environment. Vital signs assessed twice daily. ED BHU Assessment once each 12-hour shift. Collaborate with intake RN daily or as condition indicates. Assure the ED provider has rounded once each shift. Provide and encourage hygiene. Provide redirection as needed. Assess for escalating behavior; address immediately and inform ED provider.  Assess family dynamic and appropriateness for visitation as needed: Yes.  ; If necessary, describe findings:  Educate the patient/family about BHU procedures/visitation: Yes.  ; If necessary, describe findings:  

## 2014-08-10 NOTE — ED Notes (Signed)
Pt resting in bed with eyes closed. No unusual behavior observed. Pt has no needs or concerns at this time. Will continue to monitor and f/u as needed.  

## 2014-08-10 NOTE — ED Notes (Signed)
Pt report received from Dolphus JennyStephanie R RN. Pt care assumed. Pt in day room talking with other pts. Pt has no needs or concerns at this time.

## 2014-08-10 NOTE — ED Notes (Signed)
Pt resting in bed watching tv. No unusual behavior observed. Pt has no needs or concerns at this time. Will continue to monitor and f/u as needed.  

## 2014-08-10 NOTE — ED Notes (Signed)
BEHAVIORAL HEALTH ROUNDING Patient sleeping: Yes.   Patient alert and oriented: not applicable Behavior appropriate: Yes.  ; If no, describe: SLEEPING Nutrition and fluids offered: No Toileting and hygiene offered: No Sitter present: not applicable Law enforcement present: Yes ODS 

## 2014-08-10 NOTE — ED Notes (Signed)
BEHAVIORAL HEALTH ROUNDING Patient sleeping: Yes.   Patient alert and oriented: not applicable Behavior appropriate: Yes.  ; If no, describe: sleeping Nutrition and fluids offered: No Toileting and hygiene offered: No Sitter present: not applicable Law enforcement present: Yes ods 

## 2014-08-10 NOTE — ED Notes (Signed)
PT. IVC/  PENDING  PLACEMENT  PT ON  3RD  SET OF  IVC  PAPERS TAKEN  OUT  ON 08/10/14

## 2014-08-10 NOTE — ED Notes (Addendum)
BEHAVIORAL HEALTH ROUNDING Patient sleeping: No. Patient alert and oriented: yes Behavior appropriate: Yes.  ; If no, describe:  Nutrition and fluids offered: Yes  Toileting and hygiene offered: Yes  Sitter present: no Law enforcement present: Yes ODS 

## 2014-08-10 NOTE — ED Notes (Signed)
ED BHU PLACEMENT JUSTIFICATION Is the patient under IVC or is there intent for IVC: Yes.   Is the patient medically cleared: Yes.   Is there vacancy in the ED BHU: Yes.   Is the population mix appropriate for patient: Yes.   Is the patient awaiting placement in inpatient or outpatient setting: Yes.   Has the patient had a psychiatric consult: Yes.   Survey of unit performed for contraband, proper placement and condition of furniture, tampering with fixtures in bathroom, shower, and each patient room: Yes.  ; Findings:  APPEARANCE/BEHAVIOR cooperative NEURO ASSESSMENT Orientation: AAO x 3 Hallucinations: No.None noted (Hallucinations) Speech: Normal Gait: normal RESPIRATORY ASSESSMENT Lung sounds clear bilat  CARDIOVASCULAR ASSESSMENT Heart tones audible, skin warm and dry, skin color wnl  GASTROINTESTINAL ASSESSMENT wnl EXTREMITIES Moves all extremities  PLAN OF CARE Provide calm/safe environment. Vital signs assessed twice daily. ED BHU Assessment once each 12-hour shift. Collaborate with intake RN daily or as condition indicates. Assure the ED provider has rounded once each shift. Provide and encourage hygiene. Provide redirection as needed. Assess for escalating behavior; address immediately and inform ED provider.  Assess family dynamic and appropriateness for visitation as needed: Yes.  ; If necessary, describe findings:  Educate the patient/family about BHU procedures/visitation: Yes.  ; If necessary, describe findings:

## 2014-08-10 NOTE — ED Notes (Signed)
BEHAVIORAL HEALTH ROUNDING  Patient sleeping: No.  Patient alert and oriented: yes  Behavior appropriate: Yes. ; If no, describe:  Nutrition and fluids offered: Yes  Toileting and hygiene offered: Yes  Sitter present: not applicable  Law enforcement present: Yes ODS  

## 2014-08-10 NOTE — Consult Note (Signed)
  Psychiatry: Follow-up 24 year old man with autistic spectrum disorder and developmental disability. Patient has no new complaints today.  On review of systems no physical complaints. No hallucinations no suicidality.  On mental status he is unchanged. Easily arousable. Oriented to his basic situation. Affect blunted mood is okay. Behavior fairly calm and cooperative. Does not appear to be psychotic. Cognitively still very impaired.  No change to anything about his current presentation. No new seizures. Patient continues to reside in the emergency room as weight weight on the possibility of placement at some point. Supportive counseling completed.

## 2014-08-10 NOTE — ED Notes (Signed)
BEHAVIORAL HEALTH ROUNDING Patient sleeping: No. Patient alert and oriented: yes Behavior appropriate: Yes.  ; If no, describe:  Nutrition and fluids offered: Yes  Toileting and hygiene offered: Yes  Sitter present: no Law enforcement present: Yes, ODS 

## 2014-08-11 MED ORDER — CARBAMAZEPINE 200 MG PO TABS
ORAL_TABLET | ORAL | Status: AC
  Administered 2014-08-11: 800 mg
  Filled 2014-08-11: qty 4

## 2014-08-11 MED ORDER — SERTRALINE HCL 100 MG PO TABS
ORAL_TABLET | ORAL | Status: AC
  Administered 2014-08-11: 100 mg via ORAL
  Filled 2014-08-11: qty 1

## 2014-08-11 MED ORDER — LORAZEPAM 0.5 MG PO TABS
ORAL_TABLET | ORAL | Status: AC
  Administered 2014-08-11: 0.5 mg via ORAL
  Filled 2014-08-11: qty 1

## 2014-08-11 MED ORDER — LEVETIRACETAM 500 MG PO TABS
ORAL_TABLET | ORAL | Status: AC
  Administered 2014-08-11: 750 mg via ORAL
  Filled 2014-08-11: qty 2

## 2014-08-11 NOTE — ED Notes (Signed)
Pt. Given lunch tray. In room at this time. No acute distress noted.   

## 2014-08-11 NOTE — ED Notes (Signed)
BEHAVIORAL HEALTH ROUNDING Patient sleeping: No. Patient alert and oriented: yes Behavior appropriate: Yes.  ; If no, describe:  Nutrition and fluids offered: Yes  Toileting and hygiene offered: Yes  Sitter present: no Law enforcement present: Yes, ODS 

## 2014-08-11 NOTE — ED Notes (Signed)
BEHAVIORAL HEALTH ROUNDING Patient sleeping: Yes.   Patient alert and oriented: asleep Behavior appropriate: Yes.  ; If no, describe:  Nutrition and fluids offered: asleep Toileting and hygiene offered: asleep Sitter present: no Law enforcement present: Yes, ODS 

## 2014-08-11 NOTE — ED Notes (Signed)
BEHAVIORAL HEALTH ROUNDING Patient sleeping: No. Patient alert and oriented: yes Behavior appropriate: Yes.   Nutrition and fluids offered: Yes  Toileting and hygiene offered: Yes  Sitter present: yes 15 min checks  Law enforcement present: Yes  

## 2014-08-11 NOTE — ED Notes (Signed)

## 2014-08-11 NOTE — ED Notes (Signed)
ED BHU PLACEMENT JUSTIFICATION Is the patient under IVC or is there intent for IVC: Yes.   Is the patient medically cleared: Yes.   Is there vacancy in the ED BHU: Yes.   Is the population mix appropriate for patient: Yes.   Is the patient awaiting placement in inpatient or outpatient setting: Yes.   Has the patient had a psychiatric consult: Yes.   Survey of unit performed for contraband, proper placement and condition of furniture, tampering with fixtures in bathroom, shower, and each patient room: Yes.   APPEARANCE/BEHAVIOR calm and cooperative NEURO ASSESSMENT Orientation: time, place and person Hallucinations: No.None noted (Hallucinations) Speech: Normal Gait: normal RESPIRATORY ASSESSMENT Breathing Pattern: Regular, Unlabored  CARDIOVASCULAR ASSESSMENT NO acute distress noted.  GASTROINTESTINAL ASSESSMENT No distention.  NO acute distress noted.  EXTREMITIES normal strength, tone, and muscle mass PLAN OF CARE Provide calm/safe environment. Vital signs assessed twice daily. ED BHU Assessment once each 12-hour shift. Collaborate with intake RN daily or as condition indicates. Assure the ED provider has rounded once each shift. Provide and encourage hygiene. Provide redirection as needed. Assess for escalating behavior; address immediately and inform ED provider.  Assess family dynamic and appropriateness for visitation as needed: Yes.   Educate the patient/family about BHU procedures/visitation: Yes.

## 2014-08-11 NOTE — ED Notes (Signed)
Pt resting in bed with eyes closed. No unusual behavior observed. Pt has no needs or concerns at this time. Will continue to monitor and f/u as needed.  

## 2014-08-11 NOTE — ED Provider Notes (Signed)
-----------------------------------------   7:03 AM on 08/11/2014 -----------------------------------------   BP 114/62 mmHg  Pulse 72  Temp(Src) 98.1 F (36.7 C) (Oral)  Resp 18  Ht 5\' 8"  (1.727 m)  Wt 150 lb (68.04 kg)  BMI 22.81 kg/m2  SpO2 99%  The patient had no acute events since last update.  Calm and cooperative at this time.  Disposition is pending per Psychiatry/Behavioral Medicine team recommendations.     Darci Currentandolph N Brown, MD 08/11/14 [phone]

## 2014-08-11 NOTE — ED Notes (Signed)
ED BHU PLACEMENT JUSTIFICATION Is the patient under IVC or is there intent for IVC: Yes.   Is the patient medically cleared: Yes.   Is there vacancy in the ED BHU: Yes.   Is the population mix appropriate for patient: Yes.   Is the patient awaiting placement in inpatient or outpatient setting: Yes.   Has the patient had a psychiatric consult: Yes.   Survey of unit performed for contraband, proper placement and condition of furniture, tampering with fixtures in bathroom, shower, and each patient room: Yes.   APPEARANCE/BEHAVIOR calm and cooperative NEURO ASSESSMENT Orientation: time, place and person Hallucinations: No.None noted (Hallucinations) Speech: Normal Gait: normal RESPIRATORY ASSESSMENT Breathing Pattern: Regular, Unlabored.  CARDIOVASCULAR ASSESSMENT No acute distress noted.  GASTROINTESTINAL ASSESSMENT No acute distress noted. Normal Apperance at this time.  EXTREMITIES normal strength, tone, and muscle mass, no evidence of joint instability PLAN OF CARE Provide calm/safe environment. Vital signs assessed twice daily. ED BHU Assessment once each 12-hour shift. Collaborate with intake RN daily or as condition indicates. Assure the ED provider has rounded once each shift. Provide and encourage hygiene. Provide redirection as needed. Assess for escalating behavior; address immediately and inform ED provider.  Assess family dynamic and appropriateness for visitation as needed: Yes.   Educate the patient/family about BHU procedures/visitation: Yes.

## 2014-08-11 NOTE — Consult Note (Signed)
  Psychiatry: Follow-up for this patient in the emergency room. History of autism and mental retardation. No new complaints today. No new behavior problems. No physical problems. Vital signs stable. Patient is awake and alert and interactive. Mental status unchanged from previously.  No change to diagnosis no change to medication or treatment plan. Team is continuing to work on placement

## 2014-08-11 NOTE — ED Notes (Signed)
MD at bedside. 

## 2014-08-11 NOTE — ED Notes (Signed)
Pt provided with sandwich tray and drink.  

## 2014-08-11 NOTE — ED Notes (Signed)
Pt report received from Sheria LangJean H RN. Pt care assumed. Pt sitting in day room talking with other pts and watching tv.

## 2014-08-11 NOTE — ED Notes (Signed)
Pt. Given supper tray. In room at this time. No acute distress noted.    

## 2014-08-11 NOTE — ED Notes (Signed)
Pt requesting sprite to drink. Pt advised this was his soda for the night and he would be getting water with his snack. Pt verbalized understanding and provided with sprite.

## 2014-08-11 NOTE — ED Notes (Signed)
BEHAVIORAL HEALTH ROUNDING Patient sleeping: Yes.   Patient alert and oriented: not applicable Behavior appropriate: Yes.   Nutrition and fluids offered: Yes  and Food tray placed at bedside  Toileting and hygiene offered: Yes  Sitter present: yes; 15 min checks  Law enforcement present: Yes

## 2014-08-11 NOTE — ED Notes (Signed)
Pt. Resting in chair in day room at this time.

## 2014-08-11 NOTE — ED Provider Notes (Signed)
-----------------------------------------   7:42 AM on 08/11/2014 -----------------------------------------   BP 114/62 mmHg  Pulse 72  Temp(Src) 98.1 F (36.7 C) (Oral)  Resp 18  Ht 5\' 8"  (1.727 m)  Wt 150 lb (68.04 kg)  BMI 22.81 kg/m2  SpO2 99%  The patient had no acute events since last update.  Calm and cooperative at this time.  Disposition is pending per Psychiatry/Behavioral Medicine team recommendations. Patient is under involuntary commitment and is cooperative in the behavioral medicine unit.     Sherlyn HaySheryl L Dafina Suk, DO 08/11/14 [phone]

## 2014-08-11 NOTE — ED Notes (Signed)
Pt.on 3rd set of IVC of papers still meeting criteria for admission to a inpatient facility Intake staff waiting on acceptance

## 2014-08-11 NOTE — ED Notes (Signed)
ED BHU PLACEMENT JUSTIFICATION Is the patient under IVC or is there intent for IVC: Yes.   Is the patient medically cleared: Yes.   Is there vacancy in the ED BHU: Yes.   Is the population mix appropriate for patient: Yes.   Is the patient awaiting placement in inpatient or outpatient setting: Yes.   Has the patient had a psychiatric consult: Yes.   Survey of unit performed for contraband, proper placement and condition of furniture, tampering with fixtures in bathroom, shower, and each patient room: Yes.  ; Findings:  APPEARANCE/BEHAVIOR cooperative NEURO ASSESSMENT Orientation: AAO x 3 Hallucinations: No.None noted (Hallucinations) Speech: Normal Gait: normal RESPIRATORY ASSESSMENT Even and unlabored  CARDIOVASCULAR ASSESSMENT Heart rate wnl, skin warm and dry, skin color wnl GASTROINTESTINAL ASSESSMENT wnl EXTREMITIES Moves all extremities  PLAN OF CARE Provide calm/safe environment. Vital signs assessed twice daily. ED BHU Assessment once each 12-hour shift. Collaborate with intake RN daily or as condition indicates. Assure the ED provider has rounded once each shift. Provide and encourage hygiene. Provide redirection as needed. Assess for escalating behavior; address immediately and inform ED provider.  Assess family dynamic and appropriateness for visitation as needed: Yes.  ; If necessary, describe findings:  Educate the patient/family about BHU procedures/visitation: Yes.  ; If necessary, describe findings:

## 2014-08-12 MED ORDER — SERTRALINE HCL 100 MG PO TABS
ORAL_TABLET | ORAL | Status: AC
  Filled 2014-08-12: qty 1

## 2014-08-12 NOTE — ED Notes (Signed)
meds administered as ordered    Pt denies pain

## 2014-08-12 NOTE — ED Notes (Signed)
BEHAVIORAL HEALTH ROUNDING Patient sleeping: Yes.   Patient alert and oriented: asleep Behavior appropriate: Yes.  ; If no, describe:  Nutrition and fluids offered: asleep Toileting and hygiene offered: asleep Sitter present: no Law enforcement present: Yes, ODS 

## 2014-08-12 NOTE — ED Notes (Signed)
Pt observed with no unusual behavior  Appropriate to stimulation  No verbalized needs or concerns at this time  NAD assessed  Continue to monitor 

## 2014-08-12 NOTE — ED Notes (Signed)
BEHAVIORAL HEALTH ROUNDING Patient sleeping: No. Patient alert and oriented: yes Behavior appropriate: Yes.  ; If no, describe:  Nutrition and fluids offered: yes Toileting and hygiene offered: Yes  Sitter present: q15 minute observations and security camera monitoring Law enforcement present: Yes  ODS  

## 2014-08-12 NOTE — ED Notes (Signed)
Breakfast provided  Pt observed ambulating to and from the BR  NAD observed

## 2014-08-12 NOTE — ED Notes (Signed)

## 2014-08-12 NOTE — ED Notes (Signed)
BEHAVIORAL HEALTH ROUNDING Patient sleeping: No. Patient alert and oriented: yes Behavior appropriate: Yes.   Nutrition and fluids offered: Yes  Toileting and hygiene offered: Yes  Sitter present: q15 min observations and security camera monitoring Law enforcement present: Yes [employer] 

## 2014-08-12 NOTE — ED Notes (Signed)
BEHAVIORAL HEALTH ROUNDING Patient sleeping: Yes.   Patient alert and oriented: asleep Behavior appropriate: Yes.  ; If no, describe:  Nutrition and fluids offered:no Toileting and hygiene offered: asleep Sitter present: no Law enforcement present: Yes, [facility]

## 2014-08-12 NOTE — ED Notes (Signed)
Pt to the door talking with me about "what a midget is"  He is talking aboout a movie where there is a giant and midgets  - jack and the Beanstalk?  Pt talkative this afternoon  - requesting that [facility] Artis come out and talk with him

## 2014-08-12 NOTE — ED Notes (Signed)
Lunch provided  Pt observed with no unusual behavior  Appropriate to stimulation  No verbalized needs or concerns at this time  NAD assessed  Continue to monitor 

## 2014-08-12 NOTE — ED Notes (Signed)
Pt is sitting in a recliner in the dayroom - watching [place] the train on TV  NAD observed

## 2014-08-12 NOTE — ED Notes (Signed)
Patient observed lying in bed with eyes closed  Even, unlabored respirations observed   NAD pt appears to be sleeping  I will continue to monitor along with every 15 minute visual observations and ongoing security camera monitoring    

## 2014-08-12 NOTE — Consult Note (Signed)
  Psychiatry: Follow-up for this 24 year old man with autism and mental retardation. No new complaints. Behavior has been calm. Patient continues to ask when he can be placed in a group home. We have no new answers for him. Tolerating medicine. No sign of seizures. Does not complain of suicidal ideation does not appear to be responding to internal stimuli. Continue current medicine. Placement is still pending

## 2014-08-12 NOTE — ED Notes (Signed)
IVC/Pending placement 

## 2014-08-12 NOTE — ED Notes (Signed)
ED BHU PLACEMENT JUSTIFICATION Is the patient under IVC or is there intent for IVC: Yes.    Is IVC current? Yes Is the patient medically cleared: Yes.   Is there vacancy in the ED BHU: Yes.   Is the population mix appropriate for patient: Yes.   Is the patient awaiting placement in inpatient or outpatient setting: Yes.   Has the patient had a psychiatric consult: Yes.   Survey of unit performed for contraband, proper placement and condition of furniture, tampering with fixtures in bathroom, shower, and each patient room: Yes.  ; Findings: none APPEARANCE/BEHAVIOR Cooperative,calm NEURO ASSESSMENT Orientation: person Hallucinations: Yes.  Auditory Hallucinations Speech: Normal Gait: normal RESPIRATORY ASSESSMENT Breathing Pattern-regular, no respiratory distress noted CARDIOVASCULAR ASSESSMENT Skin color appropriate for age and race GASTROINTESTINAL ASSESSMENT no GI distress noted EXTREMITIES Moves all extremities, no distress noted PLAN OF CARE Provide calm/safe environment. Vital signs assessed twice daily. ED BHU Assessment once each 12-hour shift. Collaborate with intake RN daily or as condition indicates. Assure the ED provider has rounded once each shift. Provide and encourage hygiene. Provide redirection as needed. Assess for escalating behavior; address immediately and inform ED provider.  Assess family dynamic and appropriateness for visitation as needed: Yes.  ; If necessary, describe findings:  Educate the patient/family about BHU procedures/visitation: Yes.  ; If necessary, describe findings:  

## 2014-08-12 NOTE — ED Notes (Signed)
Pt resting in bed with eyes closed. No unusual behavior observed. Pt has no needs or concerns at this time. Will continue to monitor and f/u as needed.  

## 2014-08-12 NOTE — ED Notes (Signed)
Pt is currently taking a shower 

## 2014-08-12 NOTE — ED Notes (Addendum)
Pt resting in bed with eyes closed. No unusual behavior observed. Pt has no needs or concerns at this time. Will continue to monitor and f/u as needed.  

## 2014-08-12 NOTE — ED Provider Notes (Signed)
-----------------------------------------   7:39 AM on 08/12/2014 -----------------------------------------   BP 115/55 mmHg  Pulse 85  Temp(Src) 98.4 F (36.9 C) (Oral)  Resp 18  Ht 5\' 8"  (1.727 m)  Wt 150 lb (68.04 kg)  BMI 22.81 kg/m2  SpO2 98%  The patient had no acute events since last update.  Calm and cooperative at this time.  Disposition is pending per Psychiatry/Behavioral Medicine team recommendations.     Irean HongJade J Sung, MD 08/12/14 [phone]

## 2014-08-12 NOTE — ED Notes (Signed)
Pt is sitting in the dayroom in a recliner watching TV  NAD assessed  No verbalized needs or concerns at this time

## 2014-08-12 NOTE — ED Notes (Signed)
Supper provided  Pt observed with no unusual behavior  Appropriate to stimulation  No verbalized needs or concerns at this time  NAD assessed  Continue to monitor 

## 2014-08-13 MED ORDER — LEVETIRACETAM 500 MG PO TABS
ORAL_TABLET | ORAL | Status: AC
  Filled 2014-08-13: qty 2

## 2014-08-13 MED ORDER — SERTRALINE HCL 100 MG PO TABS
ORAL_TABLET | ORAL | Status: AC
  Administered 2014-08-13: 100 mg
  Filled 2014-08-13: qty 1

## 2014-08-13 MED ORDER — CARBAMAZEPINE 200 MG PO TABS
ORAL_TABLET | ORAL | Status: AC
  Administered 2014-08-13: 800 mg
  Filled 2014-08-13: qty 4

## 2014-08-13 MED ORDER — SERTRALINE HCL 100 MG PO TABS
ORAL_TABLET | ORAL | Status: AC
  Administered 2014-08-13: 100 mg via ORAL
  Filled 2014-08-13: qty 1

## 2014-08-13 MED ORDER — CARBAMAZEPINE 200 MG PO TABS
ORAL_TABLET | ORAL | Status: AC
  Filled 2014-08-13: qty 4

## 2014-08-13 NOTE — ED Notes (Signed)
BEHAVIORAL HEALTH ROUNDING Patient sleeping: Yes.   Patient alert and oriented: yes Behavior appropriate: Yes.  ; If no, describe: n/a Nutrition and fluids offered: Yes  Toileting and hygiene offered: Yes  Sitter present: yes Law enforcement present: Yes  

## 2014-08-13 NOTE — ED Notes (Signed)

## 2014-08-13 NOTE — ED Notes (Signed)
Pt. Noted sleeping in room. No complaints or concerns voiced. No distress or abnormal behavior noted. Will continue to monitor with security cameras. Q 15 minute rounds continue. 

## 2014-08-13 NOTE — ED Notes (Signed)
BEHAVIORAL HEALTH ROUNDING Patient sleeping: No. Patient alert and oriented: yes Behavior appropriate: Yes.  ; If no, describe: n/a Nutrition and fluids offered: Yes  Toileting and hygiene offered: Yes  Sitter present: yes Law enforcement present: Yes  

## 2014-08-13 NOTE — ED Notes (Signed)
BEHAVIORAL HEALTH ROUNDING Patient sleeping: No. Patient alert and oriented: yes Behavior appropriate: Yes.   Nutrition and fluids offered: Yes  Toileting and hygiene offered: Yes  Sitter present: q15 min observations and security camera monitoring Law enforcement present: Yes [employer] 

## 2014-08-13 NOTE — ED Provider Notes (Signed)
-----------------------------------------   9:50 PM on 08/13/2014 -----------------------------------------   BP 110/67 mmHg  Pulse 102  Temp(Src) 98.4 F (36.9 C) (Oral)  Resp 18  Ht 5\' 8"  (1.727 m)  Wt 150 lb (68.04 kg)  BMI 22.81 kg/m2  SpO2 98%  The patient had no acute events since last update.  Calm and cooperative at this time.  Disposition is pending per Psychiatry/Behavioral Medicine team recommendations.    Arnaldo NatalPaul F Jayleen Scaglione, MD 08/13/14 2150

## 2014-08-13 NOTE — ED Notes (Signed)
BEHAVIORAL HEALTH ROUNDING Patient sleeping: Yes.   Patient alert and oriented: not applicable Behavior appropriate: Yes.    Nutrition and fluids offered: No Toileting and hygiene offered: No Sitter present: q15 minute observations and security camera monitoring Law enforcement present: Yes [employer] 

## 2014-08-13 NOTE — ED Notes (Signed)
[facility]. Pt. Alert and in no distress denies SI, HI, AVH and pain.  Pt. Instructed to come to me with problems or concerns.Will continue to monitor for safety via security cameras and Q 15 minute checks.

## 2014-08-13 NOTE — Consult Note (Signed)
  Psychiatry: Follow-up for this 24 year old man with autistic spectrum disorder and mental retardation. No new complaints today.  Review of systems is all negative.  On mental status he is the same as usual. Calm. Not violent or threatening. Stays in bed much of the time. Does get up and watch TV from time to time. In conversation is extremely limited in simple in his thinking. Repeats his desire to be discharged to her group home. No evidence of side effects to medicine.  Vital signs normal. Patient's health appears stable. No sign of seizures. Continue current medication and monitoring while we work on discharge planning

## 2014-08-13 NOTE — ED Notes (Signed)
Pt. Noted in day room. No complaints or concerns voiced. No distress or abnormal behavior noted. Will continue to monitor with security cameras. Q 15 minute rounds continue. Sandwich and soft drink given.

## 2014-08-13 NOTE — ED Provider Notes (Signed)
-----------------------------------------   6:09 AM on 08/13/2014 -----------------------------------------   BP 93/46 mmHg  Pulse 79  Temp(Src) 98.4 F (36.9 C) (Oral)  Resp 18  Ht 5\' 8"  (1.727 m)  Wt 150 lb (68.04 kg)  BMI 22.81 kg/m2  SpO2 99%  The patient had no acute events since last update.  Calm and cooperative at this time.  Disposition is pending per Psychiatry/Behavioral Medicine team recommendations.     Irean HongJade J Aleighna Wojtas, MD 08/13/14 [phone]

## 2014-08-13 NOTE — ED Notes (Signed)
Pt. Noted in room. No complaints or concerns voiced. No distress or abnormal behavior noted. Will continue to monitor with security cameras. Q 15 minute rounds continue. 

## 2014-08-14 MED ORDER — SERTRALINE HCL 100 MG PO TABS
ORAL_TABLET | ORAL | Status: AC
  Filled 2014-08-14: qty 1

## 2014-08-14 MED ORDER — SERTRALINE HCL 100 MG PO TABS
ORAL_TABLET | ORAL | Status: AC
  Administered 2014-08-14: 100 mg via ORAL
  Filled 2014-08-14: qty 1

## 2014-08-14 MED ORDER — CARBAMAZEPINE 200 MG PO TABS
ORAL_TABLET | ORAL | Status: AC
  Filled 2014-08-14: qty 2

## 2014-08-14 NOTE — ED Notes (Signed)
BEHAVIORAL HEALTH ROUNDING Patient sleeping: Yes.   Patient alert and oriented: eyes closed  Even unlabored respirations observed Behavior appropriate: Yes.  ; If no, describe:  Nutrition and fluids offered: Yes  Toileting and hygiene offered: Yes  Sitter present: q15 minute observations and security camera monitoring  Law enforcement present: yes  [facility]

## 2014-08-14 NOTE — ED Notes (Signed)
Pt. Noted sleeping in room. No complaints or concerns voiced. No distress or abnormal behavior noted. Will continue to monitor with security cameras. Q 15 minute rounds continue. 

## 2014-08-14 NOTE — ED Notes (Signed)
BEHAVIORAL HEALTH ROUNDING Patient sleeping: No. Patient alert and oriented: yes Behavior appropriate: Yes.  ; If no, describe:  Nutrition and fluids offered: yes Toileting and hygiene offered: Yes  Sitter present: q15 minute observations and security camera monitoring Law enforcement present: Yes  ODS  

## 2014-08-14 NOTE — ED Notes (Signed)
BEHAVIORAL HEALTH ROUNDING Patient sleeping: No. Patient alert and oriented: yes Behavior appropriate: Yes.   Nutrition and fluids offered: Yes  Toileting and hygiene offered: Yes  Sitter present: q15 min observations and security camera monitoring Law enforcement present: Yes [employer] 

## 2014-08-14 NOTE — ED Notes (Signed)
Breakfast provided   Patient observed lying in bed with eyes closed  Even, unlabored respirations observed   NAD pt appears to be sleeping  I will continue to monitor along with every 15 minute visual observations and ongoing security camera monitoring    

## 2014-08-14 NOTE — ED Notes (Signed)

## 2014-08-14 NOTE — ED Notes (Signed)
Patient observed lying in bed with eyes closed  Even, unlabored respirations observed   NAD pt appears to be sleeping  I will continue to monitor along with every 15 minute visual observations and ongoing security camera monitoring    

## 2014-08-14 NOTE — ED Notes (Signed)
Pt IVC/ Pending Dispo

## 2014-08-14 NOTE — ED Notes (Signed)
Patient  In shower 

## 2014-08-14 NOTE — ED Notes (Signed)
IVC/Pending Placement 

## 2014-08-14 NOTE — ED Notes (Signed)
Pt is sitting in a recliner in the dayroom - watching Tv  Pt observed with no unusual behavior  Appropriate to stimulation  No verbalized needs or concerns at this time  NAD assessed  Continue to monitor

## 2014-08-14 NOTE — ED Notes (Signed)
Patient in bathroom

## 2014-08-14 NOTE — ED Notes (Signed)

## 2014-08-14 NOTE — ED Provider Notes (Signed)
-----------------------------------------   6:12 AM on 08/14/2014 -----------------------------------------   BP 110/67 mmHg  Pulse 102  Temp(Src) 98.4 F (36.9 C) (Oral)  Resp 18  Ht 5\' 8"  (1.727 m)  Wt 150 lb (68.04 kg)  BMI 22.81 kg/m2  SpO2 98%  The patient had no acute events since last update.  Calm and cooperative at this time.  Disposition is pending per Psychiatry/Behavioral Medicine team recommendations.     Irean HongJade J Cortina Vultaggio, MD 08/14/14 [phone]

## 2014-08-14 NOTE — ED Notes (Signed)
Meal was given to patient.. 

## 2014-08-14 NOTE — ED Notes (Signed)
Pt observed with no unusual behavior  Appropriate to stimulation  No verbalized needs or concerns at this time  NAD assessed  Continue to monitor 

## 2014-08-14 NOTE — ED Notes (Signed)
Pt. Noted in room. No complaints or concerns voiced. No distress noted. Will continue to monitor with security cameras. Q 15 minute rounds continue. 

## 2014-08-14 NOTE — ED Notes (Signed)
Lunch provided  Pt observed with no unusual behavior  Appropriate to stimulation  No verbalized needs or concerns at this time  NAD assessed  Continue to monitor 

## 2014-08-14 NOTE — ED Notes (Signed)
Pt is currently standing in his room talking on the telephone   Pt observed with no unusual behavior  Appropriate to stimulation  No verbalized needs or concerns at this time  NAD assessed  Continue to monitor

## 2014-08-14 NOTE — Progress Notes (Signed)
Patient continues to wait for placement.  [facility] Tammy Rivers [phone] for an update on patient's placement.  The team continues to aggressively look for placement that will meet patient's needs.   [facility] ([facility]) is still under consideration, they are waiting for the Utilization Review.  Tammie will call CSW back with an update the first of next week.  Also stated a [facility] has been made with [facility] to set up an ACT team for patient.  Unable to set up ACT team until patient has a permanent resident.  Care Coordinator will facilitate setting up ACT team once a new group home placement is finalized.  ED-CSW will continue to follow patient    Sammuel HinesDeborah Verdelle Valtierra. Theresia MajorsLCSWA, MSW Clinical Social Work Department Emergency Room [phone] 10:35 AM
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
## 2014-08-14 NOTE — ED Notes (Signed)
Supper provided  Pt observed with no unusual behavior  Appropriate to stimulation  No verbalized needs or concerns at this time  NAD assessed  Continue to monitor 

## 2014-08-14 NOTE — ED Notes (Signed)
ED BHU PLACEMENT JUSTIFICATION Is the patient under IVC or is there intent for IVC: Yes.    Is IVC current? Yes Is the patient medically cleared: Yes.   Is there vacancy in the ED BHU: Yes.   Is the population mix appropriate for patient: Yes.   Is the patient awaiting placement in inpatient or outpatient setting: Yes.  New group home placement Has the patient had a psychiatric consult: Yes.   Survey of unit performed for contraband, proper placement and condition of furniture, tampering with fixtures in bathroom, shower, and each patient room: Yes.  ; Findings: none APPEARANCE/BEHAVIOR Cooperative,calm NEURO ASSESSMENT Orientation: person, place Hallucinations: none noted at this time Speech: Normal Gait: normal RESPIRATORY ASSESSMENT Breathing Pattern-regular, no respiratory distress noted CARDIOVASCULAR ASSESSMENT Skin color appropriate for age and race GASTROINTESTINAL ASSESSMENT no GI distress noted EXTREMITIES Moves all extremities, no distress noted PLAN OF CARE Provide calm/safe environment. Vital signs assessed twice daily. ED BHU Assessment once each 12-hour shift. Collaborate with intake RN daily or as condition indicates. Assure the ED provider has rounded once each shift. Provide and encourage hygiene. Provide redirection as needed. Assess for escalating behavior; address immediately and inform ED provider.  Assess family dynamic and appropriateness for visitation as needed: Yes.   Educate the patient/family about BHU procedures/visitation: Yes.

## 2014-08-15 MED ORDER — SERTRALINE HCL 100 MG PO TABS
ORAL_TABLET | ORAL | Status: AC
  Filled 2014-08-15: qty 1

## 2014-08-15 MED ORDER — SERTRALINE HCL 100 MG PO TABS
ORAL_TABLET | ORAL | Status: AC
  Administered 2014-08-15: 100 mg via ORAL
  Filled 2014-08-15: qty 1

## 2014-08-15 NOTE — ED Notes (Signed)
Pt observed with no unusual behavior  Appropriate to stimulation  No verbalized needs or concerns at this time  NAD assessed  Continue to monitor 

## 2014-08-15 NOTE — ED Notes (Signed)
BEHAVIORAL HEALTH ROUNDING Patient sleeping: No. Patient alert and oriented: yes Behavior appropriate: Yes.  ; If no, describe:  Nutrition and fluids offered: yes Toileting and hygiene offered: Yes  Sitter present: q15 minute observations and security camera monitoring Law enforcement present: Yes  ODS  

## 2014-08-15 NOTE — ED Notes (Signed)

## 2014-08-15 NOTE — ED Notes (Signed)

## 2014-08-15 NOTE — ED Notes (Signed)
Shower completed   Pt observed with no unusual behavior  Appropriate to stimulation  No verbalized needs or concerns at this time  NAD assessed  Continue to monitor 

## 2014-08-15 NOTE — ED Notes (Signed)
Lunch provided  Pt observed with no unusual behavior  Appropriate to stimulation  No verbalized needs or concerns at this time  NAD assessed  Continue to monitor 

## 2014-08-15 NOTE — ED Notes (Signed)
Breakfast provided   Patient observed lying in bed with eyes closed  Even, unlabored respirations observed   NAD pt appears to be sleeping  I will continue to monitor along with every 15 minute visual observations and ongoing security camera monitoring    

## 2014-08-15 NOTE — ED Notes (Signed)
Patient observed lying in bed with eyes closed  Even, unlabored respirations observed   NAD pt appears to be sleeping  I will continue to monitor along with every 15 minute visual observations and ongoing security camera monitoring    

## 2014-08-15 NOTE — ED Notes (Signed)
Pt. Up using bathroom. 

## 2014-08-15 NOTE — ED Notes (Signed)

## 2014-08-15 NOTE — ED Notes (Signed)
BEHAVIORAL HEALTH ROUNDING Patient sleeping: Yes.   Patient alert and oriented: not applicable Behavior appropriate: Yes.    Nutrition and fluids offered: No Toileting and hygiene offered: No Sitter present: q15 minute observations and security camera monitoring Law enforcement present: Yes [employer] 

## 2014-08-15 NOTE — ED Notes (Signed)
BEHAVIORAL HEALTH ROUNDING Patient sleeping: No. Patient alert and oriented: yes Behavior appropriate: Yes.  ; If no, describe:  Nutrition and fluids offered: Yes  Toileting and hygiene offered: Yes  Sitter present: no Law enforcement present: Yes  

## 2014-08-15 NOTE — ED Notes (Signed)
Pt. Up and using bathroom. 

## 2014-08-15 NOTE — ED Notes (Signed)
BEHAVIORAL HEALTH ROUNDING Patient sleeping: yes Patient alert and oriented: eyes closed Behavior appropriate: Yes.  ; If no, describe:  Nutrition and fluids offered: Yes  Toileting and hygiene offered: Yes  Sitter present: q15 minute observations  Law enforcement present: yes  [facility]S

## 2014-08-15 NOTE — ED Notes (Signed)
BEHAVIORAL HEALTH ROUNDING Patient sleeping: No. Patient alert and oriented: yes Behavior appropriate: Yes.   Nutrition and fluids offered: Yes  Toileting and hygiene offered: Yes  Sitter present: q15 min observations and security camera monitoring Law enforcement present: Yes [employer] 

## 2014-08-15 NOTE — ED Notes (Signed)
BEHAVIORAL HEALTH ROUNDING Patient sleeping: Yes.   Patient alert and oriented: eyes closed  Appears asleep Behavior appropriate: Yes.  ; If no, describe:  Nutrition and fluids offered: Yes  Toileting and hygiene offered: Yes  Sitter present: q15 minute observation  Law enforcement present: yes  ODS

## 2014-08-15 NOTE — ED Notes (Signed)
Pt. Using bathroom, pt. Also asked "Can you write in the computer that I have questions for the pych. Doctor on Monday".

## 2014-08-15 NOTE — ED Notes (Signed)
BEHAVIORAL HEALTH ROUNDING Patient sleeping: No. Patient alert and oriented: no Behavior appropriate: Yes.  ; If no, describe:  Nutrition and fluids offered: Yes  Toileting and hygiene offered: Yes  Sitter present: no Law enforcement present: Yes  

## 2014-08-15 NOTE — ED Provider Notes (Signed)
-----------------------------------------   12:23 AM on 08/15/2014 -----------------------------------------   BP 121/56 mmHg  Pulse 78  Temp(Src) 98.6 F (37 C) (Oral)  Resp 18  Ht 5\' 8"  (1.727 m)  Wt 150 lb (68.04 kg)  BMI 22.81 kg/m2  SpO2 100%  The patient had no acute events since last update.  Calm and cooperative at this time.  Disposition is pending per Psychiatry/Behavioral Medicine team recommendations.    Arnaldo NatalPaul F Lyrick Worland, MD 08/15/14 [phone]

## 2014-08-15 NOTE — ED Notes (Signed)
Supper provided  Pt observed with no unusual behavior  Appropriate to stimulation  No verbalized needs or concerns at this time  NAD assessed  Continue to monitor 

## 2014-08-15 NOTE — ED Notes (Signed)
Pt observed lying in bed - watching TV   Pt visualized with NAD  No verbalized needs or concerns at this time  Continue to monitor 

## 2014-08-15 NOTE — ED Notes (Signed)
Pt awake - meds administered as ordered

## 2014-08-15 NOTE — ED Notes (Signed)
He has talked to his mom on the phone  Pt requesting for me to "tell the doctor to hurry up and find me a new group home"   Pt reassured that social work is trying to find him a new group home   Pt then to the recliner to sit and watch TV

## 2014-08-16 MED ORDER — SERTRALINE HCL 100 MG PO TABS
ORAL_TABLET | ORAL | Status: AC
  Filled 2014-08-16: qty 1

## 2014-08-16 MED ORDER — SERTRALINE HCL 100 MG PO TABS
ORAL_TABLET | ORAL | Status: AC
  Administered 2014-08-16: 100 mg via ORAL
  Filled 2014-08-16: qty 1

## 2014-08-16 MED ORDER — LEVETIRACETAM 500 MG PO TABS
ORAL_TABLET | ORAL | Status: AC
  Filled 2014-08-16: qty 2

## 2014-08-16 NOTE — ED Notes (Signed)
Pt sitting in the common area. Ate all his snack.

## 2014-08-16 NOTE — ED Notes (Signed)
BEHAVIORAL HEALTH ROUNDING  Patient sleeping: Yes.  Patient alert and oriented: no  Behavior appropriate: Yes. ; If no, describe:  Nutrition and fluids offered: No  Toileting and hygiene offered: No  Sitter present: no  Law enforcement present: Yes   

## 2014-08-16 NOTE — ED Notes (Signed)

## 2014-08-16 NOTE — ED Notes (Signed)
BEHAVIORAL HEALTH ROUNDING Patient sleeping: No. Patient alert and oriented: yes Behavior appropriate: Yes.  ; If no, describe:  Nutrition and fluids offered: No Toileting and hygiene offered: Yes  Sitter present: no Law enforcement present: Yes  

## 2014-08-16 NOTE — ED Notes (Signed)
Pt given snack of cracks, pbutter and juice.

## 2014-08-16 NOTE — ED Notes (Signed)
Pt in and out of room. Cooperative. To the restroom adlib. Unable to fill out 2 hr assessment as it does not accept it.

## 2014-08-16 NOTE — ED Notes (Signed)
Pt sleeping comfortably, awakened and given snack. Pt quiet and cooperative

## 2014-08-16 NOTE — Consult Note (Signed)
  [facility]. Discused pt with Staff. And chart reviewed. S Pt was seen laying in bed. No change in behavior reported except that he gets  Irritable and aggressive. O Disheveled in appearacnce.  Alert and oriented with help. Gets irritable and agitated Real easy. Assaultive and aggressive and needs constant re-direction. Cognition is below  Average and does not appear to be responding to internal stimuli. I/J Impaired and behavior is Unpredictable and is considered dangerous to self and others. I/J impaired. Imp Autism Spectrum disorder. Mental Disabiliites - Mild to Moderate. REc continue current treatment and re-initiate IVC and wait for placement

## 2014-08-16 NOTE — ED Notes (Signed)
Pt showered

## 2014-08-16 NOTE — ED Notes (Signed)
BEHAVIORAL HEALTH ROUNDING Patient sleeping: Yes.   Patient alert and oriented: not applicable Behavior appropriate: Yes.  ; If no, describe:  Nutrition and fluids offered: No Toileting and hygiene offered: No Sitter present: no Law enforcement present: Yes  

## 2014-08-16 NOTE — ED Notes (Signed)
BEHAVIORAL HEALTH ROUNDING Patient sleeping: No. Patient alert and oriented: yes Behavior appropriate: Yes.  ;  Nutrition and fluids offered: Yes  Toileting and hygiene offered: Yes  Sitter present: not applicable Law enforcement present: Yes   

## 2014-08-16 NOTE — ED Notes (Signed)
Patient IVC/ Pending Placement/ Needs IVC renewed 08-17-14 @0036 

## 2014-08-16 NOTE — ED Notes (Signed)
Pt on phone at this time 

## 2014-08-16 NOTE — ED Notes (Signed)
Pt in the common area - has been there a few hours lounging and watching tv.

## 2014-08-16 NOTE — ED Notes (Signed)
BEHAVIORAL HEALTH ROUNDING Patient sleeping: No. Patient alert and oriented: yes Behavior appropriate: Yes.  ; If no, describe:  Nutrition and fluids offered: Yes  Toileting and hygiene offered: Yes  Sitter present: no Law enforcement present: Yes  

## 2014-08-16 NOTE — ED Notes (Signed)
BEHAVIORAL HEALTH ROUNDING Patient sleeping: Yes.   Patient alert and oriented: no Behavior appropriate: Yes.  ; If no, describe:  Nutrition and fluids offered: Yes  Toileting and hygiene offered: Yes  Sitter present: no Law enforcement present: Yes  

## 2014-08-16 NOTE — ED Notes (Addendum)
Pt eating breakfast calm and cooperative at this time.  

## 2014-08-16 NOTE — ED Notes (Signed)
Pt done with shower.

## 2014-08-16 NOTE — ED Provider Notes (Signed)
-----------------------------------------   11:29 PM on 08/16/2014 -----------------------------------------   BP 102/60 mmHg  Pulse 79  Temp(Src) 98.7 F (37.1 C) (Oral)  Resp 18  Ht 5\' 8"  (1.727 m)  Wt 150 lb (68.04 kg)  BMI 22.81 kg/m2  SpO2 98%  The patient had no acute events since last update.  Calm and cooperative at this time.  Disposition is pending per Psychiatry/Behavioral Medicine team recommendations.    Arnaldo NatalPaul F Malinda, MD 08/16/14 2329

## 2014-08-16 NOTE — ED Provider Notes (Signed)
-----------------------------------------   7:09 AM on 08/16/2014 -----------------------------------------   BP 104/54 mmHg  Pulse 78  Temp(Src) 98.4 F (36.9 C) (Oral)  Resp 20  Ht 5\' 8"  (1.727 m)  Wt 150 lb (68.04 kg)  BMI 22.81 kg/m2  SpO2 99%  No acute events overnight. Vitals reviewed. Patient remains medically cleared.  Disposition is pending per Psychiatry/Behavioral Medicine team recommendations.   Jene Everyobert Gerard Cantara, MD 08/16/14 [phone]

## 2014-08-16 NOTE — ED Notes (Signed)
Pt. Using bathroom. 

## 2014-08-17 MED ORDER — SERTRALINE HCL 100 MG PO TABS
ORAL_TABLET | ORAL | Status: AC
  Administered 2014-08-17: 100 mg via ORAL
  Filled 2014-08-17: qty 1

## 2014-08-17 MED ORDER — LORAZEPAM 0.5 MG PO TABS
ORAL_TABLET | ORAL | Status: AC
  Filled 2014-08-17: qty 1

## 2014-08-17 MED ORDER — SERTRALINE HCL 100 MG PO TABS
ORAL_TABLET | ORAL | Status: AC
  Filled 2014-08-17: qty 1

## 2014-08-17 NOTE — ED Notes (Signed)
BEHAVIORAL HEALTH ROUNDING Patient sleeping: No. Patient alert and oriented: yes Behavior appropriate: Yes.   Nutrition and fluids offered: Yes  Toileting and hygiene offered: Yes  Sitter present: q15 min observations and security camera monitoring Law enforcement present: Yes [employer] 

## 2014-08-17 NOTE — ED Notes (Signed)
Patient observed lying in bed with eyes closed  Even, unlabored respirations observed   NAD pt appears to be sleeping  I will continue to monitor along with every 15 minute visual observations and ongoing security camera monitoring    

## 2014-08-17 NOTE — Consult Note (Signed)
  Psychiatry: Patient with autism spectrum disorder and mental retardation. No new complaints. Behavior unchanged. No sign of seizures. On review of systems he has no new complaints. Physical exam unchanged.  Patient continues to wait in the emergency room for referral to either long-term treatment or the obtaining of a waiver to allow for a higher level of outpatient care. No change to treatment plan.

## 2014-08-17 NOTE — ED Notes (Signed)
Pt is sitting in a recliner in the dayroom  - TV is on  Pt observed with no unusual behavior  Appropriate to stimulation  No verbalized needs or concerns at this time  NAD assessed  Continue to monitor

## 2014-08-17 NOTE — ED Notes (Signed)
PT  IVC  (4TH SET)  SEEN  BY  DR  CHALLA  ON  08/16/14  PENDING   PLACEMENT  ALL IVC  PAPERWORK  ON  CHART

## 2014-08-17 NOTE — ED Notes (Signed)
BEHAVIORAL HEALTH ROUNDING Patient sleeping: Yes.   Patient alert and oriented: eyes closed  Even, unlabored respirations observed Behavior appropriate: Yes.  ; If no, describe:  Nutrition and fluids offered: Yes  Toileting and hygiene offered: Yes  Sitter present: q15 minute observations Law enforcement present: yes  ODS

## 2014-08-17 NOTE — ED Notes (Signed)

## 2014-08-17 NOTE — ED Notes (Signed)
BEHAVIORAL HEALTH ROUNDING Patient sleeping: Yes.   Patient alert and oriented: q15 minute observations Behavior appropriate: Yes.  ; If no, describe:  Nutrition and fluids offered: Yes  Toileting and hygiene offered: Yes  Sitter present: q15 minute observations Law enforcement present: yes  ODS

## 2014-08-17 NOTE — ED Notes (Signed)
BEHAVIORAL HEALTH ROUNDING Patient sleeping: Yes.   Patient alert and oriented: not applicable Behavior appropriate: Yes.  ; If no, describe: SLEEPING Nutrition and fluids offered: No Toileting and hygiene offered: No Sitter present: not applicable Law enforcement present: Yes ODS 

## 2014-08-17 NOTE — ED Notes (Signed)
BEHAVIORAL HEALTH ROUNDING Patient sleeping: Yes.   Patient alert and oriented: not applicable SLEEPING Behavior appropriate: Yes.  ; If no, describe: SLEEPING Nutrition and fluids offered: No SLEEPING Toileting and hygiene offered: NoSLEEPING Sitter present: not applicable Law enforcement present: Yes ODS 

## 2014-08-17 NOTE — ED Notes (Signed)
Pt observed with no unusual behavior  Appropriate to stimulation  No verbalized needs or concerns at this time  NAD assessed  Continue to monitor 

## 2014-08-17 NOTE — ED Notes (Signed)
ED BHU PLACEMENT JUSTIFICATION Is the patient under IVC or is there intent for IVC: Yes.    Is IVC current? Yes Is the patient medically cleared: Yes.   Is there vacancy in the ED BHU: Yes.   Is the population mix appropriate for patient: Yes.   Is the patient awaiting placement in inpatient or outpatient setting: Yes.   Has the patient had a psychiatric consult: Yes.   Survey of unit performed for contraband, proper placement and condition of furniture, tampering with fixtures in bathroom, shower, and each patient room: Yes.  ; Findings: none APPEARANCE/BEHAVIOR Cooperative,calm NEURO ASSESSMENT Orientation: person, place Hallucinations: None noted at this time Speech: Normal Gait: normal RESPIRATORY ASSESSMENT Breathing Pattern-regular, no respiratory distress noted CARDIOVASCULAR ASSESSMENT Skin color appropriate for age and race GASTROINTESTINAL ASSESSMENT no GI distress noted EXTREMITIES Moves all extremities, no distress noted PLAN OF CARE Provide calm/safe environment. Vital signs assessed twice daily. ED BHU Assessment once each 12-hour shift. Collaborate with intake RN daily or as condition indicates. Assure the ED provider has rounded once each shift. Provide and encourage hygiene. Provide redirection as needed. Assess for escalating behavior; address immediately and inform ED provider.  Assess family dynamic and appropriateness for visitation as needed: Yes. Educate the patient/family about BHU procedures/visitation: Yes.

## 2014-08-17 NOTE — ED Notes (Signed)
Supper provided  Pt observed with no unusual behavior  Appropriate to stimulation  No verbalized needs or concerns at this time  NAD assessed  Continue to monitor 

## 2014-08-17 NOTE — ED Notes (Signed)
Lunch provided  Pt observed with no unusual behavior  Appropriate to stimulation  No verbalized needs or concerns at this time  NAD assessed  Continue to monitor 

## 2014-08-17 NOTE — ED Notes (Signed)
BEHAVIORAL HEALTH ROUNDING Patient sleeping: No. Patient alert and oriented: yes Behavior appropriate: Yes.  ; If no, describe:  Nutrition and fluids offered: yes Toileting and hygiene offered: Yes  Sitter present: q15 minute observations and security camera monitoring Law enforcement present: Yes  ODS  

## 2014-08-17 NOTE — ED Notes (Signed)
Breakfast provided   Patient observed lying in bed with eyes closed  Even, unlabored respirations observed   NAD pt appears to be sleeping  I will continue to monitor along with every 15 minute visual observations and ongoing security camera monitoring    

## 2014-08-17 NOTE — ED Notes (Signed)
BEHAVIORAL HEALTH ROUNDING Patient sleeping: Yes.   Patient alert and oriented: not applicable Behavior appropriate: Yes.  ; If no, describe: SLEEPING Nutrition and fluids offered: No SLEEPING Toileting and hygiene offered: NoSLEEPING Sitter present: not applicable Law enforcement present: Yes ODS 

## 2014-08-17 NOTE — ED Notes (Signed)

## 2014-08-17 NOTE — ED Notes (Signed)
Am meds administered as ordered    Assessment completed  Pt denies pain

## 2014-08-17 NOTE — ED Notes (Signed)
Pt came to nurses station wanting to know why "soical worker didn't come see me today, they said that the social worker was going to come see me on Monday, and today is Monday, why didn't they come see me" Officer Durwin NoraDixon reminded pt that it is almost 11 pm on Monday and that he needs to go to sleep.  I had told pt earlier that I will voice his request to Intake Deanna ArtisKeisha later this evening.

## 2014-08-18 MED ORDER — LEVETIRACETAM 500 MG PO TABS
ORAL_TABLET | ORAL | Status: AC
  Filled 2014-08-18: qty 2

## 2014-08-18 MED ORDER — SERTRALINE HCL 100 MG PO TABS
ORAL_TABLET | ORAL | Status: AC
  Administered 2014-08-19: 100 mg via ORAL
  Filled 2014-08-18: qty 1

## 2014-08-18 MED ORDER — CARBAMAZEPINE 200 MG PO TABS
ORAL_TABLET | ORAL | Status: AC
  Administered 2014-08-18: 800 mg
  Filled 2014-08-18: qty 4

## 2014-08-18 MED ORDER — SERTRALINE HCL 100 MG PO TABS
ORAL_TABLET | ORAL | Status: AC
  Filled 2014-08-18: qty 1

## 2014-08-18 MED ORDER — CARBAMAZEPINE 200 MG PO TABS
ORAL_TABLET | ORAL | Status: AC
  Filled 2014-08-18: qty 4

## 2014-08-18 MED ORDER — LORAZEPAM 0.5 MG PO TABS
ORAL_TABLET | ORAL | Status: AC
  Administered 2014-08-19: 0.5 mg via ORAL
  Filled 2014-08-18: qty 1

## 2014-08-18 NOTE — ED Notes (Signed)
BEHAVIORAL HEALTH ROUNDING Patient sleeping: No. Patient alert and oriented: yes Behavior appropriate: Yes.  ; If no, describe:  Nutrition and fluids offered: Yes  Toileting and hygiene offered: Yes  Sitter present: yes Law enforcement present: Yes  

## 2014-08-18 NOTE — ED Notes (Signed)
Consult completed by MD.

## 2014-08-18 NOTE — ED Notes (Signed)
BEHAVIORAL HEALTH ROUNDING Patient sleeping: Yes.   Patient alert and oriented: not applicable Behavior appropriate: Yes.  ; If no, describe:  Nutrition and fluids offered: No Toileting and hygiene offered: No Sitter present: yes Law enforcement present: Yes   

## 2014-08-18 NOTE — ED Notes (Signed)
PT  IVC  (4TH SET)  PENDING  PLACEMENT  ALL  PAPERWORK  ON   CHART

## 2014-08-18 NOTE — ED Notes (Signed)
BEHAVIORAL HEALTH ROUNDING Patient sleeping: Yes.   Patient alert and oriented: yes Behavior appropriate: Yes.  ; If no, describe:  Nutrition and fluids offered: Yes  Toileting and hygiene offered: Yes  Sitter present: yes Law enforcement present: Yes     ENVIRONMENTAL ASSESSMENT Potentially harmful objects out of patient reach: Yes.   Personal belongings secured: Yes.   Patient dressed in hospital provided attire only: Yes.   Plastic bags out of patient reach: Yes.   Patient care equipment (cords, cables, call bells, lines, and drains) shortened, removed, or accounted for: Yes.   Equipment and supplies removed from bottom of stretcher: Yes.   Potentially toxic materials out of patient reach: Yes.   Sharps container removed or out of patient reach: Yes.   

## 2014-08-18 NOTE — ED Notes (Signed)
BEHAVIORAL HEALTH ROUNDING Patient sleeping: Yes.   Patient alert and oriented: not applicable Behavior appropriate: Yes.    Nutrition and fluids offered: No Toileting and hygiene offered: No Sitter present: q15 minute observations and security camera monitoring Law enforcement present: Yes [employer] 

## 2014-08-18 NOTE — ED Notes (Signed)
[facility], pt states that he wants to know if the "social worker can get my pills in liquid form?" pt reassurred that pharmacy would be asked, not distress noted, pt back to his room and lying down in the bed

## 2014-08-18 NOTE — ED Notes (Signed)
BEHAVIORAL HEALTH ROUNDING Patient sleeping: No. Patient alert: yes Behavior appropriate: Yes.  ; If no, describe: Nutrition and fluids offered: Yes  Toileting and hygiene offered: Yes shower taken per patient request Sitter present: yes Law enforcement present: Yes

## 2014-08-18 NOTE — ED Provider Notes (Signed)
-----------------------------------------   7:27 AM on 08/18/2014 -----------------------------------------   BP 109/51 mmHg  Pulse 66  Temp(Src) 98.2 F (36.8 C) (Oral)  Resp 18  Ht 5\' 8"  (1.727 m)  Wt 150 lb (68.04 kg)  BMI 22.81 kg/m2  SpO2 99%  The patient had no acute events since last update.  Calm and cooperative at this time.  Disposition is pending per Psychiatry/Behavioral Medicine team recommendations.     Rebecka ApleyAllison P Nadalyn Deringer, MD 08/18/14 [phone]

## 2014-08-18 NOTE — ED Notes (Signed)
Report given to Misty RN 

## 2014-08-18 NOTE — ED Notes (Signed)
BEHAVIORAL HEALTH ROUNDING Patient sleeping: No. Patient alert  yes Behavior appropriate: Yes.  ; If no, describe:  Nutrition and fluids offered: Yes  Toileting and hygiene offered: Yes  Sitter present: yes Law enforcement present: Yes  

## 2014-08-18 NOTE — ED Notes (Signed)
Called pharmacy and spoke with Sallye OberLouise regarding need for [facility], reports she will see if available and if so send to ED Flex care area.

## 2014-08-18 NOTE — Consult Note (Signed)
  Psychiatry: Daily note on this young man with a history of autism and mental retardation. No change to clinical picture. He has no new complaints. Review of systems is unchanged. Mental status exam is unchanged. Patient has not been aggressive or agitated. Affect remains anxious and dysphoric but distractible. Medically he has no new complaints. Vital signs stable.  Unfortunate situation of patient in the emergency room for an extended period of time because we cannot find any other placement. No indication to change any medicines.

## 2014-08-19 MED ORDER — CARBAMAZEPINE 200 MG PO TABS
ORAL_TABLET | ORAL | Status: AC
  Administered 2014-08-19: 800 mg
  Filled 2014-08-19: qty 4

## 2014-08-19 MED ORDER — CARBAMAZEPINE ER 200 MG PO CP12
ORAL_CAPSULE | ORAL | Status: AC
  Filled 2014-08-19: qty 4

## 2014-08-19 MED ORDER — SERTRALINE HCL 100 MG PO TABS
ORAL_TABLET | ORAL | Status: AC
  Filled 2014-08-19: qty 1

## 2014-08-19 MED ORDER — SERTRALINE HCL 100 MG PO TABS
ORAL_TABLET | ORAL | Status: AC
Start: 2014-08-19 — End: 2014-08-19
  Administered 2014-08-19: 100 mg via ORAL
  Filled 2014-08-19: qty 1

## 2014-08-19 MED ORDER — LEVETIRACETAM 500 MG PO TABS
ORAL_TABLET | ORAL | Status: AC
  Administered 2014-08-19: 750 mg via ORAL
  Filled 2014-08-19: qty 2

## 2014-08-19 NOTE — ED Notes (Signed)
Snack tray given 

## 2014-08-19 NOTE — ED Provider Notes (Signed)
Morning vitals were reviewed. Per nursing home report there are no acute events overnight. Patient has been here for a long time awaiting group home placement. The patient does have a significant aggression past and has burned many bridges. Social work is working on disposition for him. He does currently remain on involuntary commitment as he cannot leave of his own accord due to his inability to take care of himself and violent and psychiatric history.  Governor Rooksebecca Helen Winterhalter, MD 08/19/14 1520

## 2014-08-19 NOTE — ED Notes (Signed)
BEHAVIORAL HEALTH ROUNDING Patient sleeping: No. Patient alert and oriented: yes Behavior appropriate: Yes.  ; If no, describe:  Nutrition and fluids offered: Yes  Toileting and hygiene offered: Yes  Sitter present: no Law enforcement present: Yes ODS 

## 2014-08-19 NOTE — ED Notes (Signed)

## 2014-08-19 NOTE — Consult Note (Signed)
  Psychiatry: Follow-up for this 24 year old man with autism and mental retardation. On interview today the patient has no new complaints. All he repeats is that he wants us to find him a group home. He denies any physical symptoms. On mental status exam he is awake and alert and oriented. Speech is minimal and simple. Thoughts very simple and concrete. No change from baseline. Has not been violent or threatening. No sign of side effects to medicine.  Patient is unchanged from all the previous days. Still awaiting appropriate placement. No change to treatment plan

## 2014-08-19 NOTE — ED Notes (Signed)
Meds given per md order 

## 2014-08-19 NOTE — ED Notes (Signed)
BEHAVIORAL HEALTH ROUNDING Patient sleeping: Yes.   Patient alert and oriented: not applicable Behavior appropriate: Yes.    Nutrition and fluids offered: No Toileting and hygiene offered: No Sitter present: q15 minute observations and security camera monitoring Law enforcement present: Yes [employer] 

## 2014-08-19 NOTE — ED Notes (Signed)
ED BHU PLACEMENT JUSTIFICATION Is the patient under IVC or is there intent for IVC: Yes.   Is the patient medically cleared: Yes.   Is there vacancy in the ED BHU: Yes.   Is the population mix appropriate for patient: Yes.   Is the patient awaiting placement in inpatient or outpatient setting: Yes.   Has the patient had a psychiatric consult: Yes.   Survey of unit performed for contraband, proper placement and condition of furniture, tampering with fixtures in bathroom, shower, and each patient room: Yes.  ; Findings: all clear APPEARANCE/BEHAVIOR calm, cooperative and adequate rapport can be established NEURO ASSESSMENT Orientation: time, place and person Hallucinations: No.None noted (Hallucinations) Speech: Normal Gait: normal RESPIRATORY ASSESSMENT WNL CARDIOVASCULAR ASSESSMENT WNL GASTROINTESTINAL ASSESSMENT WNL EXTREMITIES ROM of all joints is normal PLAN OF CARE Provide calm/safe environment. Vital signs assessed twice daily. ED BHU Assessment once each 12-hour shift. Collaborate with intake RN daily or as condition indicates. Assure the ED provider has rounded once each shift. Provide and encourage hygiene. Provide redirection as needed. Assess for escalating behavior; address immediately and inform ED provider.  Assess family dynamic and appropriateness for visitation as needed: Yes.  ; If necessary, describe findings:  Educate the patient/family about BHU procedures/visitation: Yes.  ; If necessary, describe findings:

## 2014-08-19 NOTE — ED Notes (Signed)
BEHAVIORAL HEALTH ROUNDING Patient sleeping: Yes.   Patient alert and oriented: not applicable Behavior appropriate: Yes.  ; If no, describe:  Nutrition and fluids offered: No Toileting and hygiene offered: Yes  Sitter present: yes Law enforcement present: Yes  

## 2014-08-19 NOTE — ED Notes (Signed)
ED BHU PLACEMENT JUSTIFICATION Is the patient under IVC or is there intent for IVC: Yes.   Is the patient medically cleared: Yes.   Is there vacancy in the ED BHU: Yes.   Is the population mix appropriate for patient: Yes.   Is the patient awaiting placement in inpatient or outpatient setting: Yes.   Has the patient had a psychiatric consult: Yes.   Survey of unit performed for contraband, proper placement and condition of furniture, tampering with fixtures in bathroom, shower, and each patient room: Yes.  ; Findings:  APPEARANCE/BEHAVIOR calm, cooperative and adequate rapport can be established NEURO ASSESSMENT Orientation: place and person Hallucinations: No.None noted (Hallucinations) Speech: Normal Gait: normal RESPIRATORY ASSESSMENT Normal expansion.  Clear to auscultation.  No rales, rhonchi, or wheezing. CARDIOVASCULAR ASSESSMENT regular rate and rhythm, S1, S2 normal, no murmur, click, rub or gallop GASTROINTESTINAL ASSESSMENT soft, nontender, BS WNL, no r/g EXTREMITIES normal strength, tone, and muscle mass PLAN OF CARE Provide calm/safe environment. Vital signs assessed twice daily. ED BHU Assessment once each 12-hour shift. Collaborate with intake RN daily or as condition indicates. Assure the ED provider has rounded once each shift. Provide and encourage hygiene. Provide redirection as needed. Assess for escalating behavior; address immediately and inform ED provider.  Assess family dynamic and appropriateness for visitation as needed: Yes.  ; If necessary, describe findings:  Educate the patient/family about BHU procedures/visitation: Yes.  ; If necessary, describe findings:  

## 2014-08-19 NOTE — ED Notes (Addendum)
Pt asking for "something for my arms" pt asked by this RN what was wrong with his arms were they hurting, he states,"no, I need something strong for my arms" pt unable to state what or why he needs something for his arms. Pt reassured that we would take care of him

## 2014-08-19 NOTE — ED Notes (Signed)
Status remains the same all paperwork on chart/IVC/Pending placement

## 2014-08-19 NOTE — ED Notes (Addendum)
[facility], care assumed.  Pt calm and cooperative at this time with no complaints, pt awaiting placement no distress noted.  Rounder doing q15 min rounds

## 2014-08-19 NOTE — ED Notes (Signed)
BEHAVIORAL HEALTH ROUNDING Patient sleeping: Yes.   Patient alert and oriented: not applicable Behavior appropriate: No.; If no, describe: sleeping}sleeping Nutrition and fluids offered: No Toileting and hygiene offered: Yes  Sitter present: yes Law enforcement present: Yes

## 2014-08-19 NOTE — Progress Notes (Signed)
Patient has a Care Coordinator Tomie Chinaammy Rivers [phone] and group home may take him with additional funding provided if Medicaid will provide additional funding for support with patient's behaviors. Patient has an Teacher, [employer] referral in as well who will need to be notified at discharge.

## 2014-08-19 NOTE — ED Notes (Signed)

## 2014-08-19 NOTE — ED Notes (Signed)
ED BHU PLACEMENT JUSTIFICATION Is the patient under IVC or is there intent for IVC: Yes.    Is IVC current? Yes Is the patient medically cleared: Yes.   Is there vacancy in the ED BHU: Yes.   Is the population mix appropriate for patient: Yes.   Is the patient awaiting placement in inpatient or outpatient setting: Yes.   Has the patient had a psychiatric consult: Yes.   Survey of unit performed for contraband, proper placement and condition of furniture, tampering with fixtures in bathroom, shower, and each patient room: Yes.  ; Findings: none APPEARANCE/BEHAVIOR Cooperative,calm NEURO ASSESSMENT Orientation: person, place Hallucinations: none noted at this time Speech: Normal Gait: normal RESPIRATORY ASSESSMENT Breathing Pattern-regular, no respiratory distress noted CARDIOVASCULAR ASSESSMENT Skin color appropriate for age and race GASTROINTESTINAL ASSESSMENT no GI distress noted EXTREMITIES Moves all extremities, no distress noted PLAN OF CARE Provide calm/safe environment. Vital signs assessed twice daily. ED BHU Assessment once each 12-hour shift. Collaborate with intake RN daily or as condition indicates. Assure the ED provider has rounded once each shift. Provide and encourage hygiene. Provide redirection as needed. Assess for escalating behavior; address immediately and inform ED provider.  Assess family dynamic and appropriateness for visitation as needed: Yes.   Educate the patient/family about BHU procedures/visitation: Yes.

## 2014-08-19 NOTE — ED Notes (Signed)

## 2014-08-20 MED ORDER — SERTRALINE HCL 100 MG PO TABS
ORAL_TABLET | ORAL | Status: AC
  Administered 2014-08-20: 100 mg
  Filled 2014-08-20: qty 1

## 2014-08-20 MED ORDER — LEVETIRACETAM 500 MG PO TABS
ORAL_TABLET | ORAL | Status: AC
  Filled 2014-08-20: qty 2

## 2014-08-20 NOTE — ED Notes (Signed)
[facility] from [facility]. Pt. Sleeping, respirations regular and unlabored.  Will continue to monitor for safety via security cameras and Q 15 minute checks.

## 2014-08-20 NOTE — ED Notes (Signed)

## 2014-08-20 NOTE — ED Notes (Signed)

## 2014-08-20 NOTE — ED Notes (Signed)
Patient observed with no unusual behavior or acute distress. Patient with no verbalized needs or c/o at this time.... will continue to monitor and follow up as needed. Security staff monitoring patient on [employer] system.  

## 2014-08-20 NOTE — ED Notes (Signed)
Pt is sitting in a recliner in the dayroom  Pt observed with no unusual behavior  Appropriate to stimulation  No verbalized needs or concerns at this time  NAD assessed  Continue to monitor 

## 2014-08-20 NOTE — ED Notes (Signed)
Pt. Noted in day room. No complaints or concerns voiced. No distress or abnormal behavior noted. Will continue to monitor with security cameras. Q 15 minute rounds continue. 

## 2014-08-20 NOTE — ED Notes (Signed)
Still remains the same pending placement/ IVC 4th set all paperwork on chart

## 2014-08-20 NOTE — ED Notes (Signed)
meds administered as ordered  Assessment completed  Pt observed with no unusual behavior  Appropriate to stimulation  No verbalized needs or concerns at this time  NAD assessed  Continue to monitor 

## 2014-08-20 NOTE — Progress Notes (Signed)
Patient ID: Brendan Gamble, male   DOB: 01/23/1991, 24 y.o.   MRN: [id number] Psychiatry: Follow-up for 24 year old man with autism and mental retardation. No new complaints today. No change in behavior. His mental status remains unchanged. No indication for any change in treatment plan. Blood pressure is stable. Patient is still awaiting appropriate placement.

## 2014-08-20 NOTE — ED Notes (Signed)
Breakfast provided   Patient observed lying in bed with eyes closed  Even, unlabored respirations observed   NAD pt appears to be sleeping  I will continue to monitor along with every 15 minute visual observations and ongoing security camera monitoring    

## 2014-08-20 NOTE — Progress Notes (Signed)
CSW spoke with patient Care Coordinator Tomie Chinaammy Rivers [phone] who is working on getting patient funding changed from Family Living Moderate to Residential and is awaiting for approval. [facility] [place].

## 2014-08-20 NOTE — ED Notes (Signed)
BEHAVIORAL HEALTH ROUNDING Patient sleeping: No. Patient alert and oriented: yes Behavior appropriate: Yes.  ; If no, describe:  Nutrition and fluids offered: yes Toileting and hygiene offered: Yes  Sitter present: q15 minute observations and security camera monitoring Law enforcement present: Yes  ODS  

## 2014-08-20 NOTE — ED Notes (Addendum)
BEHAVIORAL HEALTH ROUNDING Patient sleeping: Yes.   Patient alert and oriented: eyes closed  Appears asleep Behavior appropriate: Yes.  ; If no, describe:  Nutrition and fluids offered: Yes  Toileting and hygiene offered: sleeping Sitter present: q 15 minute observations and security camera monitoring Law enforcement present: yes  ODS 

## 2014-08-20 NOTE — ED Notes (Signed)
Patient observed lying in bed with eyes closed  Even, unlabored respirations observed   NAD pt appears to be sleeping  I will continue to monitor along with every 15 minute visual observations and ongoing security camera monitoring    

## 2014-08-20 NOTE — ED Notes (Signed)
Lunch provided  Pt observed with no unusual behavior  Appropriate to stimulation  No verbalized needs or concerns at this time  NAD assessed  Continue to monitor 

## 2014-08-20 NOTE — ED Notes (Signed)
Pt laying in bed, no change in condition will continue to monitor.

## 2014-08-20 NOTE — ED Notes (Signed)

## 2014-08-20 NOTE — ED Notes (Addendum)
Supper provided  Pt observed with no unusual behavior  Appropriate to stimulation  No verbalized needs or concerns at this time  NAD assessed  Continue to monitor 

## 2014-08-20 NOTE — ED Notes (Signed)
Pt observed lying in bed watching TV  Pt observed with no unusual behavior  Appropriate to stimulation  No verbalized needs or concerns at this time  NAD assessed  Continue to monitor 

## 2014-08-20 NOTE — ED Notes (Signed)
Pt laying in bed no distress noted  

## 2014-08-20 NOTE — ED Notes (Signed)
BEHAVIORAL HEALTH ROUNDING Patient sleeping: Yes.   Patient alert and oriented: eyes closed  Appears asleep Behavior appropriate: Yes.  ; If no, describe:  Nutrition and fluids offered: Yes  Toileting and hygiene offered: sleeping Sitter present: q 15 minute observations and security camera monitoring Law enforcement present: yes  ODS 

## 2014-08-20 NOTE — ED Provider Notes (Signed)
-----------------------------------------   7:18 AM on 08/20/2014 -----------------------------------------   BP 101/57 mmHg  Pulse 73  Temp(Src) 98.1 F (36.7 C) (Oral)  Resp 18  Ht 5\' 8"  (1.727 m)  Wt 150 lb (68.04 kg)  BMI 22.81 kg/m2  SpO2 100%  The patient had no acute events since last update.  Calm and cooperative at this time.  Disposition is pending per Psychiatry/Behavioral Medicine team recommendations.    Arnaldo NatalPaul F Haru Anspaugh, MD 08/20/14 [phone]

## 2014-08-21 MED ORDER — SERTRALINE HCL 100 MG PO TABS
ORAL_TABLET | ORAL | Status: AC
  Administered 2014-08-21: 100 mg via ORAL
  Filled 2014-08-21: qty 1

## 2014-08-21 MED ORDER — CARBAMAZEPINE 200 MG PO TABS
ORAL_TABLET | ORAL | Status: AC
  Filled 2014-08-21: qty 4

## 2014-08-21 NOTE — ED Notes (Signed)
Assisted pt with the remote control  - he wanted the tv on channel 20 and he could not figure out the remote control this am   Reoriented to the remote and channels    Appropriate to stimulation   NAD assessed  Continue to monitor

## 2014-08-21 NOTE — ED Notes (Signed)
Pt given supper tray.

## 2014-08-21 NOTE — ED Notes (Signed)
Pt. Noted sleeping in room. No complaints or concerns voiced. No distress or abnormal behavior noted. Will continue to monitor with security cameras. Q 15 minute rounds continue. 

## 2014-08-21 NOTE — Consult Note (Signed)
  Psychiatry: Follow-up note daily for autistic patient with mild mental retardation. No behavior problems today. No new complaints. Mental status unchanged from previous examination. Encouragement and supportive therapy provided. No further change to treatment at this time. Still working on placement.

## 2014-08-21 NOTE — ED Notes (Signed)
Pt observed lying in bed - watching TV   Pt visualized with NAD  No verbalized needs or concerns at this time  Continue to monitor 

## 2014-08-21 NOTE — ED Notes (Signed)
Pt is currently in the shower.

## 2014-08-21 NOTE — ED Notes (Signed)
Pt breakfast tray delivered, pt remains sleeping with no distress at this time, will continue to monitor.

## 2014-08-21 NOTE — ED Notes (Signed)
Snack provided by ED Tech. Pt observed with no unusual behavior. Appropriate to stimulation. No verbalized needs or concerns at this time. NAD assessed. Will continue to monitor.  

## 2014-08-21 NOTE — ED Notes (Signed)
BEHAVIORAL HEALTH ROUNDING Patient sleeping: Yes.   Patient alert and oriented: eyes closed  Appears asleep Behavior appropriate: Yes.  ; If no, describe:  Nutrition and fluids offered: Yes  Toileting and hygiene offered: sleeping Sitter present: q 15 minute observations and security camera monitoring Law enforcement present: yes  ODS 

## 2014-08-21 NOTE — ED Notes (Signed)
Received sandwich tray and drink 

## 2014-08-21 NOTE — ED Notes (Signed)

## 2014-08-21 NOTE — ED Notes (Signed)
Patient observed lying in bed with eyes closed  Even, unlabored respirations observed   NAD pt appears to be sleeping  I will continue to monitor along with every 15 minute visual observations and ongoing security camera monitoring    

## 2014-08-21 NOTE — ED Notes (Signed)
This tech received report from Briana (Float tech) on this pt, pt peacefully resting at this time 

## 2014-08-21 NOTE — ED Notes (Signed)
Lunch provided  Pt observed with no unusual behavior  Appropriate to stimulation  No verbalized needs or concerns at this time  NAD assessed  Continue to monitor 

## 2014-08-21 NOTE — ED Notes (Signed)
ED BHU PLACEMENT JUSTIFICATION Is the patient under IVC or is there intent for IVC: Yes.   Is the patient medically cleared: Yes.   Is there vacancy in the ED BHU: Yes.   Is the population mix appropriate for patient: Yes.   Is the patient awaiting placement in inpatient or outpatient setting: Yes.   Has the patient had a psychiatric consult: Yes.   Survey of unit performed for contraband, proper placement and condition of furniture, tampering with fixtures in bathroom, shower, and each patient room: Yes.  ; Findings:  APPEARANCE/BEHAVIOR calm and cooperative NEURO ASSESSMENT Orientation: time Hallucinations: No.None noted (Hallucinations) Speech: Normal Gait: normal RESPIRATORY ASSESSMENT Normal expansion.  Clear to auscultation.  No rales, rhonchi, or wheezing. CARDIOVASCULAR ASSESSMENT regular rate and rhythm, S1, S2 normal, no murmur, click, rub or gallop GASTROINTESTINAL ASSESSMENT soft, nontender, BS WNL, no r/g EXTREMITIES normal strength, tone, and muscle mass PLAN OF CARE Provide calm/safe environment. Vital signs assessed twice daily. ED BHU Assessment once each 12-hour shift. Collaborate with intake RN daily or as condition indicates. Assure the ED provider has rounded once each shift. Provide and encourage hygiene. Provide redirection as needed. Assess for escalating behavior; address immediately and inform ED provider.  Assess family dynamic and appropriateness for visitation as needed: Yes.   Educate the patient/family about BHU procedures/visitation: Yes.

## 2014-08-21 NOTE — ED Notes (Signed)
Pt given telephone  

## 2014-08-21 NOTE — ED Notes (Signed)
BEHAVIORAL HEALTH ROUNDING Patient sleeping: No. Patient alert and oriented: yes Behavior appropriate: Yes.  ; If no, describe:  Nutrition and fluids offered: yes Toileting and hygiene offered: Yes  Sitter present: q15 minute observations and security camera monitoring Law enforcement present: Yes  ODS  

## 2014-08-21 NOTE — ED Notes (Signed)
Breakfast provided   Patient observed lying in bed with eyes closed  Even, unlabored respirations observed   NAD pt appears to be sleeping  I will continue to monitor along with every 15 minute visual observations and ongoing security camera monitoring    

## 2014-08-21 NOTE — ED Notes (Signed)
Pt. Noted in room. No complaints or concerns voiced. No distress or abnormal behavior noted. Will continue to monitor with security cameras. Q 15 minute rounds continue. 

## 2014-08-21 NOTE — ED Notes (Signed)
IVC/Pending placement/All paper work on chart

## 2014-08-21 NOTE — ED Provider Notes (Signed)
-----------------------------------------   8:26 AM on 08/21/2014 -----------------------------------------   BP 102/50 mmHg  Pulse 56  Temp(Src) 98.7 F (37.1 C) (Oral)  Resp 18  Ht 5\' 8"  (1.727 m)  Wt 150 lb (68.04 kg)  BMI 22.81 kg/m2  SpO2 98%  The patient had no acute events since last update.  Patient has a history of mental retardation, which is making placement exceedingly difficult. According to last psychiatric note, they are discussing the patient with several family care homes for disposition. Patient has now been in the care of the emergency department for approximately 25 days. We will continue to discuss with behavior medicine for appropriate disposition.  Minna AntisKevin Kileigh Ortmann, MD 08/21/14 [phone]

## 2014-08-21 NOTE — ED Notes (Signed)
Pt observed ambulating from the BR  Pt observed with no unusual behavior  Appropriate to stimulation  No verbalized needs or concerns at this time  NAD assessed  Continue to monitor

## 2014-08-21 NOTE — ED Notes (Signed)

## 2014-08-22 MED ORDER — CARBAMAZEPINE ER 200 MG PO CP12
800.0000 mg | ORAL_CAPSULE | Freq: Two times a day (BID) | ORAL | Status: DC
  Administered 2014-08-23 – 2014-09-12 (×36): 800 mg via ORAL
  Filled 2014-08-22 (×49): qty 4

## 2014-08-22 NOTE — ED Notes (Signed)
Pt continues to be on IVC and is pending placement per consult note by Dr. Toni Amendlapacs.

## 2014-08-22 NOTE — ED Notes (Signed)
Pt showered

## 2014-08-22 NOTE — ED Notes (Signed)
BEHAVIORAL HEALTH ROUNDING Patient sleeping: no Patient alert and oriented: yes Behavior appropriate: Yes.  ; If no, describe:  Nutrition and fluids offered: Yes  Toileting and hygiene offered: Yes  Sitter present: no Law enforcement present: Yes  

## 2014-08-22 NOTE — ED Notes (Signed)
Patient assigned to appropriate care area. Patient oriented to unit/care area: Informed that, for their safety, care areas are designed for safety and monitored by security cameras at all times; and visiting hours explained to patient. Patient verbalizes understanding, and verbal contract for safety obtained. 

## 2014-08-22 NOTE — ED Provider Notes (Signed)
-----------------------------------------   12:59 AM on 08/22/2014 -----------------------------------------   BP 114/68 mmHg  Pulse 100  Temp(Src) 98.7 F (37.1 C) (Oral)  Resp 18  Ht 5\' 8"  (1.727 m)  Wt 150 lb (68.04 kg)  BMI 22.81 kg/m2  SpO2 100%  The patient had no acute events since last update.  Calm and cooperative at this time.  Disposition is pending per Psychiatry/Behavioral Medicine team recommendations.     Myrna Blazeravid Matthew Kaneesha Constantino, MD 08/22/14 [phone]

## 2014-08-22 NOTE — ED Notes (Signed)
BEHAVIORAL HEALTH ROUNDING  Patient sleeping: Yes  Patient alert and oriented: Yes  Behavior appropriate: Yes. ; If no, describe:  Nutrition and fluids offered: Yes  Toileting and hygiene offered: Yes  Sitter present: q15 minute observations and security camera monitoring  Law enforcement present: Yes ODS   

## 2014-08-22 NOTE — ED Notes (Signed)
BEHAVIORAL HEALTH ROUNDING Patient sleeping: No. Patient alert and oriented: yes Behavior appropriate: Yes.  ; If no, describe:  Nutrition and fluids offered: Yes  Toileting and hygiene offered: Yes  Sitter present: no Law enforcement present: Yes  

## 2014-08-22 NOTE — ED Notes (Signed)
BEHAVIORAL HEALTH ROUNDING Patient sleeping: Yes.   Patient alert and oriented: yes Behavior appropriate: Yes.  ; If no, describe:  Nutrition and fluids offered: Yes  Toileting and hygiene offered: Yes  Sitter present: no Law enforcement present: Yes  

## 2014-08-22 NOTE — ED Notes (Signed)

## 2014-08-22 NOTE — ED Notes (Signed)
Pt observed lying in bed, via camera security. Pt visualized with NAD. No verbalized needs or concerns at this time. Continue to monitor.  

## 2014-08-22 NOTE — ED Notes (Signed)
BEHAVIORAL HEALTH ROUNDING  Patient sleeping: No.  Patient alert and oriented: Alert Behavior appropriate: Yes. ; If no, describe:  Nutrition and fluids offered: Yes  Toileting and hygiene offered: Yes  Sitter present: q15 minute observations and security camera monitoring  Law enforcement present: Yes ODS

## 2014-08-22 NOTE — ED Notes (Signed)

## 2014-08-22 NOTE — ED Notes (Signed)
Pt. Up using the bathroom.

## 2014-08-22 NOTE — ED Notes (Signed)
BEHAVIORAL HEALTH ROUNDING  Patient sleeping: Yes  Patient alert and oriented: Sleeping  Behavior appropriate: Yes. ; If no, describe:  Nutrition and fluids offered: Sleeping  Toileting and hygiene offered: Sleeping  Sitter present: q15 minute observations and security camera monitoring  Law enforcement present: Yes ODS  

## 2014-08-22 NOTE — ED Notes (Signed)
BEHAVIORAL HEALTH ROUNDING Patient sleeping: No. Patient alert and oriented: yes Behavior appropriate: Yes.  ; If no, describe:  Nutrition and fluids offered: Yes  Toileting and hygiene offered: Yes  Sitter present:no Law enforcement present: yes 

## 2014-08-22 NOTE — ED Notes (Signed)
BEHAVIORAL HEALTH ROUNDING  Patient sleeping: Yes  Patient alert and oriented: Yes  Behavior appropriate: Yes. ; If no, describe:  Nutrition and fluids offered: Yes  Toileting and hygiene offered: Yes  Sitter present: q15 minute observations and security camera monitoring  Law enforcement present: Yes ODS

## 2014-08-22 NOTE — ED Notes (Signed)

## 2014-08-23 LAB — GLUCOSE, CAPILLARY: Glucose-Capillary: 96 mg/dL (ref 65–99)

## 2014-08-23 NOTE — ED Notes (Signed)
BEHAVIORAL HEALTH ROUNDING Patient sleeping: No. Patient alert and oriented: yes Behavior appropriate: Yes.  ; If no, describe:  Nutrition and fluids offered: Yes  Toileting and hygiene offered: Yes  Sitter present: no Law enforcement present: Yes  

## 2014-08-23 NOTE — ED Notes (Signed)
BEHAVIORAL HEALTH ROUNDING  Patient sleeping: Yes.  Patient alert and oriented: no  Behavior appropriate: Yes. ; If no, describe:  Nutrition and fluids offered: No  Toileting and hygiene offered: No  Sitter present: no  Law enforcement present: Yes   

## 2014-08-23 NOTE — ED Notes (Signed)
ED BHU PLACEMENT JUSTIFICATION  Is the patient under IVC or is there intent for IVC: Yes.  Is the patient medically cleared: Yes.  Is there vacancy in the ED BHU: Yes.  Is the population mix appropriate for patient: Yes.  Is the patient awaiting placement in inpatient or outpatient setting: Yes.  Has the patient had a psychiatric consult: Yes.  Survey of unit performed for contraband, proper placement and condition of furniture, tampering with fixtures in bathroom, shower, and each patient room: Yes. ; Findings: All Clear  APPEARANCE/BEHAVIOR  calm, cooperative and adequate rapport can be established  NEURO ASSESSMENT  Orientation: time, place and person  Hallucinations: No.None noted (Hallucinations)  Speech: Normal  Gait: normal  RESPIRATORY ASSESSMENT  WNL  CARDIOVASCULAR ASSESSMENT  WNL  GASTROINTESTINAL ASSESSMENT  WNL  EXTREMITIES  ROM of all joints is normal  PLAN OF CARE  Provide calm/safe environment. Vital signs assessed twice daily. ED BHU Assessment once each 12-hour shift. Collaborate with intake RN daily or as condition indicates. Assure the ED provider has rounded once each shift. Provide and encourage hygiene. Provide redirection as needed. Assess for escalating behavior; address immediately and inform ED provider.  Assess family dynamic and appropriateness for visitation as needed: Yes. ; If necessary, describe findings:  Educate the patient/family about BHU procedures/visitation: Yes. ; If necessary, describe findings: PT CALM AND COOPERATIVE AT THIS TIME. UNDERSTANDING AND ACCEPTING OF UNIT PROCEDURES AND RULES.WILL CONTINUE TO MONITOR.   

## 2014-08-23 NOTE — ED Notes (Signed)
Pt again noted to be hitting on the wall and messing with the door handle and frame. Will medicated pt with his PRN ativan.

## 2014-08-23 NOTE — ED Notes (Signed)

## 2014-08-23 NOTE — ED Provider Notes (Signed)
-----------------------------------------   2:40 AM on 08/23/2014 -----------------------------------------   BP 100/44 mmHg  Pulse 67  Temp(Src) 98.3 F (36.8 C) (Oral)  Resp 18  Ht 5\' 8"  (1.727 m)  Wt 150 lb (68.04 kg)  BMI 22.81 kg/m2  SpO2 98%  The patient had no acute events since last update.  Calm and sitting comfortably. A psychiatric nurse did note the patient has had frequent use of the bathroom during the shift, I will add a urine culture as he did have some rare bacteria but otherwise no evidence of acute urinary tract infection.   Disposition is pending per Psychiatry/Behavioral Medicine team recommendations.     Sharyn CreamerMark Lacoya Wilbanks, MD 08/23/14 [phone]

## 2014-08-23 NOTE — Consult Note (Signed)
  Pt seen and re-evlauted. Pt has Autism Spectrum Disorder and Mild MR. Been co-operative  And complaint  with meds. M.S. Alert and oriented. No agitated. Confused at times. Cognition is below average.  No major change in Mental status . Denies active suicidal or homicidal ideas or plans. I/J guarded /impaired and needs supervision help and re-direction with behavior which is  Un- predictable and could be dangerous to self and others. I/j Impaired. Autism Spectrum Disorder and Mild Mental Disabilities. Plan Continue current treatment and re- do IVC papers and wait for placement

## 2014-08-23 NOTE — ED Notes (Signed)
Pt returned all items to tech and returned to room 

## 2014-08-23 NOTE — ED Notes (Signed)
Pt. Up and using bathroom. 

## 2014-08-23 NOTE — ED Notes (Signed)
Pt up to desk asking to have his friends phone number looked up. Explained to pt that it was after lights out and that he needed to lay down. Explained to pt that he would be able to use the phone during phone hours in the morning if he behaved. Pt bac to his room with no resistance.

## 2014-08-23 NOTE — ED Notes (Signed)
BEHAVIORAL HEALTH ROUNDING Patient sleeping: No. Patient alert and oriented: no Behavior appropriate: Yes.  ; If no, describe:  Nutrition and fluids offered: Yes  Toileting and hygiene offered: Yes  Sitter present: no Law enforcement present: Yes  

## 2014-08-23 NOTE — ED Notes (Signed)
Pt unable to keep from standing in his door looking into females room. He has been asked several times shut his door. Pt moved to room 5. Pt unhappy with move, acting aggressive by reaching for my face and techs face, invading personal space. Extra security called. Pt asked to take .5 mg of ativan and return to room #5, which he did. Situation resolved

## 2014-08-23 NOTE — ED Notes (Signed)
Linens changed, trash removed and floor cleaned by tech.

## 2014-08-23 NOTE — ED Notes (Signed)
BEHAVIORAL HEALTH ROUNDING  Patient sleeping: No.  Patient alert and oriented: yes  Behavior appropriate: Yes. ; If no, describe:  Nutrition and fluids offered: Yes  Toileting and hygiene offered: Yes  Sitter present: not applicable  Law enforcement present: Yes ODS  

## 2014-08-23 NOTE — ED Notes (Addendum)
Pt took medication with no resistance. Pt encouraged to lay down and get some rest.

## 2014-08-23 NOTE — ED Notes (Signed)
FSBS 96. Checked due to frequent trips to bathroom.

## 2014-08-23 NOTE — ED Notes (Signed)
Pt up walking around constantly, has been to the bathroom 4 times in the last 5 mins. Pt asked what is going on and pt states he does not want to be here. Explained to pt that he has to be here until a place is found for him to go and that as it is a holiday weekend it will not happen until at least Tuesday. Pt directed to his room.

## 2014-08-23 NOTE — Progress Notes (Signed)
Patient was evaluated by Psych MD, renewed patient's IVC paperwork.  Patient continues to wait for placement. Placement coordinated with patient's care coordinator.    Sammuel Hineseborah Shyne Resch. Theresia MajorsLCSWA, MSW Clinical Social Work Department Emergency Room [phone] 4:44 PM

## 2014-08-23 NOTE — ED Notes (Signed)
Breakfast tray given. °

## 2014-08-23 NOTE — ED Notes (Signed)
Pt noted to be messing with the door handle and frame. Pt hitting on the wall. In to talk with pt. Pt refusing to speak but when asked if he understood that this was unacceptable behavior he nods his head yes.Explained to pt that if he did not calm down and stop hitting the walls he would have to be given medication. Pt nods he understands. Again explained to pt that acceptable behavior would help to get him placed somewhere else sooner.

## 2014-08-23 NOTE — ED Notes (Signed)
Pt. Up using bathroom. 

## 2014-08-23 NOTE — ED Notes (Signed)
BEHAVIORAL HEALTH ROUNDING Patient sleeping: no Patient alert and oriented: yes Behavior appropriate: Yes.  ; If no, describe:  Nutrition and fluids offered: Yes  Toileting and hygiene offered: Yes  Sitter present: no Law enforcement present: Yes  

## 2014-08-23 NOTE — ED Notes (Signed)
Pt given lunch tray.

## 2014-08-23 NOTE — ED Notes (Signed)
Pt initially refused to allow tech to get vitals. Tech gave pt a little time and went back with security and pt allowed tech to take vitals.

## 2014-08-23 NOTE — ED Notes (Signed)
Pt agitated because his room was changed. In room and resting now.

## 2014-08-23 NOTE — ED Notes (Signed)
Pt given supper tray.

## 2014-08-24 NOTE — ED Notes (Signed)
Patient observed lying in bed with eyes closed  Even, unlabored respirations observed   NAD pt appears to be sleeping  I will continue to monitor along with every 15 minute visual observations and ongoing security camera monitoring    

## 2014-08-24 NOTE — ED Notes (Signed)
BEHAVIORAL HEALTH ROUNDING Patient sleeping: Yes.   Patient alert and oriented: not applicable SLEEPING Behavior appropriate: Yes.  ; If no, describe: SLEEPING Nutrition and fluids offered: No SLEEPING Toileting and hygiene offered: NoSLEEPING Sitter present: not applicable Law enforcement present: Yes ODS 

## 2014-08-24 NOTE — ED Notes (Signed)
BEHAVIORAL HEALTH ROUNDING Patient sleeping: No. Patient alert and oriented: yes Behavior appropriate: Yes.  ; If no, describe:  Nutrition and fluids offered: Yes  Toileting and hygiene offered: Yes  Sitter present: no Law enforcement present: Yes, ODS 

## 2014-08-24 NOTE — ED Notes (Signed)
Lunch provided   Patient observed lying in bed with eyes closed  Even, unlabored respirations observed   NAD pt appears to be sleeping  I will continue to monitor along with every 15 minute visual observations and ongoing security camera monitoring    

## 2014-08-24 NOTE — ED Notes (Signed)
[facility]. Pt care assumed. Pt resting on bed watching tv at this time.

## 2014-08-24 NOTE — ED Notes (Addendum)
NAD observed  Pt observed with no unusual behavior  Appropriate to stimulation  No verbalized needs or concerns at this time  NAD assessed  Continue to monitor

## 2014-08-24 NOTE — ED Notes (Signed)
BEHAVIORAL HEALTH ROUNDING Patient sleeping: Yes.   Patient alert and oriented: eyes closed  Appears asleep Behavior appropriate: Yes.  ; If no, describe:  Nutrition and fluids offered: Yes  Toileting and hygiene offered: sleeping Sitter present: q 15 minute observations and security camera monitoring Law enforcement present: yes  ODS 

## 2014-08-24 NOTE — ED Notes (Signed)

## 2014-08-24 NOTE — ED Notes (Signed)
Pt up to bathroom.

## 2014-08-24 NOTE — ED Notes (Signed)
Pt returned to room and back to bed

## 2014-08-24 NOTE — ED Notes (Signed)
Breakfast provided   Patient observed lying in bed with eyes closed  Even, unlabored respirations observed   NAD pt appears to be sleeping  I will continue to monitor along with every 15 minute visual observations and ongoing security camera monitoring    

## 2014-08-24 NOTE — ED Notes (Signed)
BEHAVIORAL HEALTH ROUNDING Patient sleeping: No. Patient alert and oriented: yes Behavior appropriate: Yes.  ; If no, describe:  Nutrition and fluids offered: yes Toileting and hygiene offered: Yes  Sitter present: q15 minute observations and security camera monitoring Law enforcement present: Yes  ODS  

## 2014-08-24 NOTE — ED Notes (Signed)
Pt resting in bed with tv on. No unusual behavior observed. Pt has no needs or concerns at this time. Will continue to monitor and f/u as needed.

## 2014-08-24 NOTE — ED Provider Notes (Signed)
-----------------------------------------   7:04 AM on 08/24/2014 -----------------------------------------   BP 103/60 mmHg  Pulse 75  Temp(Src) 98.6 F (37 C) (Oral)  Resp 18  Ht 5\' 8"  (1.727 m)  Wt 150 lb (68.04 kg)  BMI 22.81 kg/m2  SpO2 98%  The patient had no acute events since last update.  Calm and cooperative at this time.  Disposition is pending per Psychiatry/Behavioral Medicine team recommendations.     Irean HongJade J Sung, MD 08/24/14 [phone]

## 2014-08-24 NOTE — ED Notes (Signed)
Pt awakened for med administration and assessment completion  Pt denies pain  Pt continues to await group home placement

## 2014-08-24 NOTE — ED Notes (Signed)

## 2014-08-24 NOTE — ED Notes (Signed)
ED BHU PLACEMENT JUSTIFICATION Is the patient under IVC or is there intent for IVC: Yes.   Is the patient medically cleared: Yes.   Is there vacancy in the ED BHU: Yes.   Is the population mix appropriate for patient: Yes.   Is the patient awaiting placement in inpatient or outpatient setting: Yes.   Has the patient had a psychiatric consult: Yes.   Survey of unit performed for contraband, proper placement and condition of furniture, tampering with fixtures in bathroom, shower, and each patient room: Yes.  ; Findings:  APPEARANCE/BEHAVIOR cooperative NEURO ASSESSMENT Orientation: AAO x3 Hallucinations: No.None noted (Hallucinations) Speech: Normal Gait: normal RESPIRATORY ASSESSMENT Normal expansion.  Clear to auscultation.  No rales, rhonchi, or wheezing. CARDIOVASCULAR ASSESSMENT Heart tones audible, skin color wnl, warm and dry GASTROINTESTINAL ASSESSMENT wnl EXTREMITIES Moves all extremities  PLAN OF CARE Provide calm/safe environment. Vital signs assessed twice daily. ED BHU Assessment once each 12-hour shift. Collaborate with intake RN daily or as condition indicates. Assure the ED provider has rounded once each shift. Provide and encourage hygiene. Provide redirection as needed. Assess for escalating behavior; address immediately and inform ED provider.  Assess family dynamic and appropriateness for visitation as needed: Yes.  ; If necessary, describe findings:  Educate the patient/family about BHU procedures/visitation: Yes.  ; If necessary, describe findings:

## 2014-08-24 NOTE — ED Notes (Signed)
Pt is standing in the dayroom with the remote in his hand turning the TV Pt observed with no unusual behavior  Appropriate to stimulation  No verbalized needs or concerns at this time  NAD assessed  Continue to monitor

## 2014-08-24 NOTE — ED Notes (Signed)
Supper provided   Pt has eaten and he brought trash from his room to the trashcan   Pt observed with no unusual behavior  Appropriate to stimulation  No verbalized needs or concerns at this time  NAD assessed  Continue to monitor

## 2014-08-24 NOTE — ED Notes (Signed)
Pt provided with sandwich tray and drink.  

## 2014-08-25 MED ORDER — CARBAMAZEPINE 200 MG PO TABS
ORAL_TABLET | ORAL | Status: AC
  Administered 2014-08-25: 800 mg
  Filled 2014-08-25: qty 4

## 2014-08-25 MED ORDER — SERTRALINE HCL 100 MG PO TABS
ORAL_TABLET | ORAL | Status: AC
  Administered 2014-08-25: 100 mg
  Filled 2014-08-25: qty 1

## 2014-08-25 MED ORDER — LORAZEPAM 0.5 MG PO TABS
ORAL_TABLET | ORAL | Status: AC
  Administered 2014-08-25: 0.5 mg via ORAL
  Filled 2014-08-25: qty 1

## 2014-08-25 MED ORDER — SERTRALINE HCL 100 MG PO TABS
ORAL_TABLET | ORAL | Status: AC
  Administered 2014-08-25: 100 mg via ORAL
  Filled 2014-08-25: qty 1

## 2014-08-25 NOTE — ED Notes (Signed)
BEHAVIORAL HEALTH ROUNDING Patient sleeping: Yes.   Patient alert and oriented: yes Behavior appropriate: Yes.  ; If no, describe:  Nutrition and fluids offered: Yes  Toileting and hygiene offered: Yes  Sitter present: yes Law enforcement present: Yes  

## 2014-08-25 NOTE — ED Notes (Signed)
BEHAVIORAL HEALTH ROUNDING Patient sleeping: No. Patient alert and oriented: yes Behavior appropriate: Yes.  ; If no, describe:  Nutrition and fluids offered: Yes  Toileting and hygiene offered: Yes  Sitter present: yes Law enforcement present: Yes  

## 2014-08-25 NOTE — ED Notes (Signed)
BEHAVIORAL HEALTH ROUNDING Patient sleeping: Yes.   Patient alert and oriented: asleep Behavior appropriate: Yes.  ; If no, describe:  Nutrition and fluids offered: asleep Toileting and hygiene offered: asleep Sitter present: no Law enforcement present: yes, ODS 

## 2014-08-25 NOTE — ED Notes (Signed)
ED BHU PLACEMENT JUSTIFICATION Is the patient under IVC or is there intent for IVC: Yes.   Is the patient medically cleared: Yes.   Is there vacancy in the ED BHU: Yes.   Is the population mix appropriate for patient: Yes.   Is the patient awaiting placement in inpatient or outpatient setting: Yes.   Has the patient had a psychiatric consult: Yes.   Survey of unit performed for contraband, proper placement and condition of furniture, tampering with fixtures in bathroom, shower, and each patient room: Yes.  ; Findings:  APPEARANCE/BEHAVIOR calm, cooperative and adequate rapport can be established NEURO ASSESSMENT Orientation: time, place and person Hallucinations: No.None noted (Hallucinations) Speech: Normal Gait: normal RESPIRATORY ASSESSMENT Normal expansion.  Clear to auscultation.  No rales, rhonchi, or wheezing., No chest wall tenderness. CARDIOVASCULAR ASSESSMENT regular rate and rhythm, S1, S2 normal, no murmur, click, rub or gallop GASTROINTESTINAL ASSESSMENT soft, nontender, BS WNL, no r/g EXTREMITIES normal strength, tone, and muscle mass PLAN OF CARE Provide calm/safe environment. Vital signs assessed twice daily. ED BHU Assessment once each 12-hour shift. Collaborate with intake RN daily or as condition indicates. Assure the ED provider has rounded once each shift. Provide and encourage hygiene. Provide redirection as needed. Assess for escalating behavior; address immediately and inform ED provider.  Assess family dynamic and appropriateness for visitation as needed: Yes.  ; If necessary, describe findings:  Educate the patient/family about BHU procedures/visitation: Yes.  ; If necessary, describe findings:  

## 2014-08-25 NOTE — ED Notes (Signed)

## 2014-08-25 NOTE — ED Notes (Signed)
Pt up to bathroom and back to bed.

## 2014-08-25 NOTE — ED Notes (Signed)
BEHAVIORAL HEALTH ROUNDING Patient sleeping: Yes.   Patient alert and oriented: asleep Behavior appropriate: Yes.  ; If no, describe:  Nutrition and fluids offered: asleep Toileting and hygiene offered: asleep Sitter present: no Law enforcement present: Yes, ODS 

## 2014-08-25 NOTE — Consult Note (Signed)
  Psychiatry: Follow-up for this 24 year old man with history of autism and developmental disability. Patient has no new complaints. Behavior has been calm. Mostly isolating. Denies suicidal ideation. Denies hallucinations. On interview is requests are only that if we find him a group home close to things he is interested in.  Social work note reviewed. Apparently there is some plan in place to try and refer him to a care home in [place]. I had spoken today to the intake Child psychotherapistsocial worker [facility] myself. However it now looks like maybe there is a plan in place for some kind of placement. We will see what happens this week. Supportive counseling completed.

## 2014-08-25 NOTE — ED Notes (Signed)
meds given per md order  

## 2014-08-25 NOTE — ED Provider Notes (Signed)
-----------------------------------------   7:37 AM on 08/25/2014 -----------------------------------------   BP 106/62 mmHg  Pulse 95  Temp(Src) 98.8 F (37.1 C) (Oral)  Resp 18  Ht 5\' 8"  (1.727 m)  Wt 150 lb (68.04 kg)  BMI 22.81 kg/m2  SpO2 99%  The patient had no acute events since last update.  Calm and cooperative at this time.  Disposition is pending per Psychiatry/Behavioral Medicine team recommendations.    Arnaldo NatalPaul F Malinda, MD 08/25/14 [phone]

## 2014-08-25 NOTE — Progress Notes (Signed)
CSW called Tammy Rivers, Care Coordinator (CC) with [facility], to inquire about the status of placement for pt.  CC explained that she is still in the process of working on the contract with [facility] [place].  She is hopeful that the process for placement will be completed this week.  CSW will continue to follow and assist with placement.  WhitingLynn Konya Fauble, [place] [phone]

## 2014-08-25 NOTE — ED Notes (Signed)

## 2014-08-25 NOTE — ED Notes (Signed)
BEHAVIORAL HEALTH ROUNDING Patient sleeping: No. Patient alert and oriented: yes Behavior appropriate: Yes.  ; If no, describe:  Nutrition and fluids offered: Yes  Toileting and hygiene offered: Yes  Sitter present: no Law enforcement present: Yes, ODS 

## 2014-08-25 NOTE — ED Notes (Signed)
BEHAVIORAL HEALTH ROUNDING Patient sleeping: No. Patient alert and oriented: yes Behavior appropriate: Yes.  ; If no, describe:  Nutrition and fluids offered: Yes  Toileting and hygiene offered: Yes  Sitter present: no Law enforcement present: Yes  

## 2014-08-25 NOTE — ED Notes (Signed)
Report received from end-shift RN. Patient care assumed. Patient/RN introduction complete. Will continue to monitor.  

## 2014-08-25 NOTE — ED Notes (Signed)
Pt calmly resting in bed with no distress noted, cooperative at this time, without complaints.

## 2014-08-25 NOTE — ED Notes (Signed)
Pt resting in bed with eyes closed. No unusual behavior observed. Pt has no needs or concerns at this time. Will continue to monitor and f/u as needed.  

## 2014-08-25 NOTE — ED Notes (Signed)
PT  IVC (5TH SET) SEEN  BY  DR CLAPACS  ON  08/25/14  PENDING  PLACEMENT

## 2014-08-25 NOTE — ED Notes (Signed)
Snack tray given 

## 2014-08-26 MED ORDER — CARBAMAZEPINE 200 MG PO TABS
ORAL_TABLET | ORAL | Status: AC
  Filled 2014-08-26: qty 4

## 2014-08-26 MED ORDER — SERTRALINE HCL 100 MG PO TABS
ORAL_TABLET | ORAL | Status: AC
  Filled 2014-08-26: qty 1

## 2014-08-26 MED ORDER — SERTRALINE HCL 100 MG PO TABS
ORAL_TABLET | ORAL | Status: AC
  Administered 2014-08-26: 100 mg via ORAL
  Filled 2014-08-26: qty 1

## 2014-08-26 NOTE — ED Notes (Signed)
Patient observed with no unusual behavior or acute distress. Patient with no verbalized needs or c/o at this time.... will continue to monitor and follow up as needed. Security staff monitoring patient on [employer] system.  

## 2014-08-26 NOTE — ED Notes (Signed)
meds administered as ordered  Assessment completed  Pt observed with no unusual behavior  Appropriate to stimulation  No verbalized needs or concerns at this time  NAD assessed  Continue to monitor

## 2014-08-26 NOTE — ED Notes (Signed)
No change in condition will continue to monitor  

## 2014-08-26 NOTE — ED Notes (Signed)
Patient observed lying in bed with eyes closed  Even, unlabored respirations observed   NAD pt appears to be sleeping  I will continue to monitor along with every 15 minute visual observations and ongoing security camera monitoring    

## 2014-08-26 NOTE — ED Notes (Signed)
Pt has been up and ambulated to the BR  NAD observed   Pt with no verbalized needs or concerns at this time  Continue to monitor

## 2014-08-26 NOTE — ED Notes (Signed)
He is currently taking a shower  

## 2014-08-26 NOTE — ED Notes (Signed)
Supper provided   Pt observed lying in bed  Pt observed with no unusual behavior  Appropriate to stimulation  No verbalized needs or concerns at this time  NAD assessed  Continue to monitor

## 2014-08-26 NOTE — ED Notes (Signed)
BEHAVIORAL HEALTH ROUNDING Patient sleeping: Yes.   Patient alert and oriented: not applicable Behavior appropriate: Yes.  ; If no, describe:  Nutrition and fluids offered: Yes  Toileting and hygiene offered: Yes  Sitter present: yes Law enforcement present: Yes  

## 2014-08-26 NOTE — ED Notes (Signed)

## 2014-08-26 NOTE — ED Notes (Signed)
BEHAVIORAL HEALTH ROUNDING Patient sleeping: No. Patient alert and oriented: yes Behavior appropriate: Yes.  ; If no, describe:  Nutrition and fluids offered: yes Toileting and hygiene offered: Yes  Sitter present: q15 minute observations and security camera monitoring Law enforcement present: Yes  ODS  

## 2014-08-26 NOTE — ED Notes (Signed)

## 2014-08-26 NOTE — ED Notes (Signed)
Pt is lying in bed

## 2014-08-26 NOTE — ED Notes (Signed)
Breakfast provided  Pt continues to rest with his eyes closed  NAD observed  Continue to monitor

## 2014-08-26 NOTE — ED Notes (Signed)
Pt ambulating to the BR  Pt observed with no unusual behavior  Appropriate to stimulation  No verbalized needs or concerns at this time  NAD assessed  Continue to monitor

## 2014-08-26 NOTE — ED Notes (Signed)

## 2014-08-26 NOTE — ED Notes (Signed)
Pt walking around the day room

## 2014-08-26 NOTE — ED Notes (Signed)
Sandwich tray and drink given to pt per ED BHU policy.  

## 2014-08-26 NOTE — ED Notes (Signed)
BEHAVIORAL HEALTH ROUNDING Patient sleeping: No. Patient alert and oriented: yes Behavior appropriate: Yes.  ; If no, describe:  Nutrition and fluids offered: Yes  Toileting and hygiene offered: Yes  Sitter present: yes Law enforcement present: Yes  

## 2014-08-26 NOTE — ED Provider Notes (Signed)
-----------------------------------------   10:10 AM on 08/26/2014 -----------------------------------------   BP 106/86 mmHg  Pulse 82  Temp(Src) 99.1 F (37.3 C) (Oral)  Resp 18  Ht 5\' 8"  (1.727 m)  Wt 150 lb (68.04 kg)  BMI 22.81 kg/m2  SpO2 100%  The patient had no acute events since last update.  Calm and cooperative at this time.  Disposition is pending per Psychiatry/Behavioral Medicine team recommendations.     Gayla DossEryka A Scott Fix, MD 08/26/14 [id number]

## 2014-08-26 NOTE — ED Notes (Signed)

## 2014-08-26 NOTE — ED Notes (Signed)

## 2014-08-26 NOTE — Consult Note (Signed)
  Psychiatry: Follow-up for this 24 year old man with autism and mild mental retardation. Patient today once to plead his case for being placed [place]. Tried to offer support and education about the limited options. I don't know if he really understood. He has not had any new behavior problems. He appears to be increasingly sad and frustrated but has not acted out. Plan is continued to work with his case manager on trying to find appropriate placement. Spoke with [facility] as well but it looks like for now they are trying to place him at some sort of group home. No change to medicine.

## 2014-08-26 NOTE — ED Notes (Signed)
Pt up to restroom.

## 2014-08-26 NOTE — ED Notes (Signed)
BEHAVIORAL HEALTH ROUNDING Patient sleeping: Yes.   Patient alert and oriented: eyes closed  Appears asleep Behavior appropriate: Yes.  ; If no, describe:  Nutrition and fluids offered: Yes  Toileting and hygiene offered: sleeping Sitter present: q 15 minute observations and security camera monitoring Law enforcement present: yes  ODS 

## 2014-08-26 NOTE — ED Notes (Signed)
Lunch provided   Patient observed lying in bed with eyes closed  Even, unlabored respirations observed   NAD pt appears to be sleeping  I will continue to monitor along with every 15 minute visual observations and ongoing security camera monitoring    

## 2014-08-27 MED ORDER — SERTRALINE HCL 100 MG PO TABS
ORAL_TABLET | ORAL | Status: AC
  Administered 2014-08-27: 100 mg via ORAL
  Filled 2014-08-27: qty 1

## 2014-08-27 MED ORDER — SERTRALINE HCL 100 MG PO TABS
ORAL_TABLET | ORAL | Status: AC
  Filled 2014-08-27: qty 1

## 2014-08-27 NOTE — ED Notes (Signed)
ED BHU PLACEMENT JUSTIFICATION  Is the patient under IVC or is there intent for IVC: Yes.  Is the patient medically cleared: Yes.  Is there vacancy in the ED BHU: Yes.  Is the population mix appropriate for patient: Yes.  Is the patient awaiting placement in inpatient or outpatient setting: Yes.  Has the patient had a psychiatric consult: Yes.  Survey of unit performed for contraband, proper placement and condition of furniture, tampering with fixtures in bathroom, shower, and each patient room: Yes. APPEARANCE/BEHAVIOR  Calm and cooperative  NEURO ASSESSMENT  Orientation: oriented x3 Denies pain  Hallucinations: No.None noted (Hallucinations)  Speech: Normal  Gait: normal  RESPIRATORY ASSESSMENT  Even Unlabored respirations  CARDIOVASCULAR ASSESSMENT  Pulses equal regular rate Skin warm and dry  GASTROINTESTINAL ASSESSMENT  No GI complaint  EXTREMITIES  Full ROM  PLAN OF CARE  Provide calm/safe environment. Vital signs assessed twice daily. ED BHU Assessment once each 12-hour shift. Collaborate with intake RN daily or as condition indicates. Assure the ED provider has rounded once each shift. Provide and encourage hygiene. Provide redirection as needed. Assess for escalating behavior; address immediately and inform ED provider.  Assess family dynamic and appropriateness for visitation as needed: Yes.  Educate the patient/family about BHU procedures/visitation: Yes.   

## 2014-08-27 NOTE — ED Provider Notes (Signed)
-----------------------------------------   6:24 AM on 08/27/2014 -----------------------------------------   BP 115/63 mmHg  Pulse 101  Temp(Src) 99 F (37.2 C) (Oral)  Resp 17  Ht 5\' 8"  (1.727 m)  Wt 150 lb (68.04 kg)  BMI 22.81 kg/m2  SpO2 100%  The patient had no acute events since last update.  Calm and cooperative at this time.  Disposition is pending per Psychiatry/Behavioral Medicine team recommendations.     Irean HongJade J Liala Codispoti, MD 08/27/14 [phone]

## 2014-08-27 NOTE — ED Notes (Signed)
BEHAVIORAL HEALTH ROUNDING Patient sleeping: Yes.   Patient alert and oriented: sleeping Behavior appropriate: Yes.  ; If no, describe: sleeping Nutrition and fluids offered: sleeping Toileting and hygiene offered: sleeping Sitter present: no Law enforcement present: Yes  and [place] 

## 2014-08-27 NOTE — ED Notes (Signed)
BEHAVIORAL HEALTH ROUNDING Patient sleeping: No. Patient alert and oriented: yes Behavior appropriate: Yes.  ; If no, describe:  Nutrition and fluids offered: yes Toileting and hygiene offered: Yes  Sitter present: q15 minute observations and security camera monitoring Law enforcement present: Yes  ODS  

## 2014-08-27 NOTE — ED Notes (Signed)
BEHAVIORAL HEALTH ROUNDING Patient sleeping: Yes.   Patient alert and oriented: eyes closed  Appears asleep Behavior appropriate: Yes.  ; If no, describe:  Nutrition and fluids offered: Yes  Toileting and hygiene offered: sleeping Sitter present: q 15 minute observations and security camera monitoring Law enforcement present: yes  ODS 

## 2014-08-27 NOTE — ED Notes (Signed)
Breakfast provided  He has been up and ambulated to the BR  Pt observed with no unusual behavior  Appropriate to stimulation  No verbalized needs or concerns at this time  NAD assessed  Continue to monitor

## 2014-08-27 NOTE — Consult Note (Signed)
  Psychiatry: Follow-up for patient with autism and mental retardation. No new complaints or clinical presentations today. Patient is awake alert and interactive. Tolerating medication well. I am told that his care coordinator is coming to visit with the goal of trying to find appropriate placement for him. No change to current acute treatment.

## 2014-08-27 NOTE — ED Notes (Signed)
BEHAVIORAL HEALTH ROUNDING  Patient sleeping: No.  Patient alert and oriented: Yes  Behavior appropriate: Yes. ; If no, describe:  Nutrition and fluids offered: Yes  Toileting and hygiene offered: Yes  Sitter present: q15 minute observations and security camera monitoring  Law enforcement present: Yes ODS 

## 2014-08-27 NOTE — ED Notes (Signed)

## 2014-08-27 NOTE — ED Notes (Signed)
Lunch provided  Pt observed with no unusual behavior  Appropriate to stimulation  No verbalized needs or concerns at this time  NAD assessed  Continue to monitor 

## 2014-08-27 NOTE — ED Notes (Signed)
Pt sitting in treatment room. Pt observed watching television, with no unusual behavior. Appropriate to stimulation. No verbalized needs or concerns at this time. NAD assessed. Will continue to monitor.

## 2014-08-27 NOTE — ED Notes (Signed)

## 2014-08-27 NOTE — ED Notes (Signed)

## 2014-08-27 NOTE — ED Notes (Signed)
Patient observed lying in bed with eyes closed  Even, unlabored respirations observed   NAD pt appears to be sleeping  I will continue to monitor along with every 15 minute visual observations and ongoing security camera monitoring    

## 2014-08-27 NOTE — ED Notes (Signed)
Snack provided by ED Tech. Pt observed with no unusual behavior. Appropriate to stimulation. No verbalized needs or concerns at this time. NAD assessed. Will continue to monitor.  

## 2014-08-27 NOTE — ED Notes (Signed)
Drink and snack provided  Pt observed with no unusual behavior  Appropriate to stimulation  No verbalized needs or concerns at this time  NAD assessed  Continue to monitor

## 2014-08-27 NOTE — ED Notes (Signed)
Pt has completed his shower  Assessment completed  Pt denies pain  ?CSW is still working on placement

## 2014-08-27 NOTE — ED Notes (Signed)
Pt ivc  Pending  Placement  Papers  To  Be  Renewed  08/30/14

## 2014-08-27 NOTE — ED Notes (Signed)
BEHAVIORAL HEALTH ROUNDING Patient sleeping: No. Patient alert and oriented: yes Behavior appropriate: Yes.  ; If no, describe:  Nutrition and fluids offered: No Toileting and hygiene offered: Yes  Sitter present: yes Law enforcement present: Yes  

## 2014-08-27 NOTE — ED Notes (Signed)
Pt observed lying in bed  Pt observed with no unusual behavior  Appropriate to stimulation  No verbalized needs or concerns at this time  NAD assessed  Continue to monitor 

## 2014-08-28 MED ORDER — SERTRALINE HCL 100 MG PO TABS
ORAL_TABLET | ORAL | Status: AC
  Administered 2014-08-28: 100 mg via ORAL
  Filled 2014-08-28: qty 1

## 2014-08-28 MED ORDER — CARBAMAZEPINE 200 MG PO TABS
ORAL_TABLET | ORAL | Status: AC
  Administered 2014-08-28: 800 mg
  Filled 2014-08-28: qty 4

## 2014-08-28 MED ORDER — SERTRALINE HCL 100 MG PO TABS
ORAL_TABLET | ORAL | Status: AC
  Filled 2014-08-28: qty 1

## 2014-08-28 MED ORDER — LEVETIRACETAM 500 MG PO TABS
ORAL_TABLET | ORAL | Status: AC
  Filled 2014-08-28: qty 1

## 2014-08-28 NOTE — ED Notes (Signed)
BEHAVIORAL HEALTH ROUNDING Patient sleeping: No. Patient alert and oriented: yes Behavior appropriate: Yes.  ; If no, describe:  Nutrition and fluids offered: Yes  Toileting and hygiene offered: Yes  Sitter present: yes Law enforcement present: Yes  

## 2014-08-28 NOTE — ED Notes (Signed)
BEHAVIORAL HEALTH ROUNDING  Patient sleeping: Yes  Patient alert and oriented: Sleeping  Behavior appropriate: Yes. ; If no, describe:  Nutrition and fluids offered: Sleeping  Toileting and hygiene offered: Sleeping  Sitter present: q15 minute observations and security camera monitoring  Law enforcement present: Yes ODS  

## 2014-08-28 NOTE — ED Notes (Signed)
Pt walking around the room no distress noted

## 2014-08-28 NOTE — Progress Notes (Signed)
[facility] [phone] for an update on patient.  Left message to call CSW.  [facility] with [facility] [phone].  Left message to call CSW.  CSW will continue to follow up with Care Coordinators for assistance with placement.  Sammuel Hineseborah Belenda Alviar. Theresia MajorsLCSWA, MSW Clinical Social Work Department Emergency Room [phone] 4:09 PM

## 2014-08-28 NOTE — ED Notes (Signed)

## 2014-08-28 NOTE — Consult Note (Signed)
  Psychiatry: Follow-up for this 24 year old man with autism and mental retardation. No change to presentation. No evidence of psychosis no agitated behavior or violence. Supportive counseling completed. Patient is awaiting placement.

## 2014-08-28 NOTE — ED Notes (Signed)
BEHAVIORAL HEALTH ROUNDING Patient sleeping: Yes.   Patient alert and oriented: not applicable Behavior appropriate: Yes.  ; If no, describe:  Nutrition and fluids offered: Yes  Toileting and hygiene offered: Yes  Sitter present: yes Law enforcement present: Yes  

## 2014-08-28 NOTE — ED Provider Notes (Signed)
-----------------------------------------   7:24 AM on 08/28/2014 -----------------------------------------   BP 121/64 mmHg  Pulse 81  Temp(Src) 98.9 F (37.2 C) (Oral)  Resp 16  Ht 5\' 8"  (1.727 m)  Wt 135 lb (61.236 kg)  BMI 20.53 kg/m2  SpO2 99%  The patient had no acute events since last update.  Calm and cooperative at this time.  Disposition is pending per Psychiatry/Behavioral Medicine team recommendations.     Irean HongJade J Sung, MD 08/28/14 [phone]

## 2014-08-28 NOTE — Progress Notes (Signed)
CSW received return call from [facility] [phone].  [facility] continues to work on an appropriate IDD placement for patient.   Care Coordinator completed an assessment on patient yesterday that will assist with getting additional mental health services in place.  [facility] is the home they are currently working with for placement.  Care Coordinator concerned that if the appropriate placement if not obtained patient will return to the ED as he had done the previous two times.  CSW will follow up with Care Coordinator next week.   Sammuel Hineseborah Graycee Greeson. Theresia MajorsLCSWA, MSW Clinical Social Work Department Emergency Room [phone] 4:40 PM

## 2014-08-28 NOTE — Consult Note (Signed)
  Psychiatry: Patient is psychiatrically clear. He has a chronic condition which is unlikely to change from any specific treatment in the near term. Not requiring any further specific treatment that we can provide at this time.

## 2014-08-28 NOTE — ED Notes (Signed)
BEHAVIORAL HEALTH ROUNDING Patient sleeping: Yes.   Patient alert and oriented: not applicable Behavior appropriate: Yes.  ; If no, describe:  Nutrition and fluids offered: No Toileting and hygiene offered: No Sitter present: yes Law enforcement present: Yes   

## 2014-08-28 NOTE — ED Notes (Signed)
Pt given snack tray. 

## 2014-08-28 NOTE — ED Notes (Signed)
meds given per md order  

## 2014-08-28 NOTE — ED Notes (Signed)
PT calm and cooperative at this time laying in the bed, awaiting group home placement, no distress noted.

## 2014-08-28 NOTE — ED Notes (Signed)
BEHAVIORAL HEALTH ROUNDING Patient sleeping: Yes.   Patient alert and oriented: not applicable Behavior appropriate: Yes.  ; If no, describe:  Nutrition and fluids offered: No Toileting and hygiene offered: No Sitter present: yes Law enforcement present: Yes   ENVIRONMENTAL ASSESSMENT Potentially harmful objects out of patient reach: Yes.   Personal belongings secured: Yes.   Patient dressed in hospital provided attire only: Yes.   Plastic bags out of patient reach: Yes.   Patient care equipment (cords, cables, call bells, lines, and drains) shortened, removed, or accounted for: Yes.   Equipment and supplies removed from bottom of stretcher: Yes.   Potentially toxic materials out of patient reach: Yes.   Sharps container removed or out of patient reach: Yes.    

## 2014-08-28 NOTE — ED Notes (Signed)
Report received from end-shift RN. Patient care assumed. Patient/RN introduction complete. Will continue to monitor.  

## 2014-08-28 NOTE — ED Notes (Signed)
Pt taking shower.  

## 2014-08-28 NOTE — ED Notes (Signed)

## 2014-08-28 NOTE — ED Notes (Signed)
ED BHU PLACEMENT JUSTIFICATION Is the patient under IVC or is there intent for IVC: Yes.   Is the patient medically cleared: Yes.   Is there vacancy in the ED BHU: Yes.   Is the population mix appropriate for patient: Yes.   Is the patient awaiting placement in inpatient or outpatient setting: Yes.   Has the patient had a psychiatric consult: Yes.   Survey of unit performed for contraband, proper placement and condition of furniture, tampering with fixtures in bathroom, shower, and each patient room: Yes.  ; Findings:  APPEARANCE/BEHAVIOR calm, cooperative and adequate rapport can be established NEURO ASSESSMENT Orientation: place and person Hallucinations: No.None noted (Hallucinations) Speech: Normal Gait: normal RESPIRATORY ASSESSMENT Normal expansion.  Clear to auscultation.  No rales, rhonchi, or wheezing., No chest wall tenderness. CARDIOVASCULAR ASSESSMENT regular rate and rhythm, S1, S2 normal, no murmur, click, rub or gallop GASTROINTESTINAL ASSESSMENT soft, nontender, BS WNL, no r/g EXTREMITIES normal strength, tone, and muscle mass PLAN OF CARE Provide calm/safe environment. Vital signs assessed twice daily. ED BHU Assessment once each 12-hour shift. Collaborate with intake RN daily or as condition indicates. Assure the ED provider has rounded once each shift. Provide and encourage hygiene. Provide redirection as needed. Assess for escalating behavior; address immediately and inform ED provider.  Assess family dynamic and appropriateness for visitation as needed: Yes.  ; If necessary, describe findings:  Educate the patient/family about BHU procedures/visitation: Yes.  ; If necessary, describe findings:

## 2014-08-29 MED ORDER — CARBAMAZEPINE 200 MG PO TABS
ORAL_TABLET | ORAL | Status: AC
  Administered 2014-08-29: 800 mg
  Filled 2014-08-29: qty 4

## 2014-08-29 MED ORDER — LEVETIRACETAM 500 MG PO TABS
ORAL_TABLET | ORAL | Status: AC
  Filled 2014-08-29: qty 2

## 2014-08-29 MED ORDER — SERTRALINE HCL 50 MG PO TABS
ORAL_TABLET | ORAL | Status: AC
  Administered 2014-08-29: 100 mg via ORAL
  Filled 2014-08-29: qty 2

## 2014-08-29 MED ORDER — CARBAMAZEPINE 200 MG PO TABS
ORAL_TABLET | ORAL | Status: AC
Start: 2014-08-29 — End: 2014-08-29
  Administered 2014-08-29: 800 mg
  Filled 2014-08-29: qty 4

## 2014-08-29 MED ORDER — SERTRALINE HCL 100 MG PO TABS
ORAL_TABLET | ORAL | Status: AC
  Administered 2014-08-29: 100 mg via ORAL
  Filled 2014-08-29: qty 1

## 2014-08-29 NOTE — ED Notes (Signed)

## 2014-08-29 NOTE — ED Notes (Signed)
BEHAVIORAL HEALTH ROUNDING Patient sleeping: No. Patient alert and oriented: yes Behavior appropriate: Yes.  ; If no, describe:   Nutrition and fluids offered: Yes  Toileting and hygiene offered: Yes  Sitter present: no Law enforcement present: Yes  and O[place]  ENVIRONMENTAL ASSESSMENT Potentially harmful objects out of patient reach: Yes.   Personal belongings secured: Yes.   Patient dressed in hospital provided attire only: Yes.   Plastic bags out of patient reach: Yes.   Patient care equipment (cords, cables, call bells, lines, and drains) shortened, removed, or accounted for: Yes.   Equipment and supplies removed from bottom of stretcher: Yes.   Potentially toxic materials out of patient reach: Yes.   Sharps container removed or out of patient reach: Yes.     ED BHU PLACEMENT JUSTIFICATION Is the patient under IVC or is there intent for IVC: Yes.   Is the patient medically cleared: Yes.   Is there vacancy in the ED BHU: Yes.   Is the population mix appropriate for patient: Yes.   Is the patient awaiting placement in inpatient or outpatient setting: Yes.   Has the patient had a psychiatric consult: Yes.   Survey of unit performed for contraband, proper placement and condition of furniture, tampering with fixtures in bathroom, shower, and each patient room: Yes.  ; Findings:   APPEARANCE/BEHAVIOR calm and cooperative NEURO ASSESSMENT Orientation: time, place and person Hallucinations: No.None noted (Hallucinations) Speech: Normal Gait: normal RESPIRATORY ASSESSMENT Normal expansion.  Clear to auscultation.  No rales, rhonchi, or wheezing. CARDIOVASCULAR ASSESSMENT regular rate and rhythm, S1, S2 normal, no murmur, click, rub or gallop GASTROINTESTINAL ASSESSMENT soft, nontender, BS WNL, no r/g EXTREMITIES normal strength, tone, and muscle mass PLAN OF CARE Provide calm/safe environment. Vital signs assessed twice daily. ED BHU Assessment once each 12-hour shift. Collaborate  with intake RN daily or as condition indicates. Assure the ED provider has rounded once each shift. Provide and encourage hygiene. Provide redirection as needed. Assess for escalating behavior; address immediately and inform ED provider.  Assess family dynamic and appropriateness for visitation as needed: No.; If necessary, describe findings:   Educate the patient/family about BHU procedures/visitation: Yes.  ; If necessary, describe findings:

## 2014-08-29 NOTE — ED Notes (Signed)
ED BHU PLACEMENT JUSTIFICATION Is the patient under IVC or is there intent for IVC: Yes.   Is the patient medically cleared: Yes.   Is there vacancy in the ED BHU: Yes.   Is the population mix appropriate for patient: Yes.   Is the patient awaiting placement in inpatient or outpatient setting: Yes.   Has the patient had a psychiatric consult: Yes.   Survey of unit performed for contraband, proper placement and condition of furniture, tampering with fixtures in bathroom, shower, and each patient room: Yes.  ; Findings:  APPEARANCE/BEHAVIOR Calm, cooperative NEURO ASSESSMENT Orientation: place, person Hallucinations: No.None noted (Hallucinations) Speech: Normal Gait: normal RESPIRATORY ASSESSMENT Normal expansion.  Clear to auscultation.  No rales, rhonchi, or wheezing. CARDIOVASCULAR ASSESSMENT regular rate and rhythm, S1, S2 normal, no murmur, click, rub or gallop GASTROINTESTINAL ASSESSMENT soft, nontender, BS WNL, no r/g EXTREMITIES normal strength, tone, and muscle mass PLAN OF CARE Provide calm/safe environment. Vital signs assessed twice daily. ED BHU Assessment once each 12-hour shift. Collaborate with intake RN daily or as condition indicates. Assure the ED provider has rounded once each shift. Provide and encourage hygiene. Provide redirection as needed. Assess for escalating behavior; address immediately and inform ED provider.  Assess family dynamic and appropriateness for visitation as needed: Yes.  ; If necessary, describe findings:  Educate the patient/family about BHU procedures/visitation: Yes.  ; If necessary, describe findings:

## 2014-08-29 NOTE — ED Notes (Signed)
Pt laying in bed.  

## 2014-08-29 NOTE — ED Notes (Signed)
BEHAVIORAL HEALTH ROUNDING Patient sleeping: No. Patient alert and oriented: yes Behavior appropriate: Yes.  ; If no, describe:  Nutrition and fluids offered: Yes  Toileting and hygiene offered: Yes  Sitter present: not applicable Law enforcement present: Yes  

## 2014-08-29 NOTE — ED Notes (Signed)
BEHAVIORAL HEALTH ROUNDING Patient sleeping: Yes.   Patient alert and oriented: not applicable Behavior appropriate: Yes.  ; If no, describe:  Nutrition and fluids offered: No Toileting and hygiene offered: No Sitter present: not applicable Law enforcement present: Yes  

## 2014-08-29 NOTE — ED Provider Notes (Signed)
-----------------------------------------   7:09 AM on 08/29/2014 -----------------------------------------   BP 113/61 mmHg  Pulse 74  Temp(Src) 97.7 F (36.5 C) (Oral)  Resp 18  Ht 5\' 8"  (1.727 m)  Wt 135 lb (61.236 kg)  BMI 20.53 kg/m2  SpO2 99%  The patient had no acute events since last update.  Calm and cooperative at this time.  Disposition is pending per Psychiatry/Behavioral Medicine team recommendations.     Sharman CheekPhillip Gayle Collard, MD 08/29/14 [phone]

## 2014-08-29 NOTE — ED Notes (Addendum)

## 2014-08-29 NOTE — ED Notes (Signed)
BEHAVIORAL HEALTH ROUNDING Patient sleeping: No. Patient alert and oriented: yes Behavior appropriate: Yes.  ; If no, describe:  Nutrition and fluids offered: No Toileting and hygiene offered: Yes  Sitter present: not applicable Law enforcement present: Yes  

## 2014-08-29 NOTE — ED Notes (Signed)
BEHAVIORAL HEALTH ROUNDING Patient sleeping: No. Patient alert and oriented: yes Behavior appropriate: Yes.  ; If no, describe:   Nutrition and fluids offered: Yes  Toileting and hygiene offered: Yes  Sitter present: no Law enforcement present: Yes  and [place]  

## 2014-08-29 NOTE — ED Notes (Addendum)
Pt resting in bed.

## 2014-08-29 NOTE — ED Notes (Signed)
Pt is still IVC pending placement IVC paper need to be renew on 6-516 at 4:o clock

## 2014-08-29 NOTE — ED Notes (Signed)
No change in condition will continue to monitor  

## 2014-08-29 NOTE — ED Notes (Signed)
BEHAVIORAL HEALTH ROUNDING Patient sleeping: Yes.   Patient alert and oriented: yes Behavior appropriate: Yes.  ; If no, describe:  Nutrition and fluids offered: Yes  Toileting and hygiene offered: Yes  Sitter present: not applicable Law enforcement present: Yes  

## 2014-08-29 NOTE — ED Notes (Signed)
BEHAVIORAL HEALTH ROUNDING Patient sleeping: No. Patient alert and oriented: yes Behavior appropriate: Yes.  ; If no, describe:  Nutrition and fluids offered: Yes  Toileting and hygiene offered: Yes  Sitter present: not applicable Law enforcement present: Yes ODS/shift 

## 2014-08-30 MED ORDER — SERTRALINE HCL 100 MG PO TABS
ORAL_TABLET | ORAL | Status: AC
  Administered 2014-08-30: 100 mg via ORAL
  Filled 2014-08-30: qty 1

## 2014-08-30 MED ORDER — LEVETIRACETAM 500 MG PO TABS
ORAL_TABLET | ORAL | Status: AC
  Administered 2014-08-30: 750 mg via ORAL
  Filled 2014-08-30: qty 2

## 2014-08-30 MED ORDER — LORAZEPAM 0.5 MG PO TABS
ORAL_TABLET | ORAL | Status: AC
  Filled 2014-08-30: qty 1

## 2014-08-30 MED ORDER — CARBAMAZEPINE 200 MG PO TABS
ORAL_TABLET | ORAL | Status: AC
  Filled 2014-08-30: qty 4

## 2014-08-30 MED ORDER — SERTRALINE HCL 100 MG PO TABS
ORAL_TABLET | ORAL | Status: AC
  Filled 2014-08-30: qty 1

## 2014-08-30 NOTE — ED Notes (Signed)
BEHAVIORAL HEALTH ROUNDING Patient sleeping: No. Patient alert and oriented: yes Behavior appropriate: Yes.  ; If no, describe:  Nutrition and fluids offered: Yes  Toileting and hygiene offered: Yes  Sitter present: not applicable Law enforcement present: Yes  

## 2014-08-30 NOTE — ED Notes (Signed)
This tech received report from Brie (float tech) 

## 2014-08-30 NOTE — ED Notes (Addendum)
BEHAVIORAL HEALTH ROUNDING Patient sleeping: Yes.   Patient alert and oriented: no Behavior appropriate: Yes.  ; If no, describe:   Nutrition and fluids offered: No Toileting and hygiene offered: No Sitter present: no Law enforcement present: Yes  and O[place] 

## 2014-08-30 NOTE — ED Notes (Signed)
BEHAVIORAL HEALTH ROUNDING  Patient sleeping: No.  Patient alert and oriented: yes  Behavior appropriate: Yes. ; If no, describe:  Nutrition and fluids offered: Yes  Toileting and hygiene offered: Yes  Sitter present: not applicable  Law enforcement present: Yes ODS  

## 2014-08-30 NOTE — Progress Notes (Signed)
Patient's Care Coordinator continues to work on placement without success.   CSW submited another statewide search for possible placement.  Information sent out in Provider Link and [employer].    Sammuel Hineseborah Elihu Milstein. Theresia MajorsLCSWA, MSW Clinical Social Work Department Emergency Room [phone] 5:45 PM

## 2014-08-30 NOTE — ED Notes (Addendum)
Pt to toilet, returned to bed, TV on 

## 2014-08-30 NOTE — ED Notes (Signed)

## 2014-08-30 NOTE — ED Notes (Signed)
ED BHU PLACEMENT JUSTIFICATION  Is the patient under IVC or is there intent for IVC: Yes.  Is the patient medically cleared: Yes.  Is there vacancy in the ED BHU: Yes.  Is the population mix appropriate for patient: Yes.  Is the patient awaiting placement in inpatient or outpatient setting: Yes.  Has the patient had a psychiatric consult: Yes.  Survey of unit performed for contraband, proper placement and condition of furniture, tampering with fixtures in bathroom, shower, and each patient room: Yes. ; Findings: All clear  APPEARANCE/BEHAVIOR  calm, cooperative and adequate rapport can be established  NEURO ASSESSMENT  Orientation: time, place and person  Hallucinations: No.None noted (Hallucinations)  Speech: Normal  Gait: normal  RESPIRATORY ASSESSMENT  WNL  CARDIOVASCULAR ASSESSMENT  WNL  GASTROINTESTINAL ASSESSMENT  WNL  EXTREMITIES  WNL  PLAN OF CARE  Provide calm/safe environment. Vital signs assessed twice daily. ED BHU Assessment once each 12-hour shift. Collaborate with intake RN daily or as condition indicates. Assure the ED provider has rounded once each shift. Provide and encourage hygiene. Provide redirection as needed. Assess for escalating behavior; address immediately and inform ED provider.  Assess family dynamic and appropriateness for visitation as needed: Yes. ; If necessary, describe findings:  Educate the patient/family about BHU procedures/visitation: Yes. ; If necessary, describe findings: Pt is calm and cooperative at this time. Pt understanding and accepting of unit procedures/rules. Will continue to monitor.  

## 2014-08-30 NOTE — ED Notes (Signed)
BEHAVIORAL HEALTH ROUNDING Patient sleeping: No. Patient alert and oriented: yes Behavior appropriate: Yes.  ; If no, describe:   Nutrition and fluids offered: Yes  Toileting and hygiene offered: Yes  Sitter present: no Law enforcement present: Yes  and [place]  

## 2014-08-30 NOTE — ED Notes (Signed)
BEHAVIORAL HEALTH ROUNDING Patient sleeping: Yes.   Patient alert and oriented: no Behavior appropriate: Yes.  ; If no, describe:   Nutrition and fluids offered: No Toileting and hygiene offered: No Sitter present: no Law enforcement present: Yes  and ODS 

## 2014-08-30 NOTE — ED Notes (Signed)
BEHAVIORAL HEALTH ROUNDING Patient sleeping: Yes.   Patient alert and oriented: not applicable Behavior appropriate: Yes.  ; If no, describe:  Nutrition and fluids offered: No Toileting and hygiene offered: No Sitter present: not applicable Law enforcement present: Yes  

## 2014-08-30 NOTE — ED Notes (Addendum)
BEHAVIORAL HEALTH ROUNDING  Patient sleeping: No.  Patient alert and oriented: yes  Behavior appropriate: Yes. ; If no, describe:  Nutrition and fluids offered: Yes  Toileting and hygiene offered: Yes  Sitter present: not applicable  Law enforcement present: Yes ODS  

## 2014-08-30 NOTE — Consult Note (Signed)
  Psychiatry: Follow-up for this 24 year old man with autism and mental retardation. No change to presentation. Pt has been agitated this AM about a Telephone call and had to be re-directed.  Behavior is calm on the floor but could be un -predictable because of his chronic mental illness. . Supportive counseling completed. Patient is awaiting placement. IVC papers done.

## 2014-08-30 NOTE — ED Provider Notes (Signed)
-----------------------------------------   6:39 AM on 08/30/2014 -----------------------------------------   BP 109/53 mmHg  Pulse 67  Temp(Src) 98.7 F (37.1 C) (Oral)  Resp 18  Ht 5\' 8"  (1.727 m)  Wt 135 lb (61.236 kg)  BMI 20.53 kg/m2  SpO2 95%  The patient had no acute events since last update.  Calm and cooperative at this time.  Disposition is pending per Psychiatry/Behavioral Medicine team recommendations.     Brendan ApleyAllison P Lilianna Case, MD 08/30/14 [phone]

## 2014-08-30 NOTE — ED Notes (Signed)
ED BHU PLACEMENT JUSTIFICATION Is the patient under IVC or is there intent for IVC: Yes.   Is the patient medically cleared: Yes.   Is there vacancy in the ED BHU: Yes.   Is the population mix appropriate for patient: Yes.   Is the patient awaiting placement in inpatient or outpatient setting: Yes.   Has the patient had a psychiatric consult: Yes.   Survey of unit performed for contraband, proper placement and condition of furniture, tampering with fixtures in bathroom, shower, and each patient room: Yes.  ; Findings:  APPEARANCE/BEHAVIOR calm, cooperative and adequate rapport can be established NEURO ASSESSMENT Orientation: place and person Hallucinations: No.None noted (Hallucinations) Speech: Normal Gait: normal RESPIRATORY ASSESSMENT Normal expansion.  Clear to auscultation.  No rales, rhonchi, or wheezing. CARDIOVASCULAR ASSESSMENT regular rate and rhythm, S1, S2 normal, no murmur, click, rub or gallop GASTROINTESTINAL ASSESSMENT soft, nontender, BS WNL, no r/g EXTREMITIES normal strength, tone, and muscle mass PLAN OF CARE Provide calm/safe environment. Vital signs assessed twice daily. ED BHU Assessment once each 12-hour shift. Collaborate with intake RN daily or as condition indicates. Assure the ED provider has rounded once each shift. Provide and encourage hygiene. Provide redirection as needed. Assess for escalating behavior; address immediately and inform ED provider.  Assess family dynamic and appropriateness for visitation as needed: Yes.  ; If necessary, describe findings:  Educate the patient/family about BHU procedures/visitation: Yes.  ; If necessary, describe findings:  

## 2014-08-31 MED ORDER — LEVETIRACETAM 500 MG PO TABS
ORAL_TABLET | ORAL | Status: AC
  Filled 2014-08-31: qty 2

## 2014-08-31 MED ORDER — SERTRALINE HCL 100 MG PO TABS
ORAL_TABLET | ORAL | Status: AC
  Filled 2014-08-31: qty 1

## 2014-08-31 MED ORDER — CARBAMAZEPINE 200 MG PO TABS
ORAL_TABLET | ORAL | Status: AC
  Administered 2014-08-31: 800 mg
  Filled 2014-08-31: qty 4

## 2014-08-31 NOTE — ED Notes (Signed)
BEHAVIORAL HEALTH ROUNDING Patient sleeping: No. Patient alert and oriented: yes Behavior appropriate: Yes.  ; If no, describe:  Nutrition and fluids offered: yes Toileting and hygiene offered: Yes  Sitter present: q15 minute observations and security camera monitoring Law enforcement present: Yes  ODS  

## 2014-08-31 NOTE — ED Notes (Signed)
Lunch provided    Appropriate to stimulation  No verbalized needs or concerns at this time  NAD assessed  Continue to monitor 

## 2014-08-31 NOTE — ED Notes (Signed)
BEHAVIORAL HEALTH ROUNDING  Patient sleeping: No.  Patient alert and oriented: yes  Behavior appropriate: Yes. ; If no, describe:  Nutrition and fluids offered: Yes  Toileting and hygiene offered: Yes  Sitter present: not applicable  Law enforcement present: Yes ODS  

## 2014-08-31 NOTE — ED Notes (Signed)

## 2014-08-31 NOTE — ED Notes (Signed)

## 2014-08-31 NOTE — ED Notes (Signed)
BEHAVIORAL HEALTH ROUNDING Patient sleeping: Yes.   Patient alert and oriented: not applicable SLEEPING Behavior appropriate: Yes.  ; If no, describe: SLEEPING Nutrition and fluids offered: No SLEEPING Toileting and hygiene offered: NoSLEEPING Sitter present: not applicable Law enforcement present: Yes ODS 

## 2014-08-31 NOTE — ED Notes (Signed)
Breakfast provided   Patient observed lying in bed with eyes closed  Even, unlabored respirations observed   NAD pt appears to be sleeping  I will continue to monitor along with every 15 minute visual observations and ongoing security camera monitoring    

## 2014-08-31 NOTE — ED Notes (Signed)
BEHAVIORAL HEALTH ROUNDING Patient sleeping: Yes.   Patient alert and oriented: eyes closed  Appears asleep Behavior appropriate: Yes.  ; If no, describe:  Nutrition and fluids offered: Yes  Toileting and hygiene offered: sleeping Sitter present: q 15 minute observations and security camera monitoring Law enforcement present: yes  ODS 

## 2014-08-31 NOTE — ED Notes (Signed)

## 2014-08-31 NOTE — ED Notes (Addendum)
Pt laying in bed watching tv with no complaints, calm and cooperative at this time will continue to monitor.

## 2014-08-31 NOTE — ED Notes (Signed)
Pt observed lying in bed - watching TV   Pt visualized with NAD  No verbalized needs or concerns at this time  Continue to monitor 

## 2014-08-31 NOTE — ED Notes (Signed)
Report received from end-shift RN. Patient care assumed. Patient/RN introduction complete. Will continue to monitor.  

## 2014-08-31 NOTE — ED Notes (Signed)
Pt laying in bed.  

## 2014-08-31 NOTE — ED Notes (Signed)
Pt awakened so I could complete assessment and med administration  Pt denies pain  Appropriate to stimulation  No verbalized needs or concerns at this time  NAD assessed  Continue to monitor

## 2014-08-31 NOTE — ED Notes (Signed)
Patient observed lying in bed with eyes closed  Even, unlabored respirations observed   NAD pt appears to be sleeping  I will continue to monitor along with every 15 minute visual observations and ongoing security camera monitoring    

## 2014-08-31 NOTE — ED Notes (Signed)
supper provided   Appropriate to stimulation  No verbalized needs or concerns at this time  NAD assessed  Continue to monitor

## 2014-08-31 NOTE — ED Notes (Signed)

## 2014-08-31 NOTE — ED Notes (Addendum)
BEHAVIORAL HEALTH ROUNDING Patient sleeping: Yes.   Patient alert and oriented: eyes closed  Appears asleep Behavior appropriate: Yes.  ; If no, describe:  Nutrition and fluids offered: Yes  Toileting and hygiene offered: sleeping Sitter present: q 15 minute observations and security camera monitoring Law enforcement present: yes  ODS 

## 2014-08-31 NOTE — ED Notes (Signed)
meds given per md order, meal tray given 

## 2014-08-31 NOTE — ED Notes (Signed)

## 2014-08-31 NOTE — ED Notes (Signed)
Pt is up and ambulating in the dayroom  Pt stating  "I like the blonde girl with the doctor -+ she is pretty."     Appropriate to stimulation  No verbalized needs or concerns at this time  NAD assessed  Continue to monitor

## 2014-08-31 NOTE — ED Notes (Signed)
Pt is currently in the shower.

## 2014-08-31 NOTE — ED Notes (Signed)
Status remains the same /All paperwork current & on chart

## 2014-08-31 NOTE — ED Provider Notes (Signed)
-----------------------------------------   6:38 AM on 08/31/2014 -----------------------------------------   BP 112/69 mmHg  Pulse 78  Temp(Src) 98.7 F (37.1 C) (Oral)  Resp 18  Ht 5\' 8"  (1.727 m)  Wt 135 lb (61.236 kg)  BMI 20.53 kg/m2  SpO2 100%  The patient had no acute events since last update.  Calm and cooperative at this time.  Disposition is pending per Psychiatry/Behavioral Medicine team recommendations.     Sharman CheekPhillip Ashelynn Marks, MD 08/31/14 [phone]

## 2014-09-01 MED ORDER — SERTRALINE HCL 100 MG PO TABS
ORAL_TABLET | ORAL | Status: AC
  Administered 2014-09-01: 100 mg via ORAL
  Filled 2014-09-01: qty 1

## 2014-09-01 NOTE — ED Notes (Signed)

## 2014-09-01 NOTE — ED Notes (Signed)

## 2014-09-01 NOTE — ED Notes (Signed)
No change in condition will continue to monitor  

## 2014-09-01 NOTE — ED Notes (Signed)
BEHAVIORAL HEALTH ROUNDING Patient sleeping: Yes.   Patient alert and oriented: not applicable Behavior appropriate: Yes.  ; If no, describe:  Nutrition and fluids offered: No Toileting and hygiene offered: No Sitter present: yes Law enforcement present: Yes   

## 2014-09-01 NOTE — ED Notes (Signed)
BEHAVIORAL HEALTH ROUNDING Patient sleeping: Yes.   Patient alert and oriented: not applicable Behavior appropriate: Yes.  ; If no, describe:  Nutrition and fluids offered: No Toileting and hygiene offered: No Sitter present: yes Law enforcement present: Yes   ENVIRONMENTAL ASSESSMENT Potentially harmful objects out of patient reach: Yes.   Personal belongings secured: Yes.   Patient dressed in hospital provided attire only: Yes.   Plastic bags out of patient reach: Yes.   Patient care equipment (cords, cables, call bells, lines, and drains) shortened, removed, or accounted for: Yes.   Equipment and supplies removed from bottom of stretcher: Yes.   Potentially toxic materials out of patient reach: Yes.   Sharps container removed or out of patient reach: Yes.    

## 2014-09-01 NOTE — ED Provider Notes (Signed)
-----------------------------------------   1:40 PM on 09/01/2014 -----------------------------------------   BP 94/60 mmHg  Pulse 72  Temp(Src) 98.1 F (36.7 C) (Oral)  Resp 18  Ht 5\' 8"  (1.727 m)  Wt 135 lb (61.236 kg)  BMI 20.53 kg/m2  SpO2 98%  The patient had no acute events since last update.  Calm and cooperative at this time.  Disposition is pending per Psychiatry/Behavioral Medicine team recommendations. Psychiatry continues state wide search for appropriate placement, so far without success.     Minna AntisKevin Tahnee Cifuentes, MD 09/01/14 1341

## 2014-09-01 NOTE — ED Notes (Signed)
Pt laying in bed.  

## 2014-09-01 NOTE — ED Notes (Signed)
IVC/Pending placement/All paper work on chart 

## 2014-09-01 NOTE — ED Notes (Signed)
ENVIRONMENTAL ASSESSMENT Potentially harmful objects out of patient reach: Yes.   Personal belongings secured: Yes.   Patient dressed in hospital provided attire only: Yes.   Plastic bags out of patient reach: Yes.   Patient care equipment (cords, cables, call bells, lines, and drains) shortened, removed, or accounted for: Yes.   Equipment and supplies removed from bottom of stretcher: Yes.   Potentially toxic materials out of patient reach: Yes.   Sharps container removed or out of patient reach: Yes.    BEHAVIORAL HEALTH ROUNDING Patient sleeping: No. Patient alert and oriented: yes Behavior appropriate: Yes.   Nutrition and fluids offered: Yes  Toileting and hygiene offered: Yes  Sitter present: q15 min observations and security camera monitoring Law enforcement present: Yes [employer]  ED BHU PLACEMENT JUSTIFICATION Is the patient under IVC or is there intent for IVC: Yes.    Is IVC current? Yes Is the patient medically cleared: Yes.   Is there vacancy in the ED BHU: Yes.   Is the population mix appropriate for patient: Yes.   Is the patient awaiting placement in inpatient or outpatient setting: Yes.   Has the patient had a psychiatric consult: Yes.   Survey of unit performed for contraband, proper placement and condition of furniture, tampering with fixtures in bathroom, shower, and each patient room: Yes.  ; Findings: none APPEARANCE/BEHAVIOR Cooperative,calm NEURO ASSESSMENT Orientation: person Hallucinations: none noted at this time Speech: Normal Gait: normal RESPIRATORY ASSESSMENT Breathing Pattern-regular, no respiratory distress noted CARDIOVASCULAR ASSESSMENT Skin color appropriate for age and race GASTROINTESTINAL ASSESSMENT no GI distress noted EXTREMITIES Moves all extremities, no distress noted PLAN OF CARE Provide calm/safe environment. Vital signs assessed twice daily. ED BHU Assessment once each 12-hour shift. Collaborate with intake RN daily or as  condition indicates. Assure the ED provider has rounded once each shift. Provide and encourage hygiene. Provide redirection as needed. Assess for escalating behavior; address immediately and inform ED provider.  Assess family dynamic and appropriateness for visitation as needed: Yes.  Educate the patient/family about BHU procedures/visitation: Yes.  

## 2014-09-01 NOTE — Consult Note (Signed)
  Psychiatry: No change to treatment plan or diagnosis. No change to behavior. Continues to be psychiatrically stable. Case discussed with social work. Case manager is evidently still working on trying to find a living situation.

## 2014-09-01 NOTE — ED Notes (Signed)
BEHAVIORAL HEALTH ROUNDING Patient sleeping: No. Patient alert and oriented: yes Behavior appropriate: Yes.   Nutrition and fluids offered: Yes  Toileting and hygiene offered: Yes  Sitter present: q15 min observations and security camera monitoring Law enforcement present: Yes [employer] 

## 2014-09-01 NOTE — ED Notes (Signed)
BEHAVIORAL HEALTH ROUNDING Patient sleeping: No. Patient alert and oriented: yes Behavior appropriate: Yes.  ; If no, describe:  Nutrition and fluids offered: Yes  Toileting and hygiene offered: Yes  Sitter present: yes Law enforcement present: Yes  

## 2014-09-02 MED ORDER — SERTRALINE HCL 100 MG PO TABS
ORAL_TABLET | ORAL | Status: AC
  Administered 2014-09-02: 100 mg via ORAL
  Filled 2014-09-02: qty 1

## 2014-09-02 NOTE — ED Notes (Signed)
Snack tray given. Tolerated well. Patient now states he is going to bed. WIll continue to monitor.

## 2014-09-02 NOTE — ED Notes (Signed)
BEHAVIORAL HEALTH ROUNDING Patient sleeping: Yes.   Patient alert and oriented: not applicable Behavior appropriate: Yes.    Nutrition and fluids offered: No Toileting and hygiene offered: No Sitter present: q15 minute observations and security camera monitoring Law enforcement present: Yes [employer] 

## 2014-09-02 NOTE — ED Notes (Signed)
BEHAVIORAL HEALTH ROUNDING Patient sleeping: No. Patient alert and oriented: yes Behavior appropriate: Yes.  ; If no, describe:   Nutrition and fluids offered: { No  Toileting and hygiene offered: {yes Sitter present: {YES Law enforcement present: {YES

## 2014-09-02 NOTE — ED Notes (Signed)
BEHAVIORAL HEALTH ROUNDING Patient sleeping: No. Patient alert and oriented: not applicable Behavior appropriate: Yes.  ; If no, describe:  Nutrition and fluids offered: Yes  Toileting and hygiene offered: Yes  Sitter present: no Law enforcement present: Yes  

## 2014-09-02 NOTE — ED Notes (Signed)
Patient ambulatory to the bathroom.

## 2014-09-02 NOTE — ED Notes (Signed)
BEHAVIORAL HEALTH ROUNDING Patient sleeping: No. Patient alert and oriented: yes Behavior appropriate: Yes.  ;  Nutrition and fluids offered: {YES  Toileting and hygiene offered: {YES  Sitter present: {YES  Law enforcement present: {YES

## 2014-09-02 NOTE — ED Notes (Signed)

## 2014-09-02 NOTE — ED Notes (Signed)
BEHAVIORAL HEALTH ROUNDING Patient sleeping: Yes.   Patient alert and oriented: no Behavior appropriate: Yes.  ; If no, describe:  Nutrition and fluids offered: Yes  Toileting and hygiene offered: Yes  Sitter present: no Law enforcement present: Yes  

## 2014-09-02 NOTE — ED Notes (Signed)
BEHAVIORAL HEALTH ROUNDING Patient sleeping: No. Patient alert and oriented: no Behavior appropriate: Yes.  ; If no, describe:  Nutrition and fluids offered: Yes  Toileting and hygiene offered: Yes  Sitter present: no Law enforcement present: Yes  

## 2014-09-02 NOTE — ED Notes (Signed)
BEHAVIORAL HEALTH ROUNDING Patient sleeping: No. Patient alert and oriented: yes Behavior appropriate: Yes.   Nutrition and fluids offered: Yes  Toileting and hygiene offered: Yes  Sitter present: q15 min observations and security camera monitoring Law enforcement present: Yes [employer] 

## 2014-09-02 NOTE — Progress Notes (Signed)
LCSW called Adeline Williams cardinal care coordinator to follow up and discuss housing and placement options.

## 2014-09-02 NOTE — ED Notes (Signed)
BEHAVIORAL HEALTH ROUNDING Patient sleeping: No. Patient alert and oriented: unable to assess Behavior appropriate: Yes.  ; If no, describe:  Nutrition and fluids offered: Yes  Toileting and hygiene offered: Yes  Sitter present: no Law enforcement present: Yes

## 2014-09-02 NOTE — ED Notes (Signed)
Patient tolerated po medications well. Would "like a nurse to come play with him." Reoriented to the unit and that it was bedtime. He understood.

## 2014-09-02 NOTE — ED Provider Notes (Signed)
-----------------------------------------   8:07 AM on 09/02/2014 -----------------------------------------   BP 117/66 mmHg  Pulse 72  Temp(Src) 97.5 F (36.4 C) (Oral)  Resp 18  Ht 5\' 8"  (1.727 m)  Wt 135 lb (61.236 kg)  BMI 20.53 kg/m2  SpO2 92%  The patient had no acute events since last update.  Calm and cooperative at this time.  Disposition is pending per Psychiatry/Behavioral Medicine team recommendations.     Myrna Blazeravid Matthew Duha Abair, MD 09/02/14 [id number]

## 2014-09-02 NOTE — ED Notes (Signed)
Patient watching television. Calm and cooperative. Denies need for anything this time.

## 2014-09-02 NOTE — ED Notes (Signed)
IVC/Pending placement/All paper work on chart 

## 2014-09-02 NOTE — ED Notes (Signed)
BEHAVIORAL HEALTH ROUNDING Patient sleeping: No. Patient alert and oriented: no Behavior appropriate: No.; If no, describe: being aggressive toward staff Nutrition and fluids offered: Yes  Toileting and hygiene offered: Yes  Sitter present: no Law enforcement present: Yes

## 2014-09-02 NOTE — Consult Note (Signed)
  Psychiatry: Patient continues to be psychiatrically stable and clear. No new complaints. No new behavior problems. Tolerating medicine well. I am frequently updated by social work that some kind of plan is in the works about getting him placed. I can only hope that it comes through sooner rather than later.

## 2014-09-03 MED ORDER — SERTRALINE HCL 100 MG PO TABS
ORAL_TABLET | ORAL | Status: AC
  Administered 2014-09-03: 100 mg via ORAL
  Filled 2014-09-03: qty 1

## 2014-09-03 NOTE — ED Notes (Signed)
BEHAVIORAL HEALTH ROUNDING Patient sleeping: No. Patient alert and oriented: yes Behavior appropriate: Yes.  ;  Nutrition and fluids offered: No Toileting and hygiene offered: Yes  Sitter present: yes Law enforcement present: Yes

## 2014-09-03 NOTE — ED Notes (Signed)
Lunch provided  Pt observed with no unusual behavior  Appropriate to stimulation  No verbalized needs or concerns at this time  NAD assessed  Continue to monitor 

## 2014-09-03 NOTE — ED Notes (Signed)
Pt observed with no unusual behavior  Appropriate to stimulation  No verbalized needs or concerns at this time  NAD assessed  Continue to monitor 

## 2014-09-03 NOTE — ED Notes (Signed)
Patient observed lying in bed with eyes closed  Even, unlabored respirations observed   NAD pt appears to be sleeping  I will continue to monitor along with every 15 minute visual observations and ongoing security camera monitoring    

## 2014-09-03 NOTE — Progress Notes (Signed)
Adeline Raechel Chute Coordinator returned my call and stated I need to follow up with Ms Rivers/LCSW called and left message.  LCSW received a call back from [facility]: They are now working to have all supports in place for pt once he leaves the hospital.  They have made a follow up appointment with [facility] in  [place] Mom has to change over DSS-sign off on their plan Tomie China to speak and obtain full approval from mother  PSR/Community support will need to be coordinated from [facility] The group home will take him once plan and external supports are in place.   When LCSW asked for Friday or Monday of next week we were told they had no firm date of release for patient. They will call and advise [place].   LCSW offered to make calls/coordinate to speed up the process. [facility] Tammy Rivers and [place] declined our help.

## 2014-09-03 NOTE — ED Notes (Signed)
BEHAVIORAL HEALTH ROUNDING Patient sleeping: No. Patient alert and oriented: yes Behavior appropriate: Yes.  ; If no, describe:  Nutrition and fluids offered: yes Toileting and hygiene offered: Yes  Sitter present: q15 minute observations and security camera monitoring Law enforcement present: Yes  ODS  

## 2014-09-03 NOTE — ED Notes (Signed)
Pt ambulatory to the BR

## 2014-09-03 NOTE — ED Notes (Signed)
Breakfast provided  Appropriate to stimulation  No verbalized needs or concerns at this time  NAD assessed  Continue to monitor 

## 2014-09-03 NOTE — ED Notes (Signed)
Patient watching television in his room. Will continue to monitor.

## 2014-09-03 NOTE — ED Notes (Signed)
BEHAVIORAL HEALTH ROUNDING Patient sleeping: Yes.   Patient alert and oriented: not applicable Behavior appropriate: Yes.   Nutrition and fluids offered: No  Toileting and hygiene offered: No Sitter present: yes Law enforcement present: Yes  

## 2014-09-03 NOTE — ED Notes (Signed)
Pt observed sitting in the dayroom watching TV Pt observed with no unusual behavior  Appropriate to stimulation  No verbalized needs or concerns at this time  NAD assessed  Continue to monitor

## 2014-09-03 NOTE — ED Notes (Signed)

## 2014-09-03 NOTE — ED Notes (Signed)
BEHAVIORAL HEALTH ROUNDING Patient sleeping: Yes.   Patient alert and oriented: eyes closed  Appears asleep Behavior appropriate: Yes.  ; If no, describe:  Nutrition and fluids offered: Yes  Toileting and hygiene offered: sleeping Sitter present: q 15 minute observations and security camera monitoring Law enforcement present: yes  ODS 

## 2014-09-03 NOTE — ED Notes (Signed)
BEHAVIORAL HEALTH ROUNDING Patient sleeping: No. Patient alert and oriented: no Behavior appropriate: Yes.  ;  Nutrition and fluids offered: No Toileting and hygiene offered: No Sitter present: yes Law enforcement present: Yes

## 2014-09-03 NOTE — ED Notes (Signed)
Pt appears to be in good spirits despite the fact that he has been here for over 900 hours  - pt talks on the phone but has not had a visit from anyone that I am aware of since he has been here   I have not seen CSW talk with him concerning his placement and I have not seen any updates on how his placement is progressing   Is anything getting done - or has CSW just given up - More than 700 hours ago - his guardian - his mother should have been made to get involved in the care of her child - a CSW and a sheriff should visit her  This pt is gonna continue to decline  - no one should be confined to this area with no sunlight for this great length of time   Everyone involved in his care should be looking for options

## 2014-09-03 NOTE — Progress Notes (Signed)
LCSW spoke to [facility]- She reported that patient may have a potential placement at [facility] in [place], LCSW advised to contact Tomie China [facility]- [phone] to see if funding has been approved for residential placement and for an update and additional information. Called and left message awaiting a call back.

## 2014-09-03 NOTE — ED Notes (Signed)
BEHAVIORAL HEALTH ROUNDING Patient sleeping: Yes.   Patient alert and oriented: yes Behavior appropriate: Yes.  ; Nutrition and fluids offered: No Toileting and hygiene offered: Yes  Sitter present: yes Law enforcement present: Yes  

## 2014-09-03 NOTE — ED Notes (Signed)
Supper provided    Appropriate to stimulation  No verbalized needs or concerns at this time  NAD assessed  Continue to monitor

## 2014-09-03 NOTE — Consult Note (Signed)
  Psychiatry: Patient has no new complaints today. Behavior has been calm and cooperative. On interview his mental state appears to be in the same as usual. Not making any threatening statements or acting in an agitated manner. Patient was reassured that we are working on finding some kind of placement for him. No change to treatment.

## 2014-09-03 NOTE — ED Notes (Signed)
BEHAVIORAL HEALTH ROUNDING Patient sleeping: No. Patient alert and oriented: yes Behavior appropriate: Yes.   Nutrition and fluids offered: Yes  Toileting and hygiene offered: Yes  Sitter present: q15 min observations and security camera monitoring Law enforcement present: Yes [employer] 

## 2014-09-03 NOTE — ED Notes (Signed)
Ambulatory to the bathroom. Wants to stand in dayroom and watch the nurses station. Instructed to go to his room, which he does.

## 2014-09-03 NOTE — ED Notes (Signed)
Patient napping. TV off for rest.

## 2014-09-03 NOTE — ED Provider Notes (Signed)
-----------------------------------------   6:57 AM on 09/03/2014 -----------------------------------------   BP 106/65 mmHg  Pulse 69  Temp(Src) 98.2 F (36.8 C) (Oral)  Resp 20  Ht 5\' 8"  (1.727 m)  Wt 135 lb (61.236 kg)  BMI 20.53 kg/m2  SpO2 99%  The patient had no acute events since last update.  Calm and cooperative at this time.  Disposition is pending per Psychiatry/Behavioral Medicine team recommendations.     Irean Hong, MD 09/03/14 [phone]

## 2014-09-03 NOTE — ED Notes (Signed)

## 2014-09-03 NOTE — ED Notes (Addendum)
Pt returned to unit from lower level courtyard outing approved by Dr Toni Amend. Pt seemed a bit hesitant when informed that arrangements had been made for him to go to an outside area for some fresh air and sunshine. While outside pt accompanied by [employer] Officer Dixon, Dayshift RN Amy T., [employer] Officer and myself. While outside pt sat on bench and talked about movies, when he was in school, [employer] outside of [place] that he attended and Media planner. Pt transported to and from courtyard in wheelchair.  Pt now alternating between walking around in dayroom and sitting in chair watching tv after having returned to unit. Pt stated "I liked that" when asked what he thought about going outside.  Pt returned to unit at 2010.

## 2014-09-03 NOTE — ED Notes (Signed)
BEHAVIORAL HEALTH ROUNDING Patient sleeping: No. Patient alert and oriented: yes Behavior appropriate: Yes.   Nutrition and fluids offered: NA  Toileting and hygiene offered: na  Sitter present: pt outside in courtyard with [employer] officer Dixon,Dayshift RN Amy T, [employer] officer and myslef. Law enforcement present: Yes [employer]

## 2014-09-04 MED ORDER — TETANUS-DIPHTH-ACELL PERTUSSIS 5-2.5-18.5 LF-MCG/0.5 IM SUSP
INTRAMUSCULAR | Status: AC
  Filled 2014-09-04: qty 0.5

## 2014-09-04 MED ORDER — LEVETIRACETAM 500 MG PO TABS
ORAL_TABLET | ORAL | Status: AC
  Administered 2014-09-04: 750 mg via ORAL
  Filled 2014-09-04: qty 2

## 2014-09-04 MED ORDER — CARBAMAZEPINE 200 MG PO TABS
ORAL_TABLET | ORAL | Status: AC
  Filled 2014-09-04: qty 4

## 2014-09-04 MED ORDER — SERTRALINE HCL 100 MG PO TABS
ORAL_TABLET | ORAL | Status: AC
  Administered 2014-09-04: 100 mg via ORAL
  Filled 2014-09-04: qty 1

## 2014-09-04 NOTE — ED Notes (Signed)
BEHAVIORAL HEALTH ROUNDING Patient sleeping: No. Patient alert and oriented: yes Behavior appropriate: Yes.  ; If no, describe:  Nutrition and fluids offered: Yes  Toileting and hygiene offered: Yes  Sitter present: no Law enforcement present: Yes  

## 2014-09-04 NOTE — ED Notes (Signed)
BEHAVIORAL HEALTH ROUNDING Patient sleeping: Yes.   Patient alert and oriented: not applicable Behavior appropriate: Yes.  ; If no, describe:  Nutrition and fluids offered: Yes  Toileting and hygiene offered: Yes  Sitter present: no Law enforcement present: Yes  

## 2014-09-04 NOTE — ED Provider Notes (Signed)
-----------------------------------------   7:44 AM on 09/04/2014 -----------------------------------------   BP 100/50 mmHg  Pulse 71  Temp(Src) 98.5 F (36.9 C) (Oral)  Resp 18  Ht 5\' 8"  (1.727 m)  Wt 135 lb (61.236 kg)  BMI 20.53 kg/m2  SpO2 97%  The patient had no acute events since last update.  Calm and resting at this time.  Disposition is pending per Psychiatry/Behavioral Medicine team recommendations.     Sharyn Creamer, MD 09/04/14 [phone]

## 2014-09-04 NOTE — Progress Notes (Signed)
CSW received a copy of patient's Person Centered Plan from patient's care coordinator Livia Snellen [phone].  Per Care Coordinator she is getting closer to getting patient out of ED.  Request Psych MD to review and sign service orders to assist with putting the needed community supportive services in place for patient when discharged to group home.  Form reviewed and signed by Psych MD.   CSW faxed form back to care coordinator.  care coordinator Adeline Mayford Knife will follow up with CSW once she has additional information.    CSW will continue to follow patient to assist with disposition.  Sammuel Hines. Theresia Majors, MSW Clinical Social Work Department Emergency Room [phone] 4:29 PM

## 2014-09-04 NOTE — ED Notes (Signed)

## 2014-09-04 NOTE — ED Notes (Signed)
BEHAVIORAL HEALTH ROUNDING Patient sleeping: No   Patient alert and oriented: Yes Behavior appropriate: Yes.    Nutrition and fluids offered: Yes Toileting and hygiene offered: Yes Sitter present: q15 minute observations and security camera monitoring Law enforcement present: Yes [employer]  

## 2014-09-04 NOTE — ED Notes (Signed)
BEHAVIORAL HEALTH ROUNDING Patient sleeping: Yes.   Patient alert and oriented: not applicable Behavior appropriate: Yes.    Nutrition and fluids offered: No Toileting and hygiene offered: No Sitter present: q15 minute observations and security camera monitoring Law enforcement present: Yes [employer] 

## 2014-09-04 NOTE — ED Notes (Signed)

## 2014-09-04 NOTE — ED Notes (Signed)
Pt. Used bathroom.

## 2014-09-04 NOTE — Progress Notes (Signed)
Call to [facility] care coordinator [phone]) for an update on patient, the team is working to have all support services in place for patient when he leaves the hospital to go to his new group home in [place].  Patient has a medical appointment scheduled.  They are currently working on setting up Psychosocial Rehabilitation [facility]) and CFT services.  Care Coordinator is coordinating with patient's mother as the guardian to participate in doing her part in working with the team.  Care Coordinator will call CSW on Monday with an update.    CSW will continue to follow patient for ongoing and discharge needs  Sammuel Hines. Theresia Majors, MSW Clinical Social Work Department Emergency Room [phone] 1:44 PM

## 2014-09-05 MED ORDER — SERTRALINE HCL 100 MG PO TABS
ORAL_TABLET | ORAL | Status: AC
  Filled 2014-09-05: qty 1

## 2014-09-05 NOTE — ED Notes (Signed)
BEHAVIORAL HEALTH ROUNDING Patient sleeping: No. Patient alert and oriented: yes Behavior appropriate: Yes.  ; If no, describe:  Nutrition and fluids offered: yes Toileting and hygiene offered: Yes  Sitter present: q15 minute observations and security camera monitoring Law enforcement present: Yes  ODS  

## 2014-09-05 NOTE — ED Notes (Signed)
Pt. Up using bathroom. 

## 2014-09-05 NOTE — ED Notes (Signed)
Brendan Gamble is still pending placement.His Paper needed to be renew on 09-06-14 @19 :00

## 2014-09-05 NOTE — ED Notes (Signed)
BEHAVIORAL HEALTH ROUNDING Patient sleeping: Yes.   Patient alert and oriented: not applicable Behavior appropriate: not applicable ; If no, describe: Asleep Nutrition and fluids offered: Asleep Toileting and hygiene offered: Asleep Sitter present: yes Law enforcement present: Yes   ENVIRONMENTAL ASSESSMENT Potentially harmful objects out of patient reach: Yes.   Personal belongings secured: Yes.   Patient dressed in hospital provided attire only: Yes.   Plastic bags out of patient reach: Yes.   Patient care equipment (cords, cables, call bells, lines, and drains) shortened, removed, or accounted for: Yes.   Equipment and supplies removed from bottom of stretcher: Yes.   Potentially toxic materials out of patient reach: Yes.   Sharps container removed or out of patient reach: Yes.

## 2014-09-05 NOTE — ED Notes (Signed)
BEHAVIORAL HEALTH ROUNDING Patient sleeping: Yes.   Patient alert and oriented: no Behavior appropriate: Yes.  ; If no, describe:  Nutrition and fluids offered: Yes  Toileting and hygiene offered: Yes  Sitter present: no Law enforcement present: Yes  

## 2014-09-05 NOTE — ED Notes (Signed)

## 2014-09-05 NOTE — ED Notes (Signed)
Supper provided along with an extra drink  Pt observed with no unusual behavior  Appropriate to stimulation  No verbalized needs or concerns at this time  NAD assessed  Continue to monitor 

## 2014-09-05 NOTE — ED Notes (Signed)
Patient observed lying in bed with eyes closed  Even, unlabored respirations observed   NAD pt appears to be sleeping  I will continue to monitor along with every 15 minute visual observations and ongoing security camera monitoring    

## 2014-09-05 NOTE — ED Notes (Signed)
BEHAVIORAL HEALTH ROUNDING Patient sleeping: No. Patient alert and oriented: yes Behavior appropriate: Yes.  ; If no, describe:  Nutrition and fluids offered: Yes  Toileting and hygiene offered: Yes  Sitter present: yes Law enforcement present: Yes  

## 2014-09-05 NOTE — ED Notes (Signed)
Lunch provided along with an extra drink  Pt has sat up in bed   Appropriate to stimulation  No verbalized needs or concerns at this time  NAD assessed  Continue to monitor

## 2014-09-05 NOTE — ED Notes (Signed)
BEHAVIORAL HEALTH ROUNDING  Patient sleeping: Yes.  Patient alert and oriented: no  Behavior appropriate: Yes. ; If no, describe:  Nutrition and fluids offered: No  Toileting and hygiene offered: No  Sitter present: no  Law enforcement present: Yes   

## 2014-09-05 NOTE — ED Notes (Signed)
Sandwich tray and drink provided  Pt observed with no unusual behavior  Appropriate to stimulation  No verbalized needs or concerns at this time  NAD assessed  Continue to monitor

## 2014-09-05 NOTE — ED Notes (Signed)

## 2014-09-05 NOTE — ED Notes (Signed)
Patient observed lying in bed with the TV on   Even, unlabored respirations observed   NAD   I will continue to monitor along with every 15 minute visual observations and ongoing security camera monitoring

## 2014-09-06 MED ORDER — SERTRALINE HCL 100 MG PO TABS
ORAL_TABLET | ORAL | Status: AC
  Administered 2014-09-06: 100 mg via ORAL
  Filled 2014-09-06: qty 1

## 2014-09-06 MED ORDER — LEVETIRACETAM 500 MG PO TABS
ORAL_TABLET | ORAL | Status: AC
  Administered 2014-09-06: 750 mg via ORAL
  Filled 2014-09-06: qty 2

## 2014-09-06 MED ORDER — SERTRALINE HCL 100 MG PO TABS
ORAL_TABLET | ORAL | Status: AC
Start: 2014-09-06 — End: 2014-09-06
  Administered 2014-09-06: 100 mg via ORAL
  Filled 2014-09-06: qty 1

## 2014-09-06 NOTE — ED Notes (Signed)
ED BHU PLACEMENT JUSTIFICATION  Is the patient under IVC or is there intent for IVC: Yes.  Is the patient medically cleared: Yes.  Is there vacancy in the ED BHU: Yes.  Is the population mix appropriate for patient: Yes.  Is the patient awaiting placement in inpatient or outpatient setting: Yes.  Has the patient had a psychiatric consult: Yes.  Survey of unit performed for contraband, proper placement and condition of furniture, tampering with fixtures in bathroom, shower, and each patient room: Yes. ; Findings: All clear  APPEARANCE/BEHAVIOR  calm, cooperative and adequate rapport can be established  NEURO ASSESSMENT  Orientation: time, place and person  Hallucinations: No.None noted (Hallucinations)  Speech: Normal  Gait: normal  RESPIRATORY ASSESSMENT  WNL  CARDIOVASCULAR ASSESSMENT  WNL  GASTROINTESTINAL ASSESSMENT  WNL  EXTREMITIES  WNL  PLAN OF CARE  Provide calm/safe environment. Vital signs assessed twice daily. ED BHU Assessment once each 12-hour shift. Collaborate with intake RN daily or as condition indicates. Assure the ED provider has rounded once each shift. Provide and encourage hygiene. Provide redirection as needed. Assess for escalating behavior; address immediately and inform ED provider.  Assess family dynamic and appropriateness for visitation as needed: Yes. ; If necessary, describe findings:  Educate the patient/family about BHU procedures/visitation: Yes. ; If necessary, describe findings: Pt is calm and cooperative at this time. Pt understanding and accepting of unit procedures/rules. Will continue to monitor.  

## 2014-09-06 NOTE — ED Notes (Signed)
Pt informed time for lights out in dayroom. Pt understanding and cooperative. Went to room with no complaints. Has been calm and cooperative this evening. Will continue to monitor.

## 2014-09-06 NOTE — ED Notes (Signed)
BEHAVIORAL HEALTH ROUNDING Patient sleeping: No. Patient alert and oriented: yes Behavior appropriate: Yes.  ; If no, describe:  Nutrition and fluids offered: Yes  Toileting and hygiene offered: Yes  Sitter present: not applicable Law enforcement present: Yes  

## 2014-09-06 NOTE — ED Notes (Signed)
BEHAVIORAL HEALTH ROUNDING Patient sleeping: No. Patient alert and oriented: no Behavior appropriate: Yes.  ; If no, describe:  Nutrition and fluids offered: Yes  Toileting and hygiene offered: Yes  Sitter present: no Law enforcement present: Yes  

## 2014-09-06 NOTE — ED Notes (Signed)
BEHAVIORAL HEALTH ROUNDING Patient sleeping: Yes.   Patient alert and oriented: not applicable Behavior appropriate: Yes.  ; If no, describe:  Nutrition and fluids offered: No Toileting and hygiene offered: No Sitter present: not applicable Law enforcement present: Yes  

## 2014-09-06 NOTE — ED Provider Notes (Signed)
-----------------------------------------   7:14 AM on 09/06/2014 -----------------------------------------   BP 107/60 mmHg  Pulse 76  Temp(Src) 98.8 F (37.1 C) (Oral)  Resp 16  Ht 5\' 8"  (1.727 m)  Wt 135 lb (61.236 kg)  BMI 20.53 kg/m2  SpO2 94%  Of note patient will break 1000 hours in the emergency department during my shift today. No acute events overnight. Vitals reviewed. Patient remains medically cleared.  Disposition is dependent per psychiatric team   Jene Every, MD 09/06/14 [phone]

## 2014-09-06 NOTE — ED Notes (Signed)

## 2014-09-06 NOTE — ED Notes (Signed)
BEHAVIORAL HEALTH ROUNDING  Patient sleeping: Yes.  Patient alert and oriented: no  Behavior appropriate: Yes. ; If no, describe:  Nutrition and fluids offered: No  Toileting and hygiene offered: No  Sitter present: no  Law enforcement present: Yes   

## 2014-09-06 NOTE — ED Notes (Signed)
BEHAVIORAL HEALTH ROUNDING Patient sleeping: No. Patient alert and oriented: yes Behavior appropriate: Yes.  ; If no, describe:  Nutrition and fluids offered: Yes  Toileting and hygiene offered: Yes  Sitter present: not applicable Law enforcement present: Yes ODS/shift 

## 2014-09-06 NOTE — Consult Note (Signed)
  Psychiatry:  Pt seen in BHU. Patient has no new complaints today. Behavior has been calm and cooperative. On interview his mental state appears to be in the same as usual. Not making any threatening statements or acting in an agitated manner. Patient was reassured that we are working on finding some kind of placement for him. No change to treatment. Pt's behavior is unpredictable and does need higher level of care until he is stable. IVC papers done today.

## 2014-09-06 NOTE — ED Notes (Signed)
BEHAVIORAL HEALTH ROUNDING  Patient sleeping: No.  Patient alert and oriented: yes  Behavior appropriate: Yes. ; If no, describe:  Nutrition and fluids offered: Yes  Toileting and hygiene offered: Yes  Sitter present: not applicable  Law enforcement present: Yes [employer]  Pt awake, sitting in day room interacting appropriately with other patient.

## 2014-09-06 NOTE — ED Notes (Signed)
Pt. Up and using the bathroom. 

## 2014-09-06 NOTE — ED Notes (Signed)
Pt noted to be up to the bathroom.

## 2014-09-06 NOTE — ED Notes (Signed)
ED BHU PLACEMENT JUSTIFICATION Is the patient under IVC or is there intent for IVC: Yes.   Is the patient medically cleared: Yes.   Is there vacancy in the ED BHU: Yes.   Is the population mix appropriate for patient: Yes.   Is the patient awaiting placement in inpatient or outpatient setting: Yes.   Has the patient had a psychiatric consult: Yes.   Survey of unit performed for contraband, proper placement and condition of furniture, tampering with fixtures in bathroom, shower, and each patient room: Yes.  ; Findings:  APPEARANCE/BEHAVIOR calm, cooperative and adequate rapport can be established NEURO ASSESSMENT Orientation: place and person Hallucinations: No.None noted (Hallucinations) Speech: Normal Gait: normal RESPIRATORY ASSESSMENT Normal expansion.  Clear to auscultation.  No rales, rhonchi, or wheezing. CARDIOVASCULAR ASSESSMENT regular rate and rhythm, S1, S2 normal, no murmur, click, rub or gallop GASTROINTESTINAL ASSESSMENT soft, nontender, BS WNL, no r/g EXTREMITIES normal strength, tone, and muscle mass PLAN OF CARE Provide calm/safe environment. Vital signs assessed twice daily. ED BHU Assessment once each 12-hour shift. Collaborate with intake RN daily or as condition indicates. Assure the ED provider has rounded once each shift. Provide and encourage hygiene. Provide redirection as needed. Assess for escalating behavior; address immediately and inform ED provider.  Assess family dynamic and appropriateness for visitation as needed: Yes.  ; If necessary, describe findings:  Educate the patient/family about BHU procedures/visitation: Yes.  ; If necessary, describe findings:  

## 2014-09-06 NOTE — ED Notes (Signed)
Pt. Up using bathroom. 

## 2014-09-06 NOTE — ED Notes (Signed)
BEHAVIORAL HEALTH ROUNDING  Patient sleeping: No.  Patient alert and oriented: yes  Behavior appropriate: Yes. ; If no, describe:  Nutrition and fluids offered: Yes  Toileting and hygiene offered: Yes  Sitter present: not applicable  Law enforcement present: Yes ODS  

## 2014-09-07 MED ORDER — SERTRALINE HCL 100 MG PO TABS
ORAL_TABLET | ORAL | Status: AC
  Administered 2014-09-07: 100 mg via ORAL
  Filled 2014-09-07: qty 1

## 2014-09-07 MED ORDER — CARBAMAZEPINE 200 MG PO TABS
ORAL_TABLET | ORAL | Status: AC
  Administered 2014-09-07: 800 mg
  Filled 2014-09-07: qty 4

## 2014-09-07 MED ORDER — SERTRALINE HCL 100 MG PO TABS
ORAL_TABLET | ORAL | Status: AC
  Administered 2014-09-07: 100 mg
  Filled 2014-09-07: qty 1

## 2014-09-07 MED ORDER — LEVETIRACETAM 500 MG PO TABS
ORAL_TABLET | ORAL | Status: AC
  Filled 2014-09-07: qty 2

## 2014-09-07 MED ORDER — LORAZEPAM 0.5 MG PO TABS
ORAL_TABLET | ORAL | Status: AC
  Administered 2014-09-07: 0.5 mg via ORAL
  Filled 2014-09-07: qty 1

## 2014-09-07 NOTE — ED Notes (Signed)
BEHAVIORAL HEALTH ROUNDING Patient sleeping: Yes.   Patient alert and oriented: yes Behavior appropriate: Yes.  ; If no, describe:  Nutrition and fluids offered: Yes  Toileting and hygiene offered: Yes  Sitter present: no Law enforcement present: Yes  

## 2014-09-07 NOTE — ED Notes (Signed)
Pt given breakfast tray

## 2014-09-07 NOTE — ED Notes (Signed)
Pt offered shower. Pt will not respond or answer tech. Pt shuts his eyes when asked a question.

## 2014-09-07 NOTE — Progress Notes (Signed)
CSW discussed updated Person Centered Plan with Psych MD and obtained signature for service orders.  Form faxed back to care coordinator and copy of plan placed on patient's chart.  Care coordinator will follow up with CSW once she receives approval   Sammuel Hines. Theresia Majors, MSW Clinical Social Work Department Emergency Room [phone] 11:01 AM

## 2014-09-07 NOTE — ED Notes (Signed)
BEHAVIORAL HEALTH ROUNDING  Patient sleeping: No.  Patient alert and oriented: yes  Behavior appropriate: Yes. ; If no, describe:  Nutrition and fluids offered: Yes  Toileting and hygiene offered: Yes  Sitter present: not applicable  Law enforcement present: Yes ODS  

## 2014-09-07 NOTE — ED Notes (Signed)
Meds given per md order, pt eating snack tray

## 2014-09-07 NOTE — ED Notes (Signed)
BEHAVIORAL HEALTH ROUNDING Patient sleeping: No. Patient alert and oriented: yes Behavior appropriate: No.; If no, describe: see note Nutrition and fluids offered: Yes  Toileting and hygiene offered: Yes  Sitter present: no Law enforcement present: Yes  

## 2014-09-07 NOTE — ED Notes (Signed)
BEHAVIORAL HEALTH ROUNDING Patient sleeping: Yes.   Patient alert and oriented: not applicable SLEEPING Behavior appropriate: Yes.  ; If no, describe: SLEEPING Nutrition and fluids offered: No SLEEPING Toileting and hygiene offered: NoSLEEPING Sitter present: not applicable Law enforcement present: Yes ODS 

## 2014-09-07 NOTE — Progress Notes (Signed)
Call from [facility] Babette Relic [phone] informed CSW that the Person Centered Plan signed by Psych MD on Friday needs to be resigned.  Patient was denied waiver services to receive CAP services.    The focus of the plan is for patient to receive residential support with supervised living.   The new plan will be submitted to their UM Department for review and approval today.  Once approved patient will go to new group home. Coordinator unable to inform CSW how long this process will take, however states they are getting closer.     Fax received and reviewed.  CSW will review plan with Psych MD and fax back to Care Coordinator.   CSW will continue to assist with patient's disposition.   Sammuel Hines. Theresia Majors, MSW Clinical Social Work Department Emergency Room [phone] 9:53 AM

## 2014-09-07 NOTE — ED Notes (Signed)
Pt laying in bed will continue to monitor
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
## 2014-09-07 NOTE — ED Notes (Signed)
ED BHU PLACEMENT JUSTIFICATION Is the patient under IVC or is there intent for IVC: Yes.   Is the patient medically cleared: Yes.   Is there vacancy in the ED BHU: Yes.   Is the population mix appropriate for patient: Yes.   Is the patient awaiting placement in inpatient or outpatient setting: Yes.   Has the patient had a psychiatric consult: Yes.   Survey of unit performed for contraband, proper placement and condition of furniture, tampering with fixtures in bathroom, shower, and each patient room: Yes.  ; Findings:  APPEARANCE/BEHAVIOR calm, cooperative and adequate rapport can be established NEURO ASSESSMENT Orientation: place Hallucinations: No.None noted (Hallucinations) Speech: Normal Gait: normal RESPIRATORY ASSESSMENT Normal expansion.  Clear to auscultation.  No rales, rhonchi, or wheezing. CARDIOVASCULAR ASSESSMENT regular rate and rhythm, S1, S2 normal, no murmur, click, rub or gallop GASTROINTESTINAL ASSESSMENT soft, nontender, BS WNL, no r/g EXTREMITIES normal strength, tone, and muscle mass PLAN OF CARE Provide calm/safe environment. Vital signs assessed twice daily. ED BHU Assessment once each 12-hour shift. Collaborate with intake RN daily or as condition indicates. Assure the ED provider has rounded once each shift. Provide and encourage hygiene. Provide redirection as needed. Assess for escalating behavior; address immediately and inform ED provider.  Assess family dynamic and appropriateness for visitation as needed: Yes.  ; If necessary, describe findings:  Educate the patient/family about BHU procedures/visitation: Yes.  ; If necessary, describe findings:

## 2014-09-07 NOTE — ED Notes (Signed)
Pt returned to room  

## 2014-09-07 NOTE — ED Notes (Signed)
BEHAVIORAL HEALTH ROUNDING Patient sleeping: No. Patient alert and oriented: yes Behavior appropriate: Yes.  ; If no, describe:  Nutrition and fluids offered: Yes  Toileting and hygiene offered: Yes  Sitter present: no Law enforcement present: Yes  

## 2014-09-07 NOTE — ED Notes (Signed)
Pt becoming increasingly agitated about finding someone to call on phone. Unable to redirect him. Multiple attempts to calm and redirect. Lorazepam .5 given.

## 2014-09-07 NOTE — ED Notes (Signed)
BEHAVIORAL HEALTH ROUNDING Patient sleeping: No. Patient alert and oriented: yes Behavior appropriate: No.; If no, describe: see note Nutrition and fluids offered: Yes  Toileting and hygiene offered: Yes  Sitter present: no Law enforcement present: Yes

## 2014-09-07 NOTE — ED Notes (Signed)
Pt laying in bed at this time with no distress noted, will continue to monitor.

## 2014-09-07 NOTE — ED Notes (Addendum)

## 2014-09-07 NOTE — ED Notes (Signed)

## 2014-09-07 NOTE — ED Notes (Signed)
BEHAVIORAL HEALTH ROUNDING Patient sleeping: Yes.   Patient alert and oriented: not applicable Behavior appropriate: Yes.  ; If no, describe:  Nutrition and fluids offered: Yes  Toileting and hygiene offered: Yes  Sitter present: yes Law enforcement present: Yes  

## 2014-09-07 NOTE — ED Notes (Signed)
Report received from Bill RN. Patient care assumed. Patient/RN introduction complete. Will continue to monitor.  

## 2014-09-07 NOTE — ED Notes (Signed)

## 2014-09-07 NOTE — ED Notes (Signed)
Pt given telephone  

## 2014-09-08 MED ORDER — SERTRALINE HCL 100 MG PO TABS
ORAL_TABLET | ORAL | Status: AC
  Filled 2014-09-08: qty 1

## 2014-09-08 MED ORDER — SERTRALINE HCL 100 MG PO TABS
ORAL_TABLET | ORAL | Status: AC
  Administered 2014-09-08: 100 mg
  Filled 2014-09-08: qty 1

## 2014-09-08 MED ORDER — CARBAMAZEPINE 200 MG PO TABS
ORAL_TABLET | ORAL | Status: AC
  Filled 2014-09-08: qty 4

## 2014-09-08 NOTE — ED Notes (Signed)
Pt sleeping.  Awoke pt up to ask if he wanted shower.  Pt did not want shower

## 2014-09-08 NOTE — ED Notes (Signed)

## 2014-09-08 NOTE — ED Notes (Signed)
Pt sleeping. 

## 2014-09-08 NOTE — ED Notes (Signed)
BEHAVIORAL HEALTH ROUNDING Patient sleeping: Yes.   Patient alert and oriented: eyes closed  Appears asleep Behavior appropriate: Yes.  ; If no, describe:  Nutrition and fluids offered: Yes  Toileting and hygiene offered: sleeping Sitter present: q 15 minute observations and security camera monitoring Law enforcement present: yes  ODS 

## 2014-09-08 NOTE — ED Notes (Signed)
Brendan Gamble is currently talking on the telephone   Pt observed with no unusual behavior  Appropriate to stimulation  No verbalized needs or concerns at this time  NAD assessed  Continue to monitor

## 2014-09-08 NOTE — Progress Notes (Signed)
LCSW called  Brendan Gamble [phone] Care coordinater and was informed that they are still working on it and there is no change since Monday June 13th,16

## 2014-09-08 NOTE — ED Notes (Signed)
Pt handed out breakfast tray and had vitals check

## 2014-09-08 NOTE — ED Notes (Signed)
Patient observed lying in bed with eyes closed  Even, unlabored respirations observed   NAD pt appears to be sleeping  I will continue to monitor along with every 15 minute visual observations and ongoing security camera monitoring     Am meds to be administered once he awakens

## 2014-09-08 NOTE — ED Notes (Signed)
BEHAVIORAL HEALTH ROUNDING Patient sleeping: No. Patient alert and oriented: yes Behavior appropriate: Yes.  ; If no, describe:  Nutrition and fluids offered: yes Toileting and hygiene offered: Yes  Sitter present: q15 minute observations and security camera monitoring Law enforcement present: Yes  ODS  

## 2014-09-08 NOTE — ED Notes (Signed)
Pt sitting in dayroom watching tv, pt calm and cooperative at this time without complaints.  Pt awaiting group home placement.

## 2014-09-08 NOTE — ED Notes (Signed)
Med given per md order

## 2014-09-08 NOTE — ED Notes (Signed)
Pt laying in bed watching tv  

## 2014-09-08 NOTE — ED Notes (Signed)
Patient observed lying in bed with eyes closed  Even, unlabored respirations observed   NAD pt appears to be sleeping  I will continue to monitor along with every 15 minute visual observations and ongoing security camera monitoring    

## 2014-09-08 NOTE — ED Notes (Signed)

## 2014-09-08 NOTE — ED Notes (Signed)

## 2014-09-08 NOTE — ED Notes (Signed)
Markey is currently sitting in the dayroom talking with another pt and watching TV    Appropriate to stimulation  No verbalized needs or concerns at this time  NAD assessed  Continue to monitor

## 2014-09-08 NOTE — ED Notes (Signed)

## 2014-09-08 NOTE — ED Notes (Signed)
Pt up to bathroom.

## 2014-09-08 NOTE — ED Notes (Signed)
ED BHU PLACEMENT JUSTIFICATION Is the patient under IVC or is there intent for IVC: Yes.   Is the patient medically cleared: Yes.   Is there vacancy in the ED BHU: Yes.   Is the population mix appropriate for patient: Yes.   Is the patient awaiting placement in inpatient or outpatient setting: Yes.   Has the patient had a psychiatric consult: Yes.   Survey of unit performed for contraband, proper placement and condition of furniture, tampering with fixtures in bathroom, shower, and each patient room: Yes.  ; Findings:  APPEARANCE/BEHAVIOR calm, cooperative and adequate rapport can be established NEURO ASSESSMENT Orientation: place and person Hallucinations: No.None noted (Hallucinations) Speech: Normal Gait: normal RESPIRATORY ASSESSMENT Normal expansion.  Clear to auscultation.  No rales, rhonchi, or wheezing. CARDIOVASCULAR ASSESSMENT regular rate and rhythm, S1, S2 normal, no murmur, click, rub or gallop GASTROINTESTINAL ASSESSMENT soft, nontender, BS WNL, no r/g EXTREMITIES normal strength, tone, and muscle mass PLAN OF CARE Provide calm/safe environment. Vital signs assessed twice daily. ED BHU Assessment once each 12-hour shift. Collaborate with intake RN daily or as condition indicates. Assure the ED provider has rounded once each shift. Provide and encourage hygiene. Provide redirection as needed. Assess for escalating behavior; address immediately and inform ED provider.  Assess family dynamic and appropriateness for visitation as needed: Yes.  ; If necessary, describe findings:  Educate the patient/family about BHU procedures/visitation: Yes.  ; If necessary, describe findings:  

## 2014-09-08 NOTE — ED Notes (Signed)
Breakfast provided  Vital signs taken by tech  Appropriate to stimulation  No verbalized needs or concerns at this time  NAD assessed  Continue to monitor

## 2014-09-08 NOTE — ED Notes (Signed)

## 2014-09-08 NOTE — ED Notes (Signed)
No change in condition will continue to monitor  

## 2014-09-08 NOTE — ED Notes (Signed)
Report received from Amy RN. Patient care assumed. Patient/RN introduction complete. Will continue to monitor.  

## 2014-09-08 NOTE — ED Notes (Signed)
Supper provided along with an extra drink    Appropriate to stimulation  No verbalized needs or concerns at this time  NAD assessed  Continue to monitor 

## 2014-09-08 NOTE — ED Provider Notes (Signed)
-----------------------------------------   11:37 AM on 09/08/2014 -----------------------------------------   BP 102/66 mmHg  Pulse 74  Temp(Src) 98.1 F (36.7 C) (Oral)  Resp 18  Ht 5\' 8"  (1.727 m)  Wt 135 lb (61.236 kg)  BMI 20.53 kg/m2  SpO2 98%  The patient had no acute events since last update.  Calm and cooperative at this time.  Disposition is pending per Psychiatry/Behavioral Medicine team recommendations.     Myrna Blazer, MD 09/08/14 [phone]

## 2014-09-08 NOTE — ED Notes (Signed)
Pt laying in bed.  

## 2014-09-08 NOTE — ED Notes (Signed)
Pt given snack tray. 

## 2014-09-09 MED ORDER — CARBAMAZEPINE 200 MG PO TABS
ORAL_TABLET | ORAL | Status: AC
  Administered 2014-09-09: 800 mg
  Filled 2014-09-09: qty 4

## 2014-09-09 MED ORDER — LEVETIRACETAM 500 MG PO TABS
ORAL_TABLET | ORAL | Status: AC
  Administered 2014-09-09: 750 mg via ORAL
  Filled 2014-09-09: qty 2

## 2014-09-09 MED ORDER — SERTRALINE HCL 100 MG PO TABS
ORAL_TABLET | ORAL | Status: AC
  Administered 2014-09-09: 100 mg via ORAL
  Filled 2014-09-09: qty 1

## 2014-09-09 MED ORDER — SERTRALINE HCL 100 MG PO TABS
ORAL_TABLET | ORAL | Status: AC
  Filled 2014-09-09: qty 1

## 2014-09-09 NOTE — ED Notes (Signed)
BEHAVIORAL HEALTH ROUNDING Patient sleeping: No. Patient alert and oriented: yes Behavior appropriate: Yes.  ; If no, describe:  Nutrition and fluids offered: Yes  Toileting and hygiene offered: Yes  Sitter present: yes Law enforcement present: Yes  

## 2014-09-09 NOTE — ED Notes (Addendum)
BEHAVIORAL HEALTH ROUNDING Patient sleeping: No. Patient alert and oriented: yes Behavior appropriate: Yes.  ; If no, describe:  Nutrition and fluids offered: Yes  Toileting and hygiene offered: Yes  Sitter present: yes Law enforcement present: Yes  

## 2014-09-09 NOTE — ED Notes (Addendum)

## 2014-09-09 NOTE — ED Notes (Signed)
Patient observed lying in bed with eyes closed  Even, unlabored respirations observed   NAD pt appears to be sleeping  I will continue to monitor along with every 15 minute visual observations and ongoing security camera monitoring    

## 2014-09-09 NOTE — ED Provider Notes (Signed)
-----------------------------------------   6:55 AM on 09/09/2014 -----------------------------------------   BP 133/68 mmHg  Pulse 65  Temp(Src) 98.8 F (37.1 C) (Oral)  Resp 18  Ht 5\' 8"  (1.727 m)  Wt 135 lb (61.236 kg)  BMI 20.53 kg/m2  SpO2 100%  The patient had no acute events since last update.  Calm and cooperative at this time.  Disposition is pending per Psychiatry/Behavioral Medicine team recommendations.     Rebecka Apley, MD 09/09/14 [phone]

## 2014-09-09 NOTE — ED Notes (Signed)
Pt up to bathroom.

## 2014-09-09 NOTE — ED Notes (Signed)
Supper provided along with an extra drink    Appropriate to stimulation  No verbalized needs or concerns at this time  NAD assessed  Continue to monitor 

## 2014-09-09 NOTE — ED Notes (Signed)
Meal given to Pt.

## 2014-09-09 NOTE — ED Notes (Signed)
Breakfast provided    Pt lying in bed    Appropriate to stimulation  No verbalized needs or concerns at this time  NAD assessed  Continue to monitor

## 2014-09-09 NOTE — ED Notes (Signed)

## 2014-09-09 NOTE — ED Notes (Signed)
BEHAVIORAL HEALTH ROUNDING Patient sleeping: No. Patient alert and oriented: yes Behavior appropriate: Yes.  ; If no, describe:  Nutrition and fluids offered: yes Toileting and hygiene offered: Yes  Sitter present: q15 minute observations and security camera monitoring Law enforcement present: Yes  ODS  

## 2014-09-09 NOTE — ED Notes (Signed)
Pt laying in bed.  

## 2014-09-09 NOTE — ED Notes (Signed)
Pt observed sitting in the dayroom watching TV    Appropriate to stimulation  No verbalized needs or concerns at this time  NAD assessed  Continue to monitor

## 2014-09-09 NOTE — ED Notes (Addendum)
Patient assigned to appropriate care area. Patient oriented to unit/care area: Informed that, for their safety, care areas are designed for safety and monitored by security cameras at all times; and visiting hours explained to patient. Patient verbalizes understanding, and verbal contract for safety obtained. 

## 2014-09-09 NOTE — ED Notes (Signed)
ED BHU PLACEMENT JUSTIFICATION Is the patient under IVC or is there intent for IVC: Yes.   Is the patient medically cleared: Yes.   Is there vacancy in the ED BHU: Yes.   Is the population mix appropriate for patient: Yes.   Is the patient awaiting placement in inpatient or outpatient setting: Yes.   Has the patient had a psychiatric consult: Yes.   Survey of unit performed for contraband, proper placement and condition of furniture, tampering with fixtures in bathroom, shower, and each patient room: Yes.  ; Findings:  APPEARANCE/BEHAVIOR calm, cooperative and adequate rapport can be established NEURO ASSESSMENT Orientation: time, place and person Hallucinations: No.None noted (Hallucinations) Speech: Normal Gait: normal RESPIRATORY ASSESSMENT Normal expansion.  Clear to auscultation.  No rales, rhonchi, or wheezing. CARDIOVASCULAR ASSESSMENT regular rate and rhythm, S1, S2 normal, no murmur, click, rub or gallop GASTROINTESTINAL ASSESSMENT soft, nontender, BS WNL, no r/g EXTREMITIES normal strength, tone, and muscle mass, no deformities, no erythema, induration, or nodules, ROM of all joints is normal, no evidence of joint instability PLAN OF CARE Provide calm/safe environment. Vital signs assessed twice daily. ED BHU Assessment once each 12-hour shift. Collaborate with intake RN daily or as condition indicates. Assure the ED provider has rounded once each shift. Provide and encourage hygiene. Provide redirection as needed. Assess for escalating behavior; address immediately and inform ED provider.  Assess family dynamic and appropriateness for visitation as needed: No.; If necessary, describe findings: Un able to assess family dynamics.  Educate the patient/family about BHU procedures/visitation: Yes.  ; If necessary, describe findings:   

## 2014-09-09 NOTE — ED Notes (Signed)

## 2014-09-09 NOTE — Consult Note (Signed)
  Psychiatry: Follow-up check in with this 24 year old man with a history of autism and chronic intellectual disability. He has no new complaints today. As usual he is requesting to live with "a good family". Patient has not been aggressive or violent or hostile here. Tolerates medicines well. No sign of any side effects. Affect is mostly euthymic and interactive. We continue to rely on case management to please find some kind of living situation for this young man.

## 2014-09-09 NOTE — ED Notes (Signed)

## 2014-09-09 NOTE — ED Notes (Signed)
BEHAVIORAL HEALTH ROUNDING Patient sleeping: Yes.   Patient alert and oriented: eyes closed  Appears asleep Behavior appropriate: Yes.  ; If no, describe:  Nutrition and fluids offered: Yes  Toileting and hygiene offered: sleeping Sitter present: q 15 minute observations and security camera monitoring Law enforcement present: yes  ODS 

## 2014-09-10 LAB — URINE CULTURE

## 2014-09-10 MED ORDER — SERTRALINE HCL 100 MG PO TABS
ORAL_TABLET | ORAL | Status: AC
  Administered 2014-09-10: 100 mg via ORAL
  Filled 2014-09-10: qty 1

## 2014-09-10 MED ORDER — TETANUS-DIPHTH-ACELL PERTUSSIS 5-2.5-18.5 LF-MCG/0.5 IM SUSP
INTRAMUSCULAR | Status: AC
Start: 2014-09-10 — End: 2014-09-10
  Filled 2014-09-10: qty 0.5

## 2014-09-10 MED ORDER — SERTRALINE HCL 100 MG PO TABS
ORAL_TABLET | ORAL | Status: AC
  Filled 2014-09-10: qty 1

## 2014-09-10 MED ORDER — CARBAMAZEPINE 200 MG PO TABS
ORAL_TABLET | ORAL | Status: AC
  Administered 2014-09-10: 800 mg
  Filled 2014-09-10: qty 4

## 2014-09-10 NOTE — ED Notes (Signed)

## 2014-09-10 NOTE — ED Notes (Signed)

## 2014-09-10 NOTE — ED Notes (Signed)
Pt ambulated to the bathroom to void. Pt returned to his room.  

## 2014-09-10 NOTE — BHH Counselor (Signed)
[facility] to visit and talk with the pt. For possible placement. They reported they will follow on tomorrow (09/11/2014).  Group Home is waiting for [employer] to approve the contract and other additional paperwork.   [facility] & Marva [phone])

## 2014-09-10 NOTE — ED Notes (Addendum)
BEHAVIORAL HEALTH ROUNDING Patient sleeping: Yes.   Patient alert and oriented: Pt is sleeping.  Behavior appropriate: Pt is sleeping Nutrition and fluids offered: Pt is sleeping.  Toileting and hygiene offered: Pt is sleeping.  Sitter present: yes Law enforcement present: Yes  

## 2014-09-10 NOTE — ED Provider Notes (Signed)
-----------------------------------------   6:25 AM on 09/10/2014 -----------------------------------------   BP 119/72 mmHg  Pulse 76  Temp(Src) 98.4 F (36.9 C) (Oral)  Resp 16  Ht 5\' 8"  (1.727 m)  Wt 135 lb (61.236 kg)  BMI 20.53 kg/m2  SpO2 99%  The patient had no acute events since last update.  Calm and cooperative at this time.  Disposition is pending per Psychiatry/Behavioral Medicine team recommendations.     Sharman Cheek, MD 09/10/14 [phone]

## 2014-09-10 NOTE — ED Notes (Signed)

## 2014-09-10 NOTE — ED Notes (Signed)
Patient utilizing phone

## 2014-09-10 NOTE — ED Notes (Signed)
Report received from Jean RN. Pt. Alert and oriented in no distress denies SI, HI, AVH and pain.  Pt. Instructed to come to me with problems or concerns.Will continue to monitor for safety via security cameras and Q 15 minute checks. 

## 2014-09-10 NOTE — Consult Note (Signed)
  Psychiatry: Follow-up for this patient with developmental disability and autistic disorder. No new complaints. Behavior calm. Continues to repeat his request that he be placed with a nice family. Psychiatrically stable. No indication to change any treatment plan

## 2014-09-10 NOTE — ED Notes (Signed)
Pt. Noted in room. No complaints or concerns voiced. No distress or abnormal behavior noted. Will continue to monitor with security cameras. Q 15 minute rounds continue. 

## 2014-09-10 NOTE — ED Notes (Signed)
BEHAVIORAL HEALTH ROUNDING Patient sleeping: Yes.   Patient alert and oriented: Pt is sleeping.  Behavior appropriate: Pt is sleeping Nutrition and fluids offered: Pt is sleeping.  Toileting and hygiene offered: Pt is sleeping.  Sitter present: yes Law enforcement present: Yes  

## 2014-09-10 NOTE — ED Notes (Signed)
Pt. Noted in day room. No complaints or concerns voiced. No distress or abnormal behavior noted. Will continue to monitor with security cameras. Q 15 minute rounds continue. 

## 2014-09-10 NOTE — ED Notes (Signed)
Patient utilizing phone 

## 2014-09-10 NOTE — ED Notes (Signed)
ED BHU PLACEMENT JUSTIFICATION Is the patient under IVC or is there intent for IVC: Yes.   Is the patient medically cleared: Yes.   Is there vacancy in the ED BHU: Yes.   Is the population mix appropriate for patient: Yes.   Is the patient awaiting placement in inpatient or outpatient setting: Yes.   Has the patient had a psychiatric consult: Yes.   Survey of unit performed for contraband, proper placement and condition of furniture, tampering with fixtures in bathroom, shower, and each patient room: Yes.  ; Findings: none APPEARANCE/BEHAVIOR calm and cooperative NEURO ASSESSMENT Orientation: place and person Hallucinations: No.None noted (Hallucinations) Speech: Normal Gait: normal RESPIRATORY ASSESSMENT Respirations even and unlabored CARDIOVASCULAR ASSESSMENT Skin warm and dry; Color WNL GASTROINTESTINAL ASSESSMENT No abdominal complaints at this time EXTREMITIES Moves all extremities PLAN OF CARE Provide calm/safe environment. Vital signs assessed twice daily. ED BHU Assessment once each 12-hour shift. Collaborate with intake RN daily or as condition indicates. Assure the ED provider has rounded once each shift. Provide and encourage hygiene. Provide redirection as needed. Assess for escalating behavior; address immediately and inform ED provider.  Assess family dynamic and appropriateness for visitation as needed: Yes.  ; If necessary, describe findings: n/a Educate the patient/family about BHU procedures/visitation: Yes.  ; If necessary, describe findings: n/a

## 2014-09-10 NOTE — ED Notes (Signed)
Pt. Noted in day room. No complaints or concerns voiced. No distress or abnormal behavior noted. Will continue to monitor with security cameras. Q 15 minute rounds continue. Sandwich and soft drink given.

## 2014-09-11 MED ORDER — SERTRALINE HCL 100 MG PO TABS
ORAL_TABLET | ORAL | Status: AC
  Administered 2014-09-11: 100 mg via ORAL
  Filled 2014-09-11: qty 1

## 2014-09-11 MED ORDER — LEVETIRACETAM 500 MG PO TABS
ORAL_TABLET | ORAL | Status: AC
  Filled 2014-09-11: qty 2

## 2014-09-11 MED ORDER — AMMONIA AROMATIC IN INHA
RESPIRATORY_TRACT | Status: AC
  Filled 2014-09-11: qty 10

## 2014-09-11 MED ORDER — CARBAMAZEPINE 200 MG PO TABS
ORAL_TABLET | ORAL | Status: AC
  Administered 2014-09-11: 800 mg
  Filled 2014-09-11: qty 4

## 2014-09-11 NOTE — ED Notes (Signed)
Pt. Noted sleeping in room. No complaints or concerns voiced. No distress or abnormal behavior noted. Will continue to monitor with security cameras. Q 15 minute rounds continue. 

## 2014-09-11 NOTE — ED Notes (Signed)
Sandwich and soft drink given.  

## 2014-09-11 NOTE — ED Notes (Signed)
BEHAVIORAL HEALTH ROUNDING  Patient sleeping: No.  Patient alert and oriented: yes  Behavior appropriate: Yes. ; If no, describe:  Nutrition and fluids offered: Yes  Toileting and hygiene offered: Yes  Sitter present: not applicable  Law enforcement present: Yes ODS  

## 2014-09-11 NOTE — BHH Counselor (Signed)
Called and spoke with, Beckie Salts, [phone]), with [facility]. She stats, she hasn't gotten the final approval from [employer] Department.   Called and spoke [facility], [place] [phone]). She stated, everything has been submitted to their UR department and waiting now waiting on final approval. She had her supervisor seen an e-mail to help expedite the process. UR replied back with, "we are working on it."  Per Care Coordinator, they are hoping it will be approved by today (09/10/16) or no latter than Monday (09/14/2014). As soon as that take place, the Group Home will pick him up.

## 2014-09-11 NOTE — ED Notes (Signed)
Pt. Noted in room. No complaints or concerns voiced. No distress or abnormal behavior noted. Will continue to monitor with security cameras. Q 15 minute rounds continue. 

## 2014-09-11 NOTE — ED Notes (Signed)
Pt up to dayroom sitting in chair, watching TV.

## 2014-09-11 NOTE — ED Notes (Signed)
Pt. Alert and oriented. Denies problems or complaints. 

## 2014-09-11 NOTE — ED Notes (Addendum)
Report received from Gary- RN.

## 2014-09-11 NOTE — ED Notes (Signed)
Pt. Noted in room. sleeping. Will continue to monitor with security cameras. Q 15 minute rounds continue. 

## 2014-09-11 NOTE — ED Notes (Signed)
Pt. Noted in room resting in darkened room.

## 2014-09-11 NOTE — ED Notes (Signed)
ENVIRONMENTAL ASSESSMENT  Potentially harmful objects out of patient reach: Yes.  Personal belongings secured: Yes.  Patient dressed in hospital provided attire only: Yes.  Plastic bags out of patient reach: Yes.  Patient care equipment (cords, cables, call bells, lines, and drains) shortened, removed, or accounted for: Yes.  Equipment and supplies removed from bottom of stretcher: Yes.  Potentially toxic materials out of patient reach: Yes.  Sharps container removed or out of patient reach: Yes.   ED BHU PLACEMENT JUSTIFICATION  Is the patient under IVC or is there intent for IVC: Yes.  Is the patient medically cleared: Yes.  Is there vacancy in the ED BHU: Yes.  Is the population mix appropriate for patient: Yes.  Is the patient awaiting placement in inpatient or outpatient setting: Yes.  Has the patient had a psychiatric consult: Yes.  Survey of unit performed for contraband, proper placement and condition of furniture, tampering with fixtures in bathroom, shower, and each patient room: Yes. ; Findings: All clear  APPEARANCE/BEHAVIOR  calm, cooperative and adequate rapport can be established  NEURO ASSESSMENT  Orientation: time, place and person  Hallucinations: No.None noted (Hallucinations)  Speech: Normal  Gait: normal  RESPIRATORY ASSESSMENT  WNL  CARDIOVASCULAR ASSESSMENT  WNL  GASTROINTESTINAL ASSESSMENT  WNL  EXTREMITIES  WNL  PLAN OF CARE  Provide calm/safe environment. Vital signs assessed twice daily. ED BHU Assessment once each 12-hour shift. Collaborate with intake RN daily or as condition indicates. Assure the ED provider has rounded once each shift. Provide and encourage hygiene. Provide redirection as needed. Assess for escalating behavior; address immediately and inform ED provider.  Assess family dynamic and appropriateness for visitation as needed: Yes. ; If necessary, describe findings:  Educate the patient/family about BHU procedures/visitation: Yes. ; If  necessary, describe findings: Pt is calm and cooperative at this time. Pt understanding and accepting of unit procedures/rules. Will continue to monitor.  

## 2014-09-11 NOTE — ED Provider Notes (Signed)
-----------------------------------------   8:10 AM on 09/11/2014 -----------------------------------------   BP 131/58 mmHg  Pulse 90  Temp(Src) 99.1 F (37.3 C) (Oral)  Resp 18  Ht 5\' 8"  (1.727 m)  Wt 135 lb (61.236 kg)  BMI 20.53 kg/m2  SpO2 92%  The patient had no acute events since last update.  Calm and cooperative at this time.  Patient has been here over 1100 hrs. at this point. Patient remains medically clear and awaiting on psychiatric placement, which is been exceedingly difficult unfortunately.   Minna Antis, MD 09/11/14 [phone]

## 2014-09-11 NOTE — ED Notes (Addendum)
Pt in dayroom, talking to other pt's calm / cooperative

## 2014-09-11 NOTE — ED Notes (Signed)
Breakfast given.  

## 2014-09-11 NOTE — ED Notes (Signed)
IVC/Pending placement/ Custody order to be re-issued on 6/19

## 2014-09-12 MED ORDER — SERTRALINE HCL 100 MG PO TABS
ORAL_TABLET | ORAL | Status: AC
  Administered 2014-09-12: 100 mg via ORAL
  Filled 2014-09-12: qty 1

## 2014-09-12 MED ORDER — LORAZEPAM 0.5 MG PO TABS
ORAL_TABLET | ORAL | Status: AC
  Administered 2014-09-12: 0.5 mg via ORAL
  Filled 2014-09-12: qty 1

## 2014-09-12 MED ORDER — LEVETIRACETAM 500 MG PO TABS
ORAL_TABLET | ORAL | Status: AC
  Filled 2014-09-12: qty 1

## 2014-09-12 NOTE — ED Notes (Signed)

## 2014-09-12 NOTE — ED Notes (Signed)
BEHAVIORAL HEALTH ROUNDING Patient sleeping: Yes.   Patient alert and oriented: eyes closed  Appears asleep Behavior appropriate: Yes.  ; If no, describe:  Nutrition and fluids offered: Yes  Toileting and hygiene offered: sleeping Sitter present: q 15 minute observations and security camera monitoring Law enforcement present: yes  ODS 

## 2014-09-12 NOTE — ED Notes (Addendum)
BEHAVIORAL HEALTH ROUNDING Patient sleeping: Yes.   Patient alert and oriented: not applicable SLEEPING Behavior appropriate: Yes.  ; If no, describe: SLEEPING Nutrition and fluids offered: No SLEEPING Toileting and hygiene offered: NoSLEEPING Sitter present: not applicable Law enforcement present: Yes ODS 

## 2014-09-12 NOTE — ED Notes (Signed)
Supper provided along with an extra drink    Appropriate to stimulation  No verbalized needs or concerns at this time  NAD assessed  Continue to monitor 

## 2014-09-12 NOTE — ED Notes (Signed)
BEHAVIORAL HEALTH ROUNDING Patient sleeping: No. Patient alert and oriented: yes Behavior appropriate: Yes.  ; If no, describe:  Nutrition and fluids offered: yes Toileting and hygiene offered: Yes  Sitter present: q15 minute observations and security camera monitoring Law enforcement present: Yes  ODS  

## 2014-09-12 NOTE — ED Notes (Signed)
BEHAVIORAL HEALTH ROUNDING Patient sleeping: Yes.   Patient alert and oriented: not applicable SLEEPING Behavior appropriate: Yes.  ; If no, describe: SLEEPING Nutrition and fluids offered: No SLEEPING Toileting and hygiene offered: NoSLEEPING Sitter present: not applicable Law enforcement present: Yes ODS 

## 2014-09-12 NOTE — ED Notes (Signed)
Pt up to bathroom.

## 2014-09-12 NOTE — ED Notes (Signed)
Brendan Gamble has been standing in the dayroom - he is yelling at security staff concerning him wanting to be placed in a "real family home" not a group home  Pt upset/anxious about not wanting to go to another group home  "i want to go to a family home."   PRN med to be administered

## 2014-09-12 NOTE — ED Notes (Signed)

## 2014-09-12 NOTE — ED Notes (Signed)
Lunch provided along with an extra drink  Pt observed with no unusual behavior  Appropriate to stimulation  No verbalized needs or concerns at this time  NAD assessed  Continue to monitor 

## 2014-09-12 NOTE — ED Notes (Signed)
Breakfast provided   Patient observed lying in bed with eyes closed  Even, unlabored respirations observed   NAD pt appears to be sleeping  I will continue to monitor along with every 15 minute visual observations and ongoing security camera monitoring    

## 2014-09-12 NOTE — ED Notes (Signed)
Drink and snack provided  Pt observed with no unusual behavior  Appropriate to stimulation  No verbalized needs or concerns at this time  NAD assessed  Continue to monitor

## 2014-09-12 NOTE — ED Notes (Signed)
meds administered as ordered   Assessment completed  Pt denies pain

## 2014-09-12 NOTE — ED Notes (Signed)
BEHAVIORAL HEALTH ROUNDING  Patient sleeping: No.  Patient alert and oriented: yes  Behavior appropriate: Yes. ; If no, describe:  Nutrition and fluids offered: Yes  Toileting and hygiene offered: Yes  Sitter present: not applicable  Law enforcement present: Yes ODS  

## 2014-09-12 NOTE — ED Notes (Signed)
Patient observed lying in bed with eyes closed  Even, unlabored respirations observed   NAD pt appears to be sleeping  I will continue to monitor along with every 15 minute visual observations and ongoing security camera monitoring    

## 2014-09-12 NOTE — ED Notes (Addendum)
ENVIRONMENTAL ASSESSMENT  Potentially harmful objects out of patient reach: Yes.  Personal belongings secured: Yes.  Patient dressed in hospital provided attire only: Yes.  Plastic bags out of patient reach: Yes.  Patient care equipment (cords, cables, call bells, lines, and drains) shortened, removed, or accounted for: Yes.  Equipment and supplies removed from bottom of stretcher: Yes.  Potentially toxic materials out of patient reach: Yes.  Sharps container removed or out of patient reach: Yes.   BEHAVIORAL HEALTH ROUNDING  Patient sleeping: No.  Patient alert and oriented: yes  Behavior appropriate: Yes. ; If no, describe:  Nutrition and fluids offered: Yes  Toileting and hygiene offered: Yes  Sitter present: not applicable  Law enforcement present: Yes ODS  ED BHU PLACEMENT JUSTIFICATION  Is the patient under IVC or is there intent for IVC: Yes.  Is the patient medically cleared: Yes.  Is there vacancy in the ED BHU: Yes.  Is the population mix appropriate for patient: Yes.  Is the patient awaiting placement in inpatient or outpatient setting: Yes.  Has the patient had a psychiatric consult: Yes.  Survey of unit performed for contraband, proper placement and condition of furniture, tampering with fixtures in bathroom, shower, and each patient room: Yes. ; Findings: All clear  APPEARANCE/BEHAVIOR  calm, cooperative and adequate rapport can be established  NEURO ASSESSMENT  Orientation: time, place and person  Hallucinations: No.None noted (Hallucinations)  Speech: Normal  Gait: normal  RESPIRATORY ASSESSMENT  WNL  CARDIOVASCULAR ASSESSMENT  WNL  GASTROINTESTINAL ASSESSMENT  WNL  EXTREMITIES  WNL  PLAN OF CARE  Provide calm/safe environment. Vital signs assessed twice daily. ED BHU Assessment once each 12-hour shift. Collaborate with intake RN daily or as condition indicates. Assure the ED provider has rounded once each shift. Provide and encourage hygiene. Provide  redirection as needed. Assess for escalating behavior; address immediately and inform ED provider.  Assess family dynamic and appropriateness for visitation as needed: Yes. ; If necessary, describe findings:  Educate the patient/family about BHU procedures/visitation: Yes. ; If necessary, describe findings: Pt is calm and cooperative at this time. Pt understanding and accepting of unit procedures/rules. Will continue to monitor.  

## 2014-09-13 MED ORDER — SERTRALINE HCL 100 MG PO TABS
ORAL_TABLET | ORAL | Status: AC
  Administered 2014-09-13: 100 mg via ORAL
  Filled 2014-09-13: qty 1

## 2014-09-13 MED ORDER — LORAZEPAM 0.5 MG PO TABS
ORAL_TABLET | ORAL | Status: AC
  Filled 2014-09-13: qty 1

## 2014-09-13 MED ORDER — CARBAMAZEPINE ER 200 MG PO TB12
800.0000 mg | ORAL_TABLET | Freq: Two times a day (BID) | ORAL | Status: DC
  Administered 2014-09-13 – 2014-09-18 (×11): 800 mg via ORAL
  Filled 2014-09-13 (×5): qty 4
  Filled 2014-09-13: qty 2
  Filled 2014-09-13 (×2): qty 4
  Filled 2014-09-13: qty 2
  Filled 2014-09-13 (×3): qty 4

## 2014-09-13 NOTE — ED Notes (Signed)
BEHAVIORAL HEALTH ROUNDING Patient sleeping: Yes.   Patient alert and oriented: not applicable SLEEPING Behavior appropriate: Yes.  ; If no, describe: SLEEPING Nutrition and fluids offered: No SLEEPING Toileting and hygiene offered: NoSLEEPING Sitter present: not applicable Law enforcement present: Yes ODS 

## 2014-09-13 NOTE — ED Notes (Signed)
Pt requested use of phone.

## 2014-09-13 NOTE — Consult Note (Signed)
  Psychiatry Pt seen today in BHU.: Follow-up for this patient with developmental disability and autistic disorder. No new complaints. Behavior calm. Continues to repeat his request that he be placed with a nice family. Psychiatrically stable. No indication to change any treatment plan. Plan - Continue IVC and wait for placement.

## 2014-09-13 NOTE — ED Notes (Signed)
BEHAVIORAL HEALTH ROUNDING Patient sleeping: No. Patient alert and oriented: yes Behavior appropriate: Yes.  ; If no, describe:  Nutrition and fluids offered: No Toileting and hygiene offered: No Sitter present: not applicable Law enforcement present: Yes  

## 2014-09-13 NOTE — ED Notes (Signed)
BEHAVIORAL HEALTH ROUNDING Patient sleeping: No. Patient alert and oriented: yes Behavior appropriate: Yes.  ; If no, describe:  Nutrition and fluids offered: Yes  Toileting and hygiene offered: Yes  Sitter present: not applicable Law enforcement present: Yes  

## 2014-09-13 NOTE — ED Notes (Signed)
ED BHU PLACEMENT JUSTIFICATION Is the patient under IVC or is there intent for IVC: Yes.   Is the patient medically cleared: Yes.   Is there vacancy in the ED BHU: Yes.   Is the population mix appropriate for patient: Yes.   Is the patient awaiting placement in inpatient or outpatient setting: Yes.   Has the patient had a psychiatric consult: Yes.   Survey of unit performed for contraband, proper placement and condition of furniture, tampering with fixtures in bathroom, shower, and each patient room: Yes.  ; Findings:  APPEARANCE/BEHAVIOR calm and adequate rapport can be established NEURO ASSESSMENT Orientation: place and person Hallucinations: No.None noted (Hallucinations) Speech: Normal Gait: normal RESPIRATORY ASSESSMENT Normal expansion.  Clear to auscultation.  No rales, rhonchi, or wheezing. CARDIOVASCULAR ASSESSMENT regular rate and rhythm, S1, S2 normal, no murmur, click, rub or gallop GASTROINTESTINAL ASSESSMENT soft, nontender, BS WNL, no r/g EXTREMITIES normal strength, tone, and muscle mass PLAN OF CARE Provide calm/safe environment. Vital signs assessed twice daily. ED BHU Assessment once each 12-hour shift. Collaborate with intake RN daily or as condition indicates. Assure the ED provider has rounded once each shift. Provide and encourage hygiene. Provide redirection as needed. Assess for escalating behavior; address immediately and inform ED provider.  Assess family dynamic and appropriateness for visitation as needed: Yes.  ; If necessary, describe findings:  Educate the patient/family about BHU procedures/visitation: Yes.  ; If necessary, describe findings:

## 2014-09-13 NOTE — ED Notes (Signed)
Pt up to bathroom and returned to bed.  

## 2014-09-13 NOTE — ED Notes (Signed)

## 2014-09-13 NOTE — ED Notes (Signed)
BEHAVIORAL HEALTH ROUNDING  Patient sleeping: No.  Patient alert and oriented: yes  Behavior appropriate: Yes. ; If no, describe:  Nutrition and fluids offered: Yes  Toileting and hygiene offered: Yes  Sitter present: not applicable  Law enforcement present: Yes ODS  

## 2014-09-13 NOTE — ED Notes (Signed)
BEHAVIORAL HEALTH ROUNDING Patient sleeping: Yes.   Patient alert and oriented: not applicable Behavior appropriate: Yes.  ; If no, describe:  Nutrition and fluids offered: No Toileting and hygiene offered: No Sitter present: not applicable Law enforcement present: Yes  

## 2014-09-13 NOTE — ED Provider Notes (Signed)
-----------------------------------------   7:57 AM on 09/13/2014 -----------------------------------------   BP 122/63 mmHg  Pulse 71  Temp(Src) 99 F (37.2 C) (Oral)  Resp 18  Ht 5\' 8"  (1.727 m)  Wt 135 lb (61.236 kg)  BMI 20.53 kg/m2  SpO2 99%  The patient had no acute events since last update.  Calm and cooperative at this time.  Disposition is pending per Psychiatry/Behavioral Medicine team recommendations.     Minna Antis, MD 09/13/14 [phone]

## 2014-09-13 NOTE — ED Notes (Addendum)
BEHAVIORAL HEALTH ROUNDING Patient sleeping: No. Patient alert and oriented: yes Behavior appropriate: Yes.  ; If no, describe:   Nutrition and fluids offered: Yes  Toileting and hygiene offered: Yes  Sitter present: no Law enforcement present: Yes  and ODS  ED BHU PLACEMENT JUSTIFICATION Is the patient under IVC or is there intent for IVC: Yes.   Is the patient medically cleared: Yes.   Is there vacancy in the ED BHU: Yes.   Is the population mix appropriate for patient: Yes.   Is the patient awaiting placement in inpatient or outpatient setting: Yes.   Has the patient had a psychiatric consult: Yes.   Survey of unit performed for contraband, proper placement and condition of furniture, tampering with fixtures in bathroom, shower, and each patient room: Yes.  ; Findings: all clear APPEARANCE/BEHAVIOR calm and cooperative NEURO ASSESSMENT Orientation: time, place and person Hallucinations: No.None noted (Hallucinations) Speech: Normal Gait: normal RESPIRATORY ASSESSMENT Normal expansion.  Clear to auscultation.  No rales, rhonchi, or wheezing. CARDIOVASCULAR ASSESSMENT regular rate and rhythm, S1, S2 normal, no murmur, click, rub or gallop GASTROINTESTINAL ASSESSMENT soft, nontender, BS WNL, no r/g EXTREMITIES normal strength, tone, and muscle mass PLAN OF CARE Provide calm/safe environment. Vital signs assessed twice daily. ED BHU Assessment once each 12-hour shift. Collaborate with intake RN daily or as condition indicates. Assure the ED provider has rounded once each shift. Provide and encourage hygiene. Provide redirection as needed. Assess for escalating behavior; address immediately and inform ED provider.  Assess family dynamic and appropriateness for visitation as needed: No.; If necessary, describe findings: not observed Educate the patient/family about BHU procedures/visitation: No.; If necessary, describe findings: NA

## 2014-09-13 NOTE — ED Notes (Signed)
Dinner served

## 2014-09-13 NOTE — ED Notes (Signed)

## 2014-09-14 MED ORDER — CARBAMAZEPINE 200 MG PO TABS
ORAL_TABLET | ORAL | Status: AC
Start: 2014-09-14 — End: 2014-09-15
  Filled 2014-09-14: qty 4

## 2014-09-14 MED ORDER — CARBAMAZEPINE ER 200 MG PO CP12
ORAL_CAPSULE | ORAL | Status: AC
  Filled 2014-09-14: qty 4

## 2014-09-14 MED ORDER — SERTRALINE HCL 100 MG PO TABS
ORAL_TABLET | ORAL | Status: AC
  Filled 2014-09-14: qty 1

## 2014-09-14 MED ORDER — SERTRALINE HCL 100 MG PO TABS
ORAL_TABLET | ORAL | Status: AC
  Administered 2014-09-14: 100 mg via ORAL
  Filled 2014-09-14: qty 1

## 2014-09-14 MED ORDER — TETANUS-DIPHTH-ACELL PERTUSSIS 5-2.5-18.5 LF-MCG/0.5 IM SUSP
INTRAMUSCULAR | Status: AC
  Filled 2014-09-14: qty 0.5

## 2014-09-14 NOTE — ED Notes (Signed)
BEHAVIORAL HEALTH ROUNDING Patient sleeping: No. Patient alert and oriented: yes Behavior appropriate: Yes.  ; If no, describe:  Nutrition and fluids offered: Yes  Toileting and hygiene offered: Yes  Sitter present: no Law enforcement present: Yes  

## 2014-09-14 NOTE — ED Notes (Signed)
BEHAVIORAL HEALTH ROUNDING Patient sleeping: Yes.   Patient alert and oriented: not applicable Behavior appropriate: Yes.  ; If no, describe:   Nutrition and fluids offered: No Toileting and hygiene offered: No Sitter present: no Law enforcement present: Yes  and ODS    

## 2014-09-14 NOTE — ED Notes (Signed)
BEHAVIORAL HEALTH ROUNDING Patient sleeping: Yes.   Patient alert and oriented: yes Behavior appropriate: Yes.  ; If no, describe:  Nutrition and fluids offered: Yes  Toileting and hygiene offered: Yes  Sitter present: no Law enforcement present: Yes  

## 2014-09-14 NOTE — ED Notes (Signed)

## 2014-09-14 NOTE — ED Notes (Signed)
BEHAVIORAL HEALTH ROUNDING Patient sleeping: No. Patient alert and oriented: yes Behavior appropriate: Yes.  ; If no, describe:  Nutrition and fluids offered: Yes  Toileting and hygiene offered: Yes  Sitter present:no Law enforcement present: Yes    Pt in shower at this time.

## 2014-09-14 NOTE — ED Notes (Signed)
BEHAVIORAL HEALTH ROUNDING Patient sleeping: No. Patient alert and oriented: yes Behavior appropriate: Yes.  ; If no, describe:  Nutrition and fluids offered: Yes  Toileting and hygiene offered: Yes  Sitter present: no Law enforcement present: Yes   Pt refused to shower at this time. Awaiting possible placement today.

## 2014-09-14 NOTE — ED Notes (Signed)

## 2014-09-14 NOTE — ED Notes (Addendum)
Pt asking if he can get his diploma from his high school because graduation was in June.  Pt states mother called school and the school said they don't have it.  Pt appears to be fixated.

## 2014-09-14 NOTE — BH Specialist Note (Signed)
Telephone call was received from [facility] with [employer] @ 14:35 stating, that, she was calling on behalf of client per; Lorenza Evangelist, who's his care coordinator stating that client is to have an appointment @ [facility] tomorrow; for [facility] to contact Ms. Sessoms or Ms. Williams when client is to be discharged---[phone]."

## 2014-09-14 NOTE — ED Notes (Addendum)
BEHAVIORAL HEALTH ROUNDING Patient sleeping: No. Patient alert and oriented: yes Behavior appropriate: Yes.  ; If no, describe:   Nutrition and fluids offered: Yes  Toileting and hygiene offered: Yes  Sitter present: no Law enforcement present: Yes  and ODS   Pt requesting this RN to call [employer] to get his diploma, pt directed back to bed, asked to address concern Monday during business hours.

## 2014-09-14 NOTE — Consult Note (Signed)
  Psychiatry: Follow-up for this patient with autism and developmental disability. He is exactly the same as he is every day. No new complaints. Vital signs stable. No physical complaints. Every time I ask I am reassured that there is some kind of plan being worked on for placement. I have not seen much sign of any progress. Apparently we remain completely dependent on [employer]. I believe the patient is coming up on 2 solid months in this little emergency room area. Psychiatrically stable.

## 2014-09-15 MED ORDER — SERTRALINE HCL 100 MG PO TABS
ORAL_TABLET | ORAL | Status: AC
  Administered 2014-09-15: 100 mg
  Filled 2014-09-15: qty 1

## 2014-09-15 MED ORDER — SERTRALINE HCL 100 MG PO TABS
ORAL_TABLET | ORAL | Status: AC
  Administered 2014-09-15: 100 mg via ORAL
  Filled 2014-09-15: qty 1

## 2014-09-15 NOTE — ED Notes (Signed)
Pt ambulating to the BR

## 2014-09-15 NOTE — ED Notes (Signed)
BEHAVIORAL HEALTH ROUNDING  Patient sleeping: Yes.  Patient alert and oriented: no  Behavior appropriate: Yes. ; If no, describe:  Nutrition and fluids offered: No  Toileting and hygiene offered: No  Sitter present: no  Law enforcement present: Yes   

## 2014-09-15 NOTE — ED Notes (Signed)
Am meds administered as ordered  Assessment completed  Pt denies pain   Appropriate to stimulation  No verbalized needs or concerns at this time  NAD assessed  Continue to monitor

## 2014-09-15 NOTE — ED Notes (Signed)
BEHAVIORAL HEALTH ROUNDING Patient sleeping: No. Patient alert and oriented: yes Behavior appropriate: Yes.  ; If no, describe:  Nutrition and fluids offered: yes Toileting and hygiene offered: Yes  Sitter present: q15 minute observations and security camera monitoring Law enforcement present: Yes  ODS  

## 2014-09-15 NOTE — ED Notes (Addendum)
Pt laying in bed.  

## 2014-09-15 NOTE — ED Notes (Signed)
Lunch provided along with an extra drink  Pt observed with no unusual behavior  Appropriate to stimulation  No verbalized needs or concerns at this time  NAD assessed  Continue to monitor 

## 2014-09-15 NOTE — Consult Note (Signed)
  Psychiatry: Follow-up for patient with chronic intellectual impairment of moderate severity and autistic spectrum disorder. No new complaints today. No behavior problems noted. Patient continues to interact appropriately. Compliant with medicine. Compliant with medical treatment. He is completely psychiatrically stable. He has been waiting in our emergency room for this extended period of time so that his case manager can find some kind of placement. No known progress at this point. Continue daily supportive encouragement with no change to medication or diagnosis.

## 2014-09-15 NOTE — ED Notes (Signed)
Pt given snack tray. 

## 2014-09-15 NOTE — ED Notes (Signed)

## 2014-09-15 NOTE — ED Notes (Signed)
Pt. Up using bathroom. 

## 2014-09-15 NOTE — ED Notes (Signed)

## 2014-09-15 NOTE — ED Notes (Signed)

## 2014-09-15 NOTE — ED Notes (Signed)
BEHAVIORAL HEALTH ROUNDING Patient sleeping: No. Patient alert and oriented: yes Behavior appropriate: Yes.  ; If no, describe:  Nutrition and fluids offered: Yes  Toileting and hygiene offered: Yes  Sitter present: no Law enforcement present: Yes  

## 2014-09-15 NOTE — ED Notes (Signed)
Patient observed lying in bed with eyes closed  Even, unlabored respirations observed   NAD pt appears to be sleeping  I will continue to monitor along with every 15 minute visual observations and ongoing security camera monitoring    

## 2014-09-15 NOTE — ED Notes (Signed)
Report received from Amy RN. Patient care assumed. Patient/RN introduction complete. Will continue to monitor.  

## 2014-09-15 NOTE — ED Notes (Signed)
Pt walking around the dayroom, calm and cooperative at this time with no distress or complaints noted.  Will continue to monitor pt awaiting group home placement.

## 2014-09-15 NOTE — ED Notes (Signed)

## 2014-09-15 NOTE — ED Notes (Signed)
meds given per md order  

## 2014-09-15 NOTE — ED Notes (Signed)
Pt is sitting in the dayroom  Pt requests that we call his mother and tell her to come and see him   Appropriate to stimulation  No verbalized needs or concerns at this time  NAD assessed  Continue to monitor

## 2014-09-15 NOTE — ED Notes (Signed)
Pt verbalizes that he would like a note placed on his chart  Pt requests that the note read  "I call my mom and she does not answer the phone anymore.  I wish my family would want to hug me."

## 2014-09-15 NOTE — ED Notes (Signed)
Supper provided along with an extra drink  Pt observed with no unusual behavior  Appropriate to stimulation  No verbalized needs or concerns at this time  NAD assessed  Continue to monitor 

## 2014-09-15 NOTE — ED Notes (Signed)
Breakfast provided  Pt awake and talking about "magic pens"  Pt states  "Why don't you buy me some magic pens - I want some."    Pt informed that I cannot buy magic pens   NAD observed  Denies pain

## 2014-09-15 NOTE — ED Notes (Signed)

## 2014-09-15 NOTE — ED Notes (Signed)

## 2014-09-15 NOTE — ED Provider Notes (Signed)
-----------------------------------------   8:06 AM on 09/15/2014 -----------------------------------------   BP 98/56 mmHg  Pulse 66  Temp(Src) 98 F (36.7 C) (Oral)  Resp 18  Ht 5\' 8"  (1.727 m)  Wt 135 lb (61.236 kg)  BMI 20.53 kg/m2  SpO2 99%  The patient had no acute events since last update.  Calm and cooperative at this time.  Disposition is pending per Psychiatry/Behavioral Medicine team recommendations.      Gayla Doss, MD 09/15/14 [phone]

## 2014-09-16 MED ORDER — SERTRALINE HCL 100 MG PO TABS
ORAL_TABLET | ORAL | Status: AC
  Filled 2014-09-16: qty 1

## 2014-09-16 MED ORDER — LEVETIRACETAM 500 MG PO TABS
ORAL_TABLET | ORAL | Status: AC
  Filled 2014-09-16: qty 2

## 2014-09-16 MED ORDER — CARBAMAZEPINE 200 MG PO TABS
ORAL_TABLET | ORAL | Status: AC
  Filled 2014-09-16: qty 4

## 2014-09-16 MED ORDER — SERTRALINE HCL 100 MG PO TABS
ORAL_TABLET | ORAL | Status: AC
  Administered 2014-09-16: 100 mg via ORAL
  Filled 2014-09-16: qty 1

## 2014-09-16 NOTE — ED Notes (Addendum)
BEHAVIORAL HEALTH ROUNDING Patient sleeping: No. Patient alert and oriented: yes Behavior appropriate: Yes.  ;  Nutrition and fluids offered: Yes  Toileting and hygiene offered: Yes  Sitter present: not applicable Law enforcement present: Yes   

## 2014-09-16 NOTE — ED Notes (Signed)

## 2014-09-16 NOTE — ED Notes (Signed)
BEHAVIORAL HEALTH ROUNDING Patient sleeping: No. Patient alert and oriented: yes Behavior appropriate: Yes.  ; If no, describe:  Nutrition and fluids offered: Yes  Toileting and hygiene offered: Yes  Sitter present: yes Law enforcement present: Yes  

## 2014-09-16 NOTE — ED Notes (Signed)

## 2014-09-16 NOTE — ED Notes (Signed)
BEHAVIORAL HEALTH ROUNDING Patient sleeping: no Patient alert and oriented: yes Behavior appropriate: Yes.  ; Nutrition and fluids offered: Yes  Toileting and hygiene offered: Yes  Sitter present: not applicable Law enforcement present: Yes   

## 2014-09-16 NOTE — ED Notes (Signed)
Pt laying in bed.  

## 2014-09-16 NOTE — ED Notes (Signed)

## 2014-09-16 NOTE — ED Notes (Signed)
ENVIRONMENTAL ASSESSMENT Potentially harmful objects out of patient reach: Yes.   Personal belongings secured: Yes.   Patient dressed in hospital provided attire only: Yes.   Plastic bags out of patient reach: Yes.   Patient care equipment (cords, cables, call bells, lines, and drains) shortened, removed, or accounted for: Yes.   Equipment and supplies removed from bottom of stretcher: N/A Potentially toxic materials out of patient reach: Yes.   Sharps container removed or out of patient reach: N/A 

## 2014-09-16 NOTE — ED Notes (Signed)
Pt resting in bed without distress noted will continue to monitor.

## 2014-09-16 NOTE — ED Provider Notes (Signed)
-----------------------------------------   6:31 AM on 09/16/2014 -----------------------------------------   BP 117/66 mmHg  Pulse 81  Temp(Src) 99.1 F (37.3 C) (Oral)  Resp 18  Ht 5\' 8"  (1.727 m)  Wt 135 lb (61.236 kg)  BMI 20.53 kg/m2  SpO2 97%  The patient had no acute events since last update.  Calm and cooperative at this time.  Disposition is pending per Psychiatry/Behavioral Medicine team recommendations.     Irean Hong, MD 09/16/14 [phone]

## 2014-09-16 NOTE — ED Notes (Signed)
No change in condition will continue to monitor  

## 2014-09-16 NOTE — ED Notes (Signed)
Pt in dayroom watching TV

## 2014-09-16 NOTE — ED Notes (Signed)
Resumed care from [facility].  Pt showered and change of scrubs.   Pt now in dayroom walking around,.   Pt calm and cooperative.

## 2014-09-16 NOTE — ED Notes (Signed)
Pt eating lunch at this time

## 2014-09-16 NOTE — ED Notes (Signed)
BEHAVIORAL HEALTH ROUNDING Patient sleeping: No. Patient alert and oriented: yes Behavior appropriate: No.; Nutrition and fluids offered: Yes  Toileting and hygiene offered: Yes  Sitter present: yes Law enforcement present: Yes   

## 2014-09-16 NOTE — ED Notes (Signed)
Breakfast tray given. °

## 2014-09-16 NOTE — ED Notes (Signed)
Pt fixated on calling his mom Huntley Dec) Claris Che and his dad and some people in his family. Pt states some of his family are not answering the phone. Nurse attempted once to call from desk phone and transfer to pt but it went to the voicemail. Pt Informed. Pt asks nurse to charge phone to see if that helps. Nurse informs pt that he someone else needs to use the phone. Pt cooperative.

## 2014-09-16 NOTE — Consult Note (Signed)
  Psychiatry: No change for this 24 year old man with a history of autism and moderate mental retardation. No new complaints. Behavior continues to be mostly easily directable. No violence or threats. We continue to await the LME finding a disposition for him.

## 2014-09-16 NOTE — ED Notes (Signed)
BEHAVIORAL HEALTH ROUNDING Patient sleeping: No. Patient alert and oriented: yes Behavior appropriate: Yes.  ;  Nutrition and fluids offered: Yes  Toileting and hygiene offered: Yes  Sitter present: not applicable Law enforcement present: Yes   

## 2014-09-16 NOTE — ED Notes (Signed)
Pt in dayroom  

## 2014-09-16 NOTE — ED Notes (Signed)
Pt to bathroom. Noted on camera that pt is tampering with panic button. Pt instructed to stop and come out of the bathroom. Pt comes out of bathroom and stands in day room.

## 2014-09-17 MED ORDER — LEVETIRACETAM 750 MG PO TABS
750.0000 mg | ORAL_TABLET | Freq: Two times a day (BID) | ORAL | Status: DC
Start: 2014-09-17 — End: 2014-10-19

## 2014-09-17 MED ORDER — LORAZEPAM 0.5 MG PO TABS: 0.5000 mg | ORAL_TABLET | Freq: Three times a day (TID) | ORAL | Status: DC | PRN

## 2014-09-17 MED ORDER — SERTRALINE HCL 100 MG PO TABS
ORAL_TABLET | ORAL | Status: AC
  Administered 2014-09-17: 100 mg via ORAL
  Filled 2014-09-17: qty 1

## 2014-09-17 MED ORDER — SERTRALINE HCL 100 MG PO TABS
ORAL_TABLET | ORAL | Status: AC
  Filled 2014-09-17: qty 1

## 2014-09-17 MED ORDER — ZONISAMIDE 100 MG PO CAPS: 100.0000 mg | ORAL_CAPSULE | Freq: Two times a day (BID) | ORAL | Status: DC

## 2014-09-17 MED ORDER — SERTRALINE HCL 100 MG PO TABS: 100.0000 mg | ORAL_TABLET | Freq: Two times a day (BID) | ORAL | Status: DC

## 2014-09-17 MED ORDER — TUBERCULIN PPD 5 UNIT/0.1ML ID SOLN
5.0000 [IU] | Freq: Once | INTRADERMAL | Status: DC
  Administered 2014-09-17: 5 [IU] via INTRADERMAL
  Filled 2014-09-17 (×5): qty 0.1

## 2014-09-17 MED ORDER — CARBAMAZEPINE ER 400 MG PO TB12: 800.0000 mg | ORAL_TABLET | Freq: Two times a day (BID) | ORAL | Status: DC

## 2014-09-17 NOTE — ED Notes (Signed)
Pt laying in bed.  

## 2014-09-17 NOTE — ED Notes (Signed)
BEHAVIORAL HEALTH ROUNDING Patient sleeping: Yes.   Patient alert and oriented: not applicable Behavior appropriate: Yes.  ; If no, describe:  Nutrition and fluids offered: Yes  Toileting and hygiene offered: Yes  Sitter present: no Law enforcement present: Yes  

## 2014-09-17 NOTE — ED Notes (Signed)
Pt. Noted sleeping  room. No complaints or concerns voiced. No distress or abnormal behavior noted. Will continue to monitor with security cameras. Q 15 minute rounds continue.

## 2014-09-17 NOTE — ED Notes (Signed)
Pt. Noted in room. No complaints or concerns voiced. No distress or abnormal behavior noted. Will continue to monitor with security cameras. Q 15 minute rounds continue. Sandwich and soft drink given. 

## 2014-09-17 NOTE — ED Notes (Signed)
Pt. Noted sleeping in room. No complaints or concerns voiced. No distress or abnormal behavior noted. Will continue to monitor with security cameras. Q 15 minute rounds continue. 

## 2014-09-17 NOTE — Consult Note (Signed)
  Psychiatry: Follow-up 24 year old man with moderate mental retardation and autism. No change in clinical state. No change in behavior. No new complaints. Patient is still disorganized but not hostile here. We are still awaiting appropriate placement. No change to treatment plan.

## 2014-09-17 NOTE — ED Notes (Signed)

## 2014-09-17 NOTE — ED Notes (Signed)

## 2014-09-17 NOTE — ED Notes (Addendum)
Report received from [facility]. Pt. Alert and in no distress denies SI, HI, AVH and pain.  Pt. Instructed to come to me with problems or concerns.Will continue to monitor for safety via security cameras and Q 15 minute checks.

## 2014-09-17 NOTE — ED Notes (Signed)
BEHAVIORAL HEALTH ROUNDING Patient sleeping: No. Patient alert and oriented: yes Behavior appropriate: Yes.  ; If no, describe:  Nutrition and fluids offered: Yes  Toileting and hygiene offered: Yes  Sitter present: no Law enforcement present: Yes  

## 2014-09-17 NOTE — ED Notes (Signed)
No change in condition will continue to monitor  

## 2014-09-17 NOTE — BHH Counselor (Signed)
Received phone call from Care Coordinator (Tammie Rivers-[phone]) stating, the plans for him to move into [facility] has been cancelled. Due to [facility] department delaying approval for the funding and ongoing request to change forms and her rates. Thus, she gave the bed to another patient.  However, another Group Home([facility]-[phone] or [phone]) is willing to have the patient move into their Facility. She requested, FL2, Two PPD Test and Prescriptions. Writer spoke with Psych MD (Dr. Toni Amend), patient nurse Corwin Levins) and Charge Nurse Lorna Dibble.) about what was needed and they were able to provided the necessary documents.  Additional Information Information faxed to [facility] and confirmed it was received. Pending review, will need to follow up tomorrow (09/18/2014) for final determination.

## 2014-09-17 NOTE — ED Notes (Signed)
Pt sleeping. No known issues at this time.

## 2014-09-18 MED ORDER — SERTRALINE HCL 100 MG PO TABS
ORAL_TABLET | ORAL | Status: AC
  Administered 2014-09-18: 100 mg via ORAL
  Filled 2014-09-18: qty 1

## 2014-09-18 MED ORDER — LEVETIRACETAM 500 MG PO TABS
ORAL_TABLET | ORAL | Status: AC
  Filled 2014-09-18: qty 2

## 2014-09-18 NOTE — ED Notes (Signed)
Pt. Noted sleeping in room. No complaints or concerns voiced. No distress or abnormal behavior noted. Will continue to monitor with security cameras. Q 15 minute rounds continue. 

## 2014-09-18 NOTE — ED Notes (Signed)
BEHAVIORAL HEALTH ROUNDING Patient sleeping: No. Patient alert and oriented: yes Behavior appropriate: Yes.  ; If no, describe:  Nutrition and fluids offered: Yes  Toileting and hygiene offered: Yes  Sitter present: no Law enforcement present: Yes  

## 2014-09-18 NOTE — Discharge Instructions (Signed)
Take medications as prescribed by Dr. Toni Amend. Follow with her usual provider this coming week. Return to the emergency department if there are urgent concerns.

## 2014-09-18 NOTE — ED Notes (Signed)
Pt refused shower.  

## 2014-09-18 NOTE — ED Provider Notes (Signed)
-----------------------------------------   5:55 PM on 09/18/2014 -----------------------------------------   BP 119/64 mmHg  Pulse 78  Temp(Src) 98.4 F (36.9 C) (Oral)  Resp 18  Ht 5\' 8"  (1.727 m)  Wt 135 lb (61.236 kg)  BMI 20.53 kg/m2  SpO2 99%   I have discussed this patient's case with Dr. Toni Amend was seen this patient. The patient is now stable and able to return to his group home. The group home will come and pick him up here. Dr. Toni Amend will be writing prescriptions for ongoing medications    Darien Ramus, MD 09/18/14 1758

## 2014-09-18 NOTE — Consult Note (Signed)
  Psychiatry: Follow-up for this 24 year old man with autistic spectrum disorder and moderate chronic intellectual developmental disorder. He has been in our emergency room for nearly 2 months. Today we are told that a group home is willing to take him to [place]. All the prescriptions are done, all the paperwork is completed. We are all hoping that this will work out. Patient has been notified about the potential plan.  Recent review of systems has been negative. He has no physical complaints no specific mental health complaints.  Physically he appears to continue to be in good health. No sign of any side effects from medicine. Doesn't appear to be sick. He is usually quite cooperative although prone to sometimes getting slightly irritated. He has not been violent or aggressive since being in the emergency room. No evidence of suicidality.  Current medication is Tegretol 800 mg twice a day, Keppra 750 mg twice a day, Ativan 0.5 mg twice a day, Zoloft 100 mg per day, Zonegran 100 mg per day.  Diagnosis autistic spectrum disorder, moderate chronic intellectual impairment

## 2014-09-18 NOTE — Progress Notes (Signed)
Call to group home in [facility] spoke with owner Brendan Gamble. [phone] or [phone]) to confirm when she would transport patient to group home.  Ms. Brendan Gamble has a few concerns as states she did not know patient was Autistic.  Ms. Brendan Gamble will come meet with patient for site visit today or tomorrow.    Brendan Gamble. Theresia Majors, MSW Clinical Social Work Department Emergency Room [phone] 10:51 AM

## 2014-09-18 NOTE — Progress Notes (Signed)
Call from [facility], all community resources are in place for patient and [facility] in [place] will come pick patient up today.  Informed CSW patient's mother has been notified.  Tammie provided CSW with phone number of [facility] in [place] on [address].  [facility], CSW faxed Rx over for patient.  Obtained signature from MD and discussed the discontinue of IV for patient.  Psych MD to discontinue IVC, patient will discharge to [facility] [address] [place] [address] .  Kathie [facility]-[phone] or [phone]).    Call from [facility], Ms Wynonia Sours [phone]  will pick patient up today after 6:00 pm.  CSW in to meet with patient to inform him he will discharge to a new group home.  All paper work given to Diplomatic Services operational officer to complete discharge packet with FL2 and Rx's.    Sammuel Hines. Theresia Majors, MSW Clinical Social Work Department Emergency Room [phone] 5:36 PM

## 2014-09-18 NOTE — ED Notes (Signed)
Nina MCGregor from [facility] is here to transport patient and receive discharge instructions.

## 2014-10-19 ENCOUNTER — Other Ambulatory Visit: Payer: Self-pay | Admitting: Psychiatry

## 2014-10-20 ENCOUNTER — Encounter (HOSPITAL_COMMUNITY): Payer: Self-pay

## 2014-10-20 ENCOUNTER — Emergency Department (HOSPITAL_COMMUNITY)
Admission: EM | Admit: 2014-10-20 | Discharge: 2014-10-21 | Disposition: A | Discharge status: Home / Self Care | Payer: Medicaid Other | Attending: Physician Assistant | Admitting: Physician Assistant

## 2014-10-20 DIAGNOSIS — Z046 Encounter for general psychiatric examination, requested by authority: Secondary | ICD-10-CM

## 2014-10-20 DIAGNOSIS — F840 Autistic disorder: Secondary | ICD-10-CM | POA: Diagnosis present

## 2014-10-20 LAB — CBC WITH DIFFERENTIAL/PLATELET
Basophils Absolute: 0 10*3/uL (ref 0.0–0.1)
Basophils Relative: 1 % (ref 0–1)
Eosinophils Absolute: 0.2 10*3/uL (ref 0.0–0.7)
Eosinophils Relative: 4 % (ref 0–5)
HEMATOCRIT: 44.6 % (ref 39.0–52.0)
HEMOGLOBIN: 14.3 g/dL (ref 13.0–17.0)
Lymphocytes Relative: 31 % (ref 12–46)
Lymphs Abs: 1.3 10*3/uL (ref 0.7–4.0)
MCH: 25.8 pg — ABNORMAL LOW (ref 26.0–34.0)
MCHC: 32.1 g/dL (ref 30.0–36.0)
MCV: 80.5 fL (ref 78.0–100.0)
MONO ABS: 0.4 10*3/uL (ref 0.1–1.0)
MONOS PCT: 10 % (ref 3–12)
Neutro Abs: 2.4 10*3/uL (ref 1.7–7.7)
Neutrophils Relative %: 54 % (ref 43–77)
Platelets: 226 10*3/uL (ref 150–400)
RBC: 5.54 MIL/uL (ref 4.22–5.81)
RDW: 15.1 % (ref 11.5–15.5)
WBC: 4.3 10*3/uL (ref 4.0–10.5)

## 2014-10-20 LAB — COMPREHENSIVE METABOLIC PANEL
ALT: 17 U/L (ref 17–63)
ANION GAP: 8 (ref 5–15)
AST: 25 U/L (ref 15–41)
Albumin: 4.5 g/dL (ref 3.5–5.0)
Alkaline Phosphatase: 69 U/L (ref 38–126)
BUN: 10 mg/dL (ref 6–20)
CALCIUM: 8.9 mg/dL (ref 8.9–10.3)
CHLORIDE: 108 mmol/L (ref 101–111)
CO2: 23 mmol/L (ref 22–32)
Creatinine, Ser: 0.86 mg/dL (ref 0.61–1.24)
GFR calc Af Amer: >60 mL/min (ref >60)
GFR calc non Af Amer: >60 mL/min (ref >60)
Glucose, Bld: 105 mg/dL — ABNORMAL HIGH (ref 65–99)
Potassium: 3.5 mmol/L (ref 3.5–5.1)
Sodium: 139 mmol/L (ref 135–145)
Total Bilirubin: 0.5 mg/dL (ref 0.3–1.2)
Total Protein: 7.6 g/dL (ref 6.5–8.1)

## 2014-10-20 LAB — SALICYLATE LEVEL: Salicylate Lvl: <4 mg/dL (ref 2.8–30.0)

## 2014-10-20 LAB — ACETAMINOPHEN LEVEL: Acetaminophen (Tylenol), Serum: <10 ug/mL — ABNORMAL LOW (ref 10–30)

## 2014-10-20 LAB — ETHANOL: Alcohol, Ethyl (B): <5 mg/dL (ref <5)

## 2014-10-20 MED ORDER — CARBAMAZEPINE ER 400 MG PO TB12
800.0000 mg | ORAL_TABLET | Freq: Two times a day (BID) | ORAL | Status: DC
  Administered 2014-10-20 – 2014-10-21 (×2): 800 mg via ORAL
  Filled 2014-10-20 (×3): qty 2

## 2014-10-20 MED ORDER — METOPROLOL TARTRATE 25 MG PO TABS
25.0000 mg | ORAL_TABLET | Freq: Three times a day (TID) | ORAL | Status: DC
  Administered 2014-10-21: 25 mg via ORAL
  Filled 2014-10-20: qty 1

## 2014-10-20 MED ORDER — IBUPROFEN 200 MG PO TABS: 600.0000 mg | ORAL_TABLET | Freq: Three times a day (TID) | ORAL | Status: DC | PRN

## 2014-10-20 MED ORDER — ZONISAMIDE 100 MG PO CAPS
100.0000 mg | ORAL_CAPSULE | Freq: Two times a day (BID) | ORAL | Status: DC
  Administered 2014-10-20 – 2014-10-21 (×2): 100 mg via ORAL
  Filled 2014-10-20 (×3): qty 1

## 2014-10-20 MED ORDER — SERTRALINE HCL 50 MG PO TABS
100.0000 mg | ORAL_TABLET | Freq: Every day | ORAL | Status: DC
  Administered 2014-10-21: 100 mg via ORAL
  Filled 2014-10-20: qty 2

## 2014-10-20 MED ORDER — ACETAMINOPHEN 325 MG PO TABS: 650.0000 mg | ORAL_TABLET | ORAL | Status: DC | PRN

## 2014-10-20 MED ORDER — ZOLPIDEM TARTRATE 5 MG PO TABS: 5.0000 mg | ORAL_TABLET | Freq: Every evening | ORAL | Status: DC | PRN

## 2014-10-20 MED ORDER — LEVETIRACETAM 750 MG PO TABS
750.0000 mg | ORAL_TABLET | Freq: Two times a day (BID) | ORAL | Status: DC
  Administered 2014-10-20 – 2014-10-21 (×2): 750 mg via ORAL
  Filled 2014-10-20 (×3): qty 1

## 2014-10-20 MED ORDER — ONDANSETRON HCL 4 MG PO TABS: 4.0000 mg | ORAL_TABLET | Freq: Three times a day (TID) | ORAL | Status: DC | PRN

## 2014-10-20 MED ORDER — LORAZEPAM 1 MG PO TABS: 1.0000 mg | ORAL_TABLET | Freq: Three times a day (TID) | ORAL | Status: DC | PRN

## 2014-10-20 MED ORDER — ALUM & MAG HYDROXIDE-SIMETH 200-200-20 MG/5ML PO SUSP: 30.0000 mL | ORAL | Status: DC | PRN

## 2014-10-20 MED ORDER — RISPERIDONE 0.5 MG PO TABS
0.5000 mg | ORAL_TABLET | Freq: Every day | ORAL | Status: DC
  Administered 2014-10-21: 0.5 mg via ORAL
  Filled 2014-10-20: qty 1
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
## 2014-10-20 NOTE — ED Notes (Signed)
Pt wanded by security. 
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
## 2014-10-20 NOTE — Telephone Encounter (Signed)
I have phoned in the Ativan prescription 90 pills no refills. I will refill the rest of these for 1 month but beyond that they need a new provider. I mentioned that to [facility].

## 2014-10-21 DIAGNOSIS — F840 Autistic disorder: Secondary | ICD-10-CM

## 2014-10-21 DIAGNOSIS — Z046 Encounter for general psychiatric examination, requested by authority: Secondary | ICD-10-CM | POA: Insufficient documentation

## 2014-10-21 LAB — RAPID URINE DRUG SCREEN, HOSP PERFORMED
Amphetamines: NOT DETECTED
BARBITURATES: NOT DETECTED
BENZODIAZEPINES: NOT DETECTED
Cocaine: NOT DETECTED
Opiates: NOT DETECTED
Tetrahydrocannabinol: NOT DETECTED

## 2014-10-21 MED ORDER — SERTRALINE HCL 100 MG PO TABS: 100.0000 mg | ORAL_TABLET | Freq: Every day | ORAL | Status: DC
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
## 2014-10-21 NOTE — Progress Notes (Signed)
CSW reached out to Ms.Earlene Plater to inform her that the patient is ready for discharge.   Ms.Davis states that she will be able to pick up the patient soon. She states that she will be to [facility] within the next few minutes. CSW made nurse aware. CSW made NP aware.  Ms.Davis [phone] [name] [phone] ED CSW 10/21/2014 5:05 PM

## 2014-10-21 NOTE — ED Notes (Signed)
Pt discharged to group home. DC instructions given to Ms. [facility], guardian of group home. Prescription x 1 given for zoloft. Pt Left unit in good condition steadily ambulating to checkout accompanied by guardian. No concerns voiced. Vwilliams, rn.

## 2014-10-21 NOTE — ED Notes (Signed)
Assumed care of pt. Pt asleep at this time. resp even and unlabored. Call bell within reach.

## 2014-10-21 NOTE — Progress Notes (Signed)
CSW reached out to patient's mother/guardian and informed her that [name] of [facility] will be coming to pick the patient up upon discharge this afternoon.   [name] [phone] ED CSW 10/21/2014 5:09 PM

## 2014-11-18 ENCOUNTER — Other Ambulatory Visit: Payer: Self-pay | Admitting: Psychiatry

## 2014-11-24 ENCOUNTER — Other Ambulatory Visit: Payer: Self-pay | Admitting: Psychiatry

## 2014-12-28 IMAGING — CT CT HEAD WITHOUT CONTRAST
1 series · 16 of 30 positions shown, 20 images · non-contrast
Comparison: none

REASON FOR EXAM: seizure
COMMENTS:   May transport without cardiac monitor

PROCEDURE:     CT  - CT HEAD WITHOUT CONTRAST  - November 23, 2012  [DATE]
RESULT:     History: Seizure.
Comparison Study: Head CT [DATE].

[Series 2: soft tissue · axial · 0.42mm/px · z∈[+353,+498]mm · 16 of 33 slices shown, 20 images]
[im 2/33  brain]
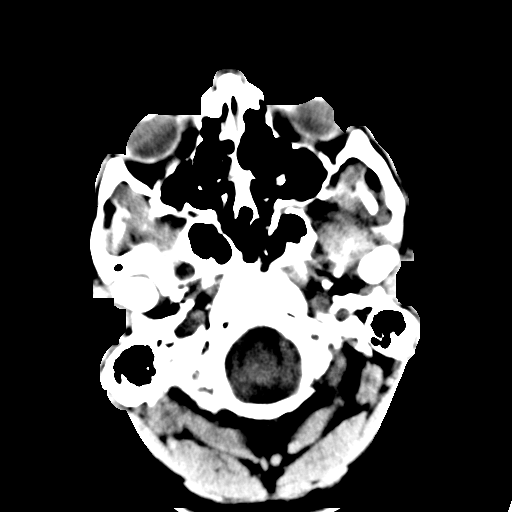
[im 2/33  bone]
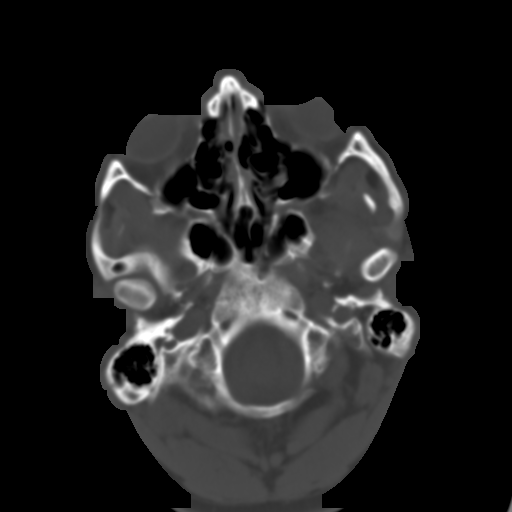
[im 4/33  brain]
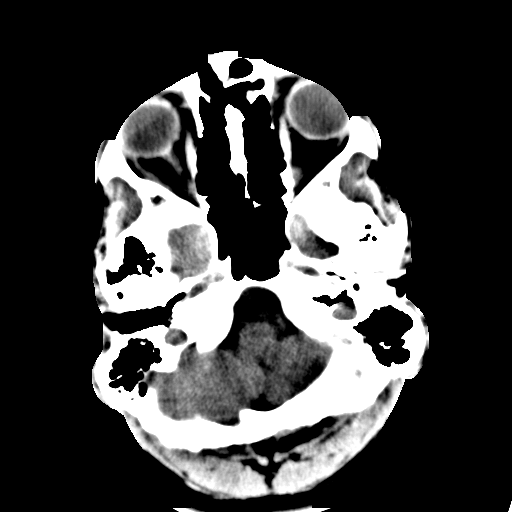
[im 6/33  brain]
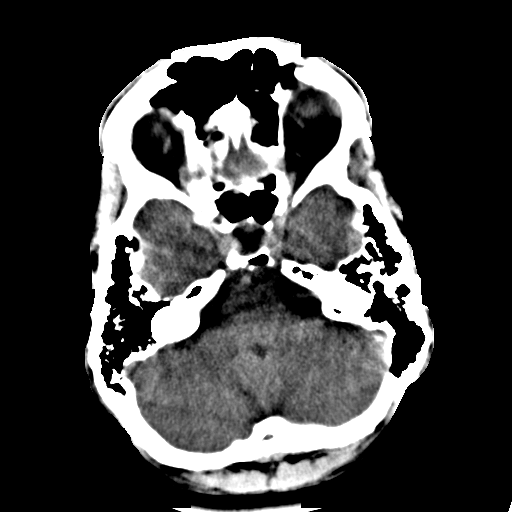
[im 8/33  brain]
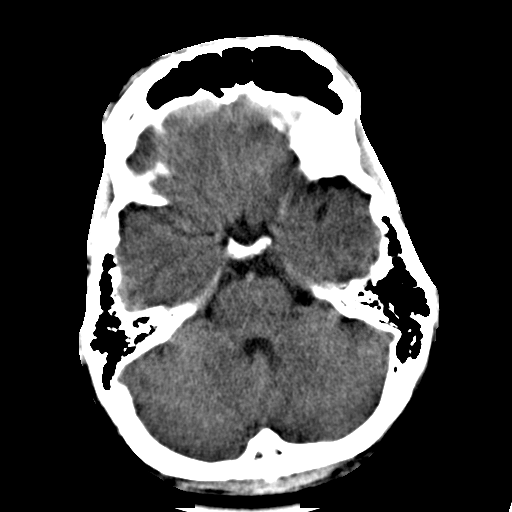
[im 9/33  brain]
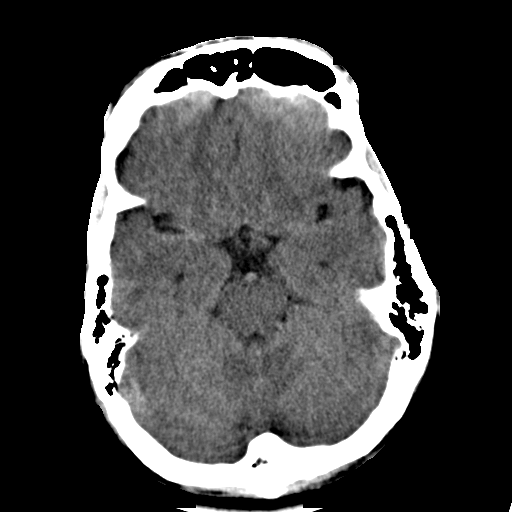
[im 9/33  bone]
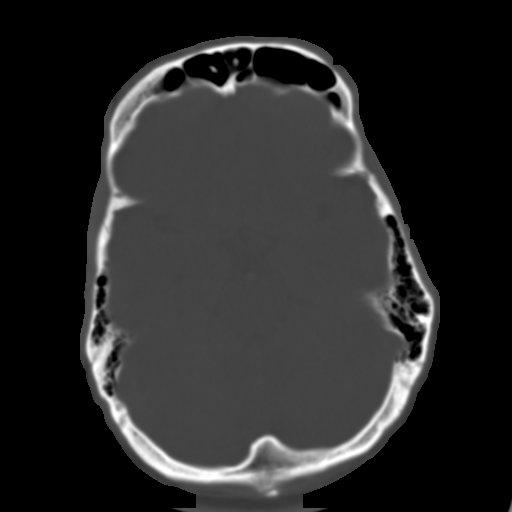
[im 12/33  brain]
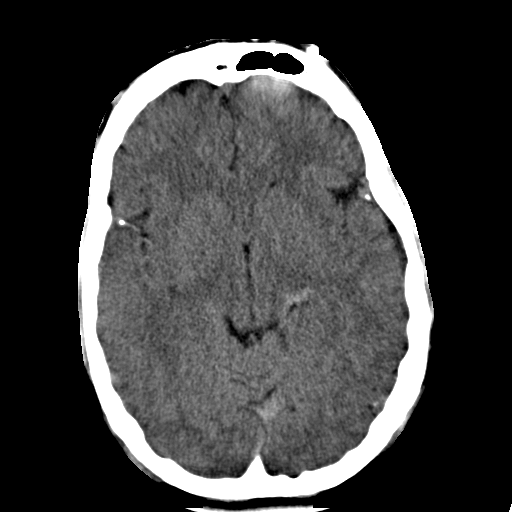
[im 14/33  brain]
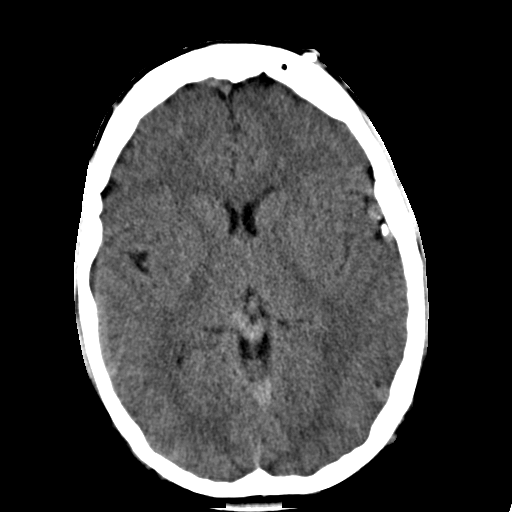
[im 16/33  brain]
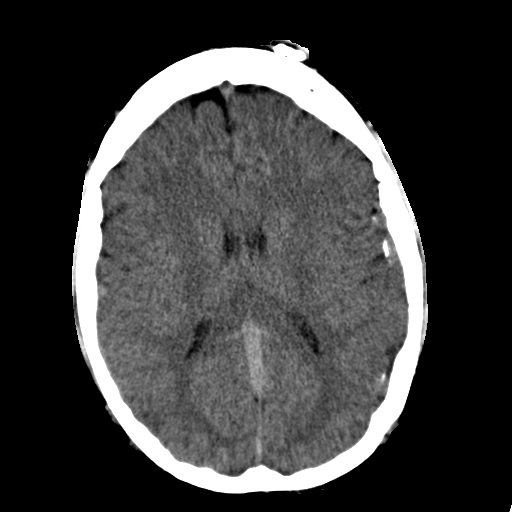
[im 17/33  brain]
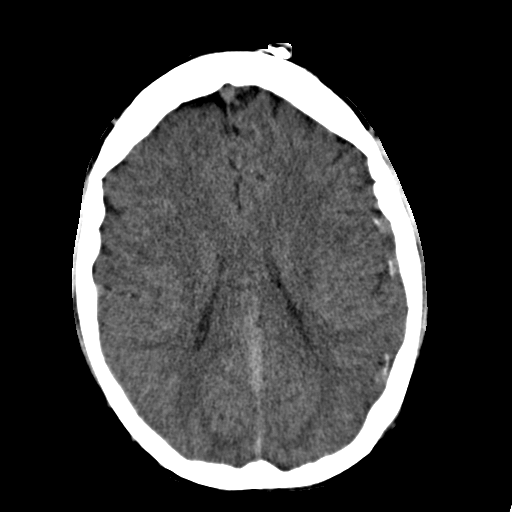
[im 17/33  bone]
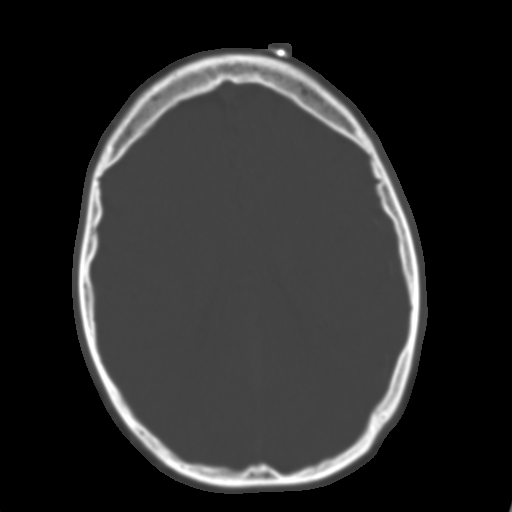
[im 19/33  brain]
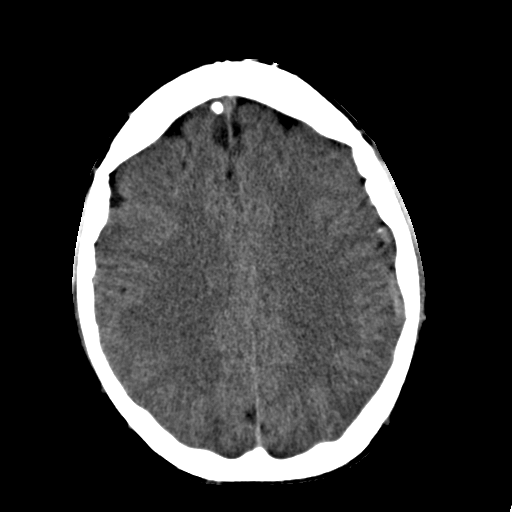
[im 21/33  brain]
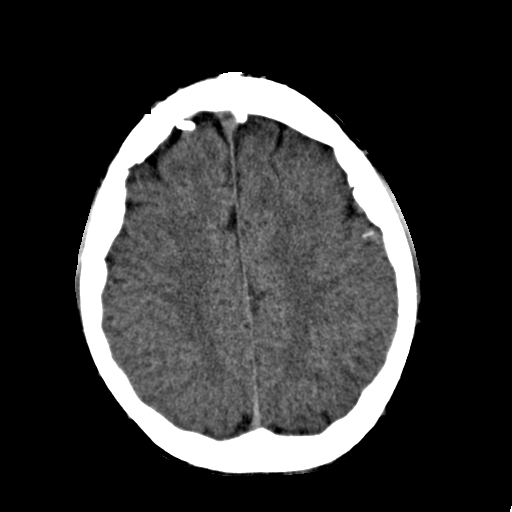
[im 24/33  brain]
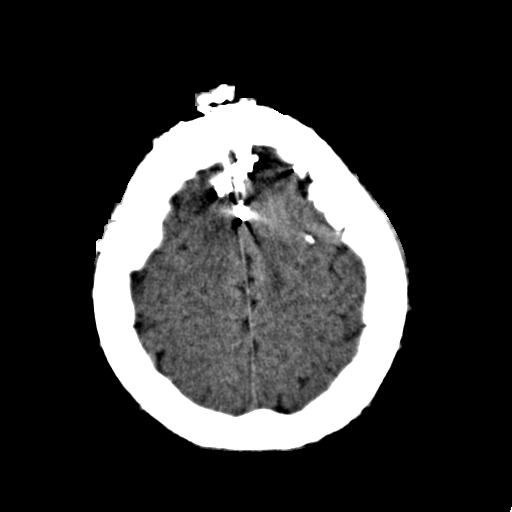
[im 25/33  brain]
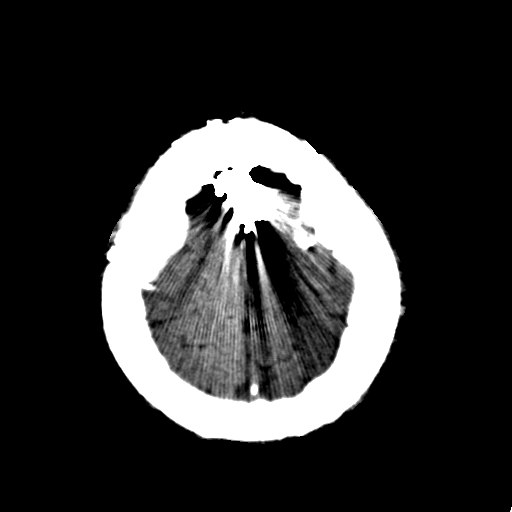
[im 25/33  bone]
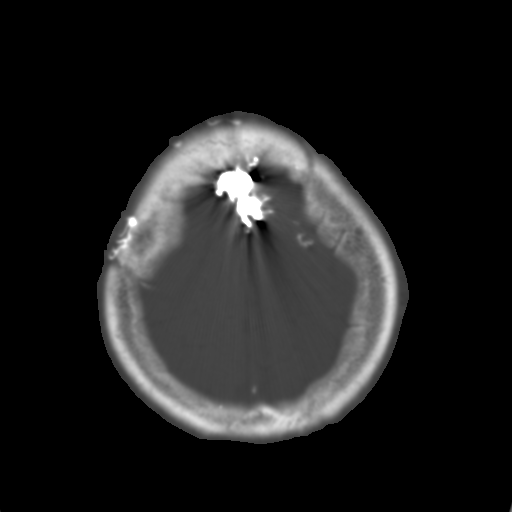
[im 27/33  brain]
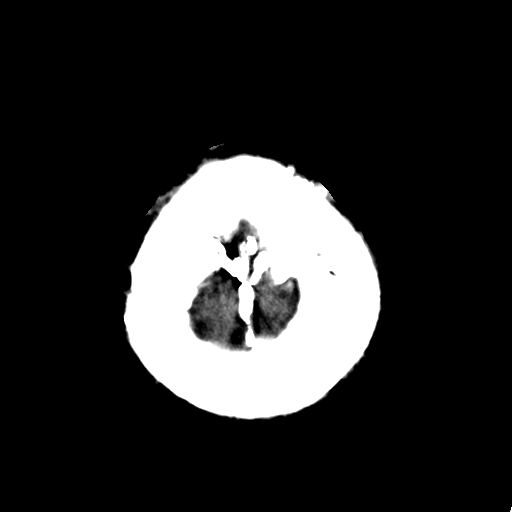
[im 29/33  brain]
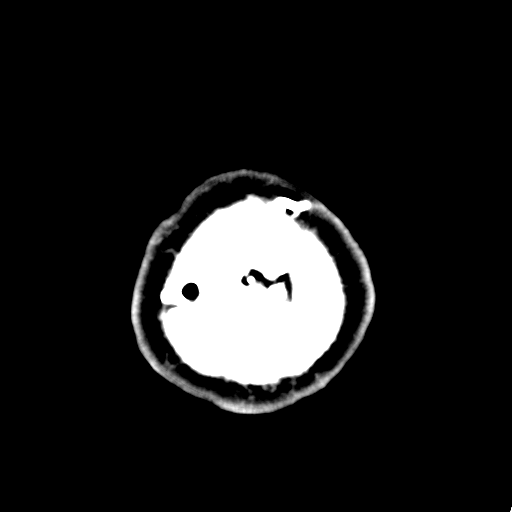
[im 31/33  brain]
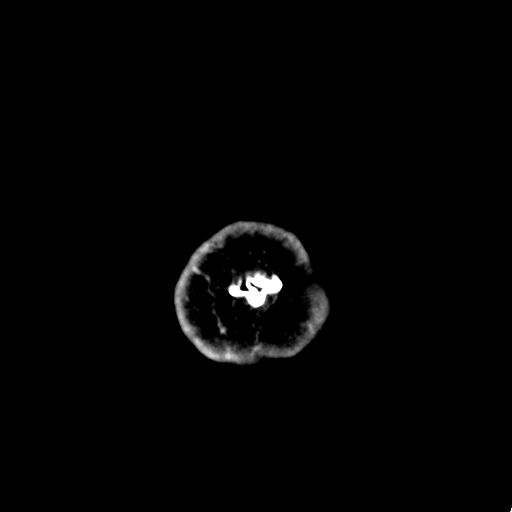

[16 of 30 positions shown; findings below may reference images not displayed]

FINDINGS: Standard nonenhanced CT obtained. No mass. No hydrocephalus. No
hemorrhage. No acute bony abnormality identified. Postsurgical changes noted
about the skull. Surgical wiring is noted. Paranasal sinuses are clear.
IMPRESSION: No acute abnormality.

## 2015-01-24 ENCOUNTER — Other Ambulatory Visit: Payer: Self-pay | Admitting: Psychiatry

## 2015-05-26 ENCOUNTER — Telehealth: Payer: Self-pay | Admitting: Family Medicine

## 2015-05-26 NOTE — Telephone Encounter (Signed)
Patient caregiver called requesting a refill on Kepra. Previous doctor for patient has passed. Patient has appointment 3/8 Please follow up

## 2015-05-26 NOTE — Telephone Encounter (Signed)
Routing to MD for decision

## 2015-05-27 NOTE — Telephone Encounter (Signed)
Doesn't seem to be a patient here; he needs to get established.

## 2015-06-02 ENCOUNTER — Encounter: Payer: Self-pay | Admitting: Family Medicine

## 2015-06-02 ENCOUNTER — Ambulatory Visit: Payer: Medicaid Other | Attending: Family Medicine | Admitting: Family Medicine

## 2015-06-02 VITALS — BP 119/74 | HR 68 | Temp 98.6°F | Resp 15 | Ht 65.0 in | Wt 157.0 lb

## 2015-06-02 DIAGNOSIS — Z131 Encounter for screening for diabetes mellitus: Secondary | ICD-10-CM | POA: Insufficient documentation

## 2015-06-02 DIAGNOSIS — R030 Elevated blood-pressure reading, without diagnosis of hypertension: Secondary | ICD-10-CM | POA: Diagnosis not present

## 2015-06-02 DIAGNOSIS — R569 Unspecified convulsions: Secondary | ICD-10-CM | POA: Diagnosis not present

## 2015-06-02 DIAGNOSIS — F200 Paranoid schizophrenia: Secondary | ICD-10-CM | POA: Diagnosis present

## 2015-06-02 DIAGNOSIS — IMO0001 Reserved for inherently not codable concepts without codable children: Secondary | ICD-10-CM

## 2015-06-02 DIAGNOSIS — F70 Mild intellectual disabilities: Secondary | ICD-10-CM | POA: Insufficient documentation

## 2015-06-02 DIAGNOSIS — Z79899 Other long term (current) drug therapy: Secondary | ICD-10-CM | POA: Diagnosis not present

## 2015-06-02 DIAGNOSIS — F79 Unspecified intellectual disabilities: Secondary | ICD-10-CM | POA: Diagnosis not present

## 2015-06-02 LAB — CBC WITH DIFFERENTIAL/PLATELET
BASOS PCT: 0 % (ref 0–1)
Basophils Absolute: 0 10*3/uL (ref 0.0–0.1)
EOS ABS: 0.2 10*3/uL (ref 0.0–0.7)
EOS PCT: 5 % (ref 0–5)
HCT: 44.8 % (ref 39.0–52.0)
Hemoglobin: 15.1 g/dL (ref 13.0–17.0)
Lymphocytes Relative: 36 % (ref 12–46)
Lymphs Abs: 1.7 10*3/uL (ref 0.7–4.0)
MCH: 27.8 pg (ref 26.0–34.0)
MCHC: 33.7 g/dL (ref 30.0–36.0)
MCV: 82.4 fL (ref 78.0–100.0)
MONO ABS: 0.3 10*3/uL (ref 0.1–1.0)
MPV: 9.5 fL (ref 8.6–12.4)
Monocytes Relative: 7 % (ref 3–12)
Neutro Abs: 2.5 10*3/uL (ref 1.7–7.7)
Neutrophils Relative %: 52 % (ref 43–77)
Platelets: 269 10*3/uL (ref 150–400)
RBC: 5.44 MIL/uL (ref 4.22–5.81)
RDW: 15.8 % — AB (ref 11.5–15.5)
WBC: 4.8 10*3/uL (ref 4.0–10.5)

## 2015-06-02 LAB — COMPLETE METABOLIC PANEL WITH GFR
ALT: 15 U/L (ref 9–46)
AST: 16 U/L (ref 10–40)
Albumin: 4.5 g/dL (ref 3.6–5.1)
Alkaline Phosphatase: 76 U/L (ref 40–115)
BUN: 15 mg/dL (ref 7–25)
CHLORIDE: 103 mmol/L (ref 98–110)
CO2: 26 mmol/L (ref 20–31)
Calcium: 9.3 mg/dL (ref 8.6–10.3)
Creat: 0.88 mg/dL (ref 0.60–1.35)
GFR, Est African American: >89 mL/min (ref >=60)
GFR, Est Non African American: >89 mL/min (ref >=60)
GLUCOSE: 85 mg/dL (ref 65–99)
POTASSIUM: 4.1 mmol/L (ref 3.5–5.3)
SODIUM: 138 mmol/L (ref 135–146)
Total Bilirubin: 0.4 mg/dL (ref 0.2–1.2)
Total Protein: 7.3 g/dL (ref 6.1–8.1)

## 2015-06-02 LAB — POCT GLYCOSYLATED HEMOGLOBIN (HGB A1C): HEMOGLOBIN A1C: 5.1

## 2015-06-02 MED ORDER — METOPROLOL TARTRATE 25 MG PO TABS: 25.0000 mg | ORAL_TABLET | Freq: Two times a day (BID) | ORAL | Status: DC

## 2015-06-02 MED ORDER — LEVETIRACETAM 750 MG PO TABS: 750.0000 mg | ORAL_TABLET | Freq: Two times a day (BID) | ORAL | Status: DC

## 2015-06-02 MED ORDER — CARBAMAZEPINE ER 400 MG PO TB12: 800.0000 mg | ORAL_TABLET | Freq: Two times a day (BID) | ORAL | Status: DC

## 2015-06-02 NOTE — Progress Notes (Signed)
Patient here to establish care His case worker is with him He states he is here simply foe medication refill

## 2015-06-02 NOTE — Progress Notes (Signed)
Subjective:  Patient ID: Brendan Gamble, male    DOB: 07/02/1990  Age: 25 y.o. MRN: [id number]  CC: Establish Care   HPI Brendan Gamble is a 25 year old [facility] with a history of mild mental retardation, seizures, schizophrenia who comes in to establish care and is accompanied by his caseworker. His previous PCP passed away and he is needing refills of his medications. [facility] health [facility].  The patient informs me he is doing well; has not had any seizures recently.  Outpatient Prescriptions Prior to Visit  Medication Sig Dispense Refill  . risperiDONE (RISPERDAL) 0.5 MG tablet Take 0.5 mg by mouth daily.    . sertraline (ZOLOFT) 100 MG tablet Take 1 tablet (100 mg total) by mouth daily. 30 tablet 0  . carbamazepine (TEGRETOL XR) 400 MG 12 hr tablet TAKE 2 TABLETS TWICE A DAY 60 tablet 0  . levETIRAcetam (KEPPRA) 750 MG tablet TAKE 1 TABLET TWICE A DAY 60 tablet 0  . metoprolol tartrate (LOPRESSOR) 25 MG tablet Take 25 mg by mouth 3 (three) times daily.    Marland Kitchen. LORazepam (ATIVAN) 0.5 MG tablet TAKE 1 TABLET EVERY 8 HOURS AS NEEDED (AGITATION) (Patient not taking: Reported on 10/20/2014) 90 tablet 0  . zonisamide (ZONEGRAN) 100 MG capsule TAKE 1 CAPSULE TWICE A DAY (Patient not taking: Reported on 06/02/2015) 60 capsule 0   No facility-administered medications prior to visit.    ROS Review of Systems  Constitutional: Negative for activity change and appetite change.  HENT: Negative for sinus pressure and sore throat.   Eyes: Negative for visual disturbance.  Respiratory: Negative for cough, chest tightness and shortness of breath.   Cardiovascular: Negative for chest pain and leg swelling.  Gastrointestinal: Negative for abdominal pain, diarrhea, constipation and abdominal distention.  Endocrine: Negative.   Genitourinary: Negative for dysuria.  Musculoskeletal: Negative for myalgias and joint swelling.  Skin: Negative for rash.    Allergic/Immunologic: Negative.   Neurological: Negative for weakness, light-headedness and numbness.  Psychiatric/Behavioral: Negative for suicidal ideas and dysphoric mood.     Objective:  BP 119/74 mmHg  Pulse 68  Temp(Src) 98.6 F (37 C)  Resp 15  Ht 5\' 5"  (1.651 m)  Wt 157 lb (71.215 kg)  BMI 26.13 kg/m2  SpO2 99%  BP/Weight 06/02/2015 10/21/2014 09/18/2014  Systolic BP 119 95 114  Diastolic BP 74 43 73  Wt. (Lbs) 157 - -  BMI 26.13 - -      Physical Exam  Constitutional: He is oriented to person, place, and time. He appears well-developed and well-nourished.  Cardiovascular: Normal rate, normal heart sounds and intact distal pulses.   No murmur heard. Pulmonary/Chest: Effort normal and breath sounds normal. He has no wheezes. He has no rales. He exhibits no tenderness.  Abdominal: Soft. Bowel sounds are normal. He exhibits no distension and no mass. There is no tenderness.  Musculoskeletal: Normal range of motion.  Neurological: He is alert and oriented to person, place, and time.  Psychiatric:  Mild MR     Assessment & Plan:   1. Diabetes mellitus screening 5.1 - HgB A1c  2. Paranoid schizophrenia (HCC) Continue Risperidal, Zoloft Followed by [facility]  3. Seizures (HCC) No recent seizures - levETIRAcetam (KEPPRA) 750 MG tablet; Take 1 tablet (750 mg total) by mouth 2 (two) times daily.  Dispense: 60 tablet; Refill: 3 - carbamazepine (TEGRETOL XR) 400 MG 12 hr tablet; Take 2 tablets (800 mg  total) by mouth 2 (two) times daily.  Dispense: 60 tablet; Refill: 3 - CBC with Differential/Platelet - COMPLETE METABOLIC PANEL WITH GFR - Carbamazepine level, total  4. Mental retardation Mild   5. Elevated blood pressure Controlled - metoprolol tartrate (LOPRESSOR) 25 MG tablet; Take 1 tablet (25 mg total) by mouth 2 (two) times daily.  Dispense: 60 tablet; Refill: 3   Meds ordered this encounter  Medications  . levETIRAcetam (KEPPRA) 750 MG tablet    Sig:  Take 1 tablet (750 mg total) by mouth 2 (two) times daily.    Dispense:  60 tablet    Refill:  3  . carbamazepine (TEGRETOL XR) 400 MG 12 hr tablet    Sig: Take 2 tablets (800 mg total) by mouth 2 (two) times daily.    Dispense:  60 tablet    Refill:  3  . metoprolol tartrate (LOPRESSOR) 25 MG tablet    Sig: Take 1 tablet (25 mg total) by mouth 2 (two) times daily.    Dispense:  60 tablet    Refill:  3    Follow-up: Return in about 3 months (around 09/02/2015) for follow up on seizures.   Jaclyn Shaggy MD

## 2015-06-02 NOTE — Addendum Note (Signed)
Addended by: Dorathy DaftSMYTHE, Sarayu Prevost Q on: 06/02/2015 12:00 PM   Modules accepted: Orders, Medications

## 2015-06-03 ENCOUNTER — Telehealth: Payer: Self-pay | Admitting: *Deleted

## 2015-06-03 LAB — CARBAMAZEPINE LEVEL, TOTAL: CARBAMAZEPINE LVL: 12.2 mg/L — AB (ref 4.0–12.0)

## 2015-06-03 NOTE — Telephone Encounter (Signed)
Left message to return call to clinic-number given

## 2015-06-03 NOTE — Telephone Encounter (Signed)
-----   Message from Jaclyn ShaggyEnobong Amao, MD sent at 06/03/2015  9:28 AM EST ----- Mildly elevated carbamazepine level; no regimen changes for now.

## 2015-06-07 ENCOUNTER — Telehealth: Payer: Self-pay | Admitting: *Deleted

## 2015-06-07 NOTE — Telephone Encounter (Signed)
Second call to give results.  Message left

## 2015-06-08 NOTE — Telephone Encounter (Signed)
Gave results to patient's caregiver.  She will come pick up the completed FL2 form.  RN placed it at the front desk.

## 2015-08-16 ENCOUNTER — Other Ambulatory Visit: Payer: Self-pay | Admitting: Family Medicine

## 2015-08-16 DIAGNOSIS — R569 Unspecified convulsions: Secondary | ICD-10-CM

## 2015-08-16 MED ORDER — CARBAMAZEPINE ER 400 MG PO TB12: 800.0000 mg | ORAL_TABLET | Freq: Two times a day (BID) | ORAL | Status: DC

## 2015-09-09 ENCOUNTER — Other Ambulatory Visit: Payer: Self-pay | Admitting: Pharmacist

## 2015-09-09 DIAGNOSIS — R030 Elevated blood-pressure reading, without diagnosis of hypertension: Principal | ICD-10-CM

## 2015-09-09 DIAGNOSIS — R569 Unspecified convulsions: Secondary | ICD-10-CM

## 2015-09-09 DIAGNOSIS — IMO0001 Reserved for inherently not codable concepts without codable children: Secondary | ICD-10-CM

## 2015-09-09 MED ORDER — LEVETIRACETAM 750 MG PO TABS: 750.0000 mg | ORAL_TABLET | Freq: Two times a day (BID) | ORAL | Status: DC

## 2015-09-09 MED ORDER — METOPROLOL TARTRATE 25 MG PO TABS: 25.0000 mg | ORAL_TABLET | Freq: Two times a day (BID) | ORAL | Status: DC

## 2015-09-10 ENCOUNTER — Ambulatory Visit: Payer: Medicaid Other | Attending: Family Medicine | Admitting: Family Medicine

## 2015-09-10 ENCOUNTER — Encounter: Payer: Self-pay | Admitting: Family Medicine

## 2015-09-10 VITALS — BP 122/77 | HR 72 | Temp 97.4°F | Resp 14 | Ht 64.0 in | Wt 161.8 lb

## 2015-09-10 DIAGNOSIS — F200 Paranoid schizophrenia: Secondary | ICD-10-CM

## 2015-09-10 DIAGNOSIS — F79 Unspecified intellectual disabilities: Secondary | ICD-10-CM | POA: Diagnosis not present

## 2015-09-10 DIAGNOSIS — F17210 Nicotine dependence, cigarettes, uncomplicated: Secondary | ICD-10-CM | POA: Diagnosis not present

## 2015-09-10 DIAGNOSIS — F840 Autistic disorder: Secondary | ICD-10-CM | POA: Diagnosis not present

## 2015-09-10 DIAGNOSIS — F17200 Nicotine dependence, unspecified, uncomplicated: Secondary | ICD-10-CM | POA: Insufficient documentation

## 2015-09-10 DIAGNOSIS — Z79899 Other long term (current) drug therapy: Secondary | ICD-10-CM | POA: Insufficient documentation

## 2015-09-10 DIAGNOSIS — F6089 Other specific personality disorders: Secondary | ICD-10-CM

## 2015-09-10 DIAGNOSIS — Z13228 Encounter for screening for other metabolic disorders: Secondary | ICD-10-CM | POA: Diagnosis not present

## 2015-09-10 DIAGNOSIS — R569 Unspecified convulsions: Secondary | ICD-10-CM | POA: Insufficient documentation

## 2015-09-10 DIAGNOSIS — R4689 Other symptoms and signs involving appearance and behavior: Secondary | ICD-10-CM | POA: Insufficient documentation

## 2015-09-10 LAB — CBC WITH DIFFERENTIAL/PLATELET
BASOS ABS: 0 {cells}/uL (ref 0–200)
BASOS PCT: 0 %
EOS PCT: 4 %
Eosinophils Absolute: 168 cells/uL (ref 15–500)
HCT: 45.2 % (ref 38.5–50.0)
HEMOGLOBIN: 15.2 g/dL (ref 13.2–17.1)
LYMPHS ABS: 1386 {cells}/uL (ref 850–3900)
Lymphocytes Relative: 33 %
MCH: 28.7 pg (ref 27.0–33.0)
MCHC: 33.6 g/dL (ref 32.0–36.0)
MCV: 85.4 fL (ref 80.0–100.0)
MPV: 9.4 fL (ref 7.5–12.5)
Monocytes Absolute: 420 cells/uL (ref 200–950)
Monocytes Relative: 10 %
NEUTROS ABS: 2226 {cells}/uL (ref 1500–7800)
Neutrophils Relative %: 53 %
PLATELETS: 236 10*3/uL (ref 140–400)
RBC: 5.29 MIL/uL (ref 4.20–5.80)
RDW: 15.5 % — ABNORMAL HIGH (ref 11.0–15.0)
WBC: 4.2 10*3/uL (ref 3.8–10.8)

## 2015-09-10 LAB — COMPLETE METABOLIC PANEL WITH GFR
ALK PHOS: 86 U/L (ref 40–115)
ALT: 12 U/L (ref 9–46)
AST: 17 U/L (ref 10–40)
Albumin: 4.3 g/dL (ref 3.6–5.1)
BUN: 9 mg/dL (ref 7–25)
CO2: 25 mmol/L (ref 20–31)
CREATININE: 0.71 mg/dL (ref 0.60–1.35)
Calcium: 8.8 mg/dL (ref 8.6–10.3)
Chloride: 101 mmol/L (ref 98–110)
GFR, Est African American: >89 mL/min (ref >=60)
GFR, Est Non African American: >89 mL/min (ref >=60)
GLUCOSE: 95 mg/dL (ref 65–99)
Potassium: 3.9 mmol/L (ref 3.5–5.3)
SODIUM: 135 mmol/L (ref 135–146)
TOTAL PROTEIN: 7.1 g/dL (ref 6.1–8.1)
Total Bilirubin: 0.3 mg/dL (ref 0.2–1.2)

## 2015-09-10 MED ORDER — LORAZEPAM 0.5 MG PO TABS: 0.5000 mg | ORAL_TABLET | Freq: Every day | ORAL | Status: AC
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
## 2015-09-10 NOTE — Progress Notes (Signed)
Pt here for F/U for siezures. Pt denies pain. Pt has taken medications today.

## 2015-09-10 NOTE — Patient Instructions (Signed)
Smoking Cessation, Tips for Success If you are ready to quit smoking, congratulations! You have chosen to help yourself be healthier. Cigarettes bring nicotine, tar, carbon monoxide, and other irritants into your body. Your lungs, heart, and blood vessels will be able to work better without these poisons. There are many different ways to quit smoking. Nicotine gum, nicotine patches, a nicotine inhaler, or nicotine nasal spray can help with physical craving. Hypnosis, support groups, and medicines help break the habit of smoking. WHAT THINGS CAN I DO TO MAKE QUITTING EASIER?  Here are some tips to help you quit for good:  Pick a date when you will quit smoking completely. Tell all of your friends and family about your plan to quit on that date.  Do not try to slowly cut down on the number of cigarettes you are smoking. Pick a quit date and quit smoking completely starting on that day.  Throw away all cigarettes.   Clean and remove all ashtrays from your home, work, and car.  On a card, write down your reasons for quitting. Carry the card with you and read it when you get the urge to smoke.  Cleanse your body of nicotine. Drink enough water and fluids to keep your urine clear or pale yellow. Do this after quitting to flush the nicotine from your body.  Learn to predict your moods. Do not let a bad situation be your excuse to have a cigarette. Some situations in your life might tempt you into wanting a cigarette.  Never have "just one" cigarette. It leads to wanting another and another. Remind yourself of your decision to quit.  Change habits associated with smoking. If you smoked while driving or when feeling stressed, try other activities to replace smoking. Stand up when drinking your coffee. Brush your teeth after eating. Sit in a different chair when you read the paper. Avoid alcohol while trying to quit, and try to drink fewer caffeinated beverages. Alcohol and caffeine may urge you to  smoke.  Avoid foods and drinks that can trigger a desire to smoke, such as sugary or spicy foods and alcohol.  Ask people who smoke not to smoke around you.  Have something planned to do right after eating or having a cup of coffee. For example, plan to take a walk or exercise.  Try a relaxation exercise to calm you down and decrease your stress. Remember, you may be tense and nervous for the first 2 weeks after you quit, but this will pass.  Find new activities to keep your hands busy. Play with a pen, coin, or rubber band. Doodle or draw things on paper.  Brush your teeth right after eating. This will help cut down on the craving for the taste of tobacco after meals. You can also try mouthwash.   Use oral substitutes in place of cigarettes. Try using lemon drops, carrots, cinnamon sticks, or chewing gum. Keep them handy so they are available when you have the urge to smoke.  When you have the urge to smoke, try deep breathing.  Designate your home as a nonsmoking area.  If you are a heavy smoker, ask your health care provider about a prescription for nicotine chewing gum. It can ease your withdrawal from nicotine.  Reward yourself. Set aside the cigarette money you save and buy yourself something nice.  Look for support from others. Join a support group or smoking cessation program. Ask someone at home or at work to help you with your plan   to quit smoking.  Always ask yourself, "Do I need this cigarette or is this just a reflex?" Tell yourself, "Today, I choose not to smoke," or "I do not want to smoke." You are reminding yourself of your decision to quit.  Do not replace cigarette smoking with electronic cigarettes (commonly called e-cigarettes). The safety of e-cigarettes is unknown, and some may contain harmful chemicals.  If you relapse, do not give up! Plan ahead and think about what you will do the next time you get the urge to smoke. HOW WILL I FEEL WHEN I QUIT SMOKING? You  may have symptoms of withdrawal because your body is used to nicotine (the addictive substance in cigarettes). You may crave cigarettes, be irritable, feel very hungry, cough often, get headaches, or have difficulty concentrating. The withdrawal symptoms are only temporary. They are strongest when you first quit but will go away within 10-14 days. When withdrawal symptoms occur, stay in control. Think about your reasons for quitting. Remind yourself that these are signs that your body is healing and getting used to being without cigarettes. Remember that withdrawal symptoms are easier to treat than the major diseases that smoking can cause.  Even after the withdrawal is over, expect periodic urges to smoke. However, these cravings are generally short lived and will go away whether you smoke or not. Do not smoke! WHAT RESOURCES ARE AVAILABLE TO HELP ME QUIT SMOKING? Your health care provider can direct you to community resources or hospitals for support, which may include:  Group support.  Education.  Hypnosis.  Therapy.   This information is not intended to replace advice given to you by your health care provider. Make sure you discuss any questions you have with your health care provider.   Document Released: 12/10/2003 Document Revised: 04/03/2014 Document Reviewed: 08/29/2012 Elsevier Interactive Patient Education 2016 Elsevier Inc.  

## 2015-09-11 LAB — CARBAMAZEPINE LEVEL, TOTAL: CARBAMAZEPINE LVL: 10.4 mg/L (ref 4.0–12.0)

## 2015-09-13 ENCOUNTER — Telehealth: Payer: Self-pay

## 2015-09-13 NOTE — Telephone Encounter (Signed)
-----   Message from Brendan Amao, MD sent at 09/13/2015  8:42 AM EDT ----- Please inform the patient that labs are normal. Thank you. 

## 2015-09-15 ENCOUNTER — Telehealth: Payer: Self-pay

## 2015-09-15 NOTE — Telephone Encounter (Signed)
-----   Message from Jaclyn ShaggyEnobong Amao, MD sent at 09/13/2015  8:42 AM EDT ----- Please inform the patient that labs are normal. Thank you.

## 2015-09-15 NOTE — Telephone Encounter (Signed)
Through [employer] patient was contacted.  There was no VM - unable to leave a message.

## 2015-10-05 ENCOUNTER — Telehealth: Payer: Self-pay | Admitting: Family Medicine

## 2015-10-05 NOTE — Telephone Encounter (Signed)
Patient needs respiradol sent to [facility].

## 2015-10-05 NOTE — Telephone Encounter (Signed)
Returned phone call from Lupita LeashDonna Art therapist(worker at [facility]) and let her know that we do not order risperidone here and that she needs to request refills from [facility]. She verbalized understanding and will contact [facility] for refills.

## 2015-10-14 ENCOUNTER — Telehealth: Payer: Self-pay | Admitting: Family Medicine

## 2015-10-14 ENCOUNTER — Ambulatory Visit (INDEPENDENT_AMBULATORY_CARE_PROVIDER_SITE_OTHER): Payer: Medicaid Other | Admitting: Neurology

## 2015-10-14 ENCOUNTER — Encounter: Payer: Self-pay | Admitting: Neurology

## 2015-10-14 VITALS — BP 110/73 | HR 75 | Ht 64.0 in | Wt 161.2 lb

## 2015-10-14 DIAGNOSIS — F200 Paranoid schizophrenia: Secondary | ICD-10-CM | POA: Diagnosis not present

## 2015-10-14 DIAGNOSIS — R569 Unspecified convulsions: Secondary | ICD-10-CM | POA: Diagnosis not present

## 2015-10-14 NOTE — Telephone Encounter (Signed)
Scientist, physiologicalCarla state surveyor called called requesting to speak with pt. Nurse regarding the Rx of carbamazepine (TEGRETOL XR) 400 MG 12 hr tablet . Rep needs clarification on the medication and has some other questions to ask the nurse. Rep also stated that she would only be with the pt. For a couple  More hours and would like a call ASAP. Please f/u

## 2015-10-14 NOTE — Progress Notes (Signed)
Reason for visit: Seizures  Referring physician: Dr. Gillermina Hu is a 25 y.o. male  History of present illness:  Mr. Bertran is a 25 year old right-handed male with a history of mental retardation, schizophrenia, and a history of seizures. The patient is living in a group home, [facility], and he is on carbamazepine for seizures. The patient himself is unable to give an adequate history concerning his seizures. According to the referring physician's note, he has not had a seizure in greater than one year. The patient does not give any history about any aura to the seizures, he is unaware about any symptoms around the time of the seizure. He indicates that before he moved to his current residence, he was having seizures more frequently. He has had recent blood work done, a carbamazepine level was 10.4. The patient has had CT scan evaluation of the brain in the past, the last recorded in 2014, this was unremarkable. The patient reports any difficulty with headaches, numbness or weakness on the extremities, he does report some occasional dizziness. He denies any falls. He is sent to this office for further evaluation.  Past Medical History  Diagnosis Date  . Autism   . Seizures (HCC)   . Hypertension     History reviewed. No pertinent past surgical history.  History reviewed. No pertinent family history.  Social history:  reports that he has been smoking.  He does not have any smokeless tobacco history on file. He reports that he does not drink alcohol or use illicit drugs.  Medications:  Prior to Admission medications   Medication Sig Start Date End Date Taking? Authorizing Provider  carbamazepine (TEGRETOL XR) 400 MG 12 hr tablet Take 2 tablets (800 mg total) by mouth 2 (two) times daily. 08/16/15  Yes Jaclyn Shaggy, MD  levETIRAcetam (KEPPRA) 750 MG tablet Take 1 tablet (750 mg total) by mouth 2 (two) times daily. 09/09/15  Yes Jaclyn Shaggy, MD  LORazepam (ATIVAN) 0.5 MG tablet  Take 1 tablet (0.5 mg total) by mouth at bedtime. As needed for agitation 09/10/15  Yes Jaclyn Shaggy, MD  metoprolol tartrate (LOPRESSOR) 25 MG tablet Take 1 tablet (25 mg total) by mouth 2 (two) times daily. 09/09/15  Yes Jaclyn Shaggy, MD  risperiDONE (RISPERDAL) 0.5 MG tablet Take 0.5 mg by mouth daily.   Yes Historical Provider, MD  sertraline (ZOLOFT) 100 MG tablet Take 200 mg by mouth daily.   Yes Historical Provider, MD     No Known Allergies  ROS:  Out of a complete 14 system review of symptoms, the patient complains only of the following symptoms, and all other reviewed systems are negative.  Seizures  Blood pressure 110/73, pulse 75, height  (1.626 m), weight 161 lb 4 oz (73.143 kg).  Physical Exam  General: The patient is alert and cooperative at the time of the examination.  Eyes: Pupils are equal, round, and reactive to light. Discs are flat bilaterally.  Neck: The neck is supple, no carotid bruits are noted.  Respiratory: The respiratory examination is clear.  Cardiovascular: The cardiovascular examination reveals a regular rate and rhythm, no obvious murmurs or rubs are noted.  Skin: Extremities are without significant edema.  Neurologic Exam  Mental status: The patient is alert and oriented x 2 name and place at the time of examination.  Cranial nerves: Facial symmetry is present. There is good sensation of the face to pinprick and soft touch bilaterally. The strength of the facial  muscles and the muscles to head turning and shoulder shrug are normal bilaterally. Speech is well enunciated, no aphasia or dysarthria is noted. Extraocular movements are full. Visual fields are full. The tongue is midline, and the patient has symmetric elevation of the soft palate. No obvious hearing deficits are noted.  Motor: The motor testing reveals 5 over 5 strength of all 4 extremities. Good symmetric motor tone is noted throughout.  Sensory: Sensory testing is intact to  pinprick, soft touch, vibration sensation, and position sense on all 4 extremities. No evidence of extinction is noted.  Coordination: Cerebellar testing reveals good finger-nose-finger and heel-to-shin bilaterally.  Gait and station: Gait is slightly wide-based. Tandem gait is normal. Romberg is negative. No drift is seen.  Reflexes: Deep tendon reflexes are symmetric and normal bilaterally. Toes are downgoing bilaterally.   Assessment/Plan:  1. Mental retardation  2. Schizophrenia  3. History of seizures  The patient has done relatively well with seizure control according to the history provided. The patient comes to the office today with a driver who does not know anything about the patient per se. We have attempted to contact the group home, no answer was obtained on telephone calls. At this point, it appears that the patient is well controlled, he has adequate blood levels of carbamazepine, I will not readjust the dose. The patient will follow-up in one year, sooner if needed.  Marlan Palau. Keith Liberta Gimpel MD 10/14/2015 8:27 PM  [facility] [address] [place], [place] [id number]  Phone [phone] Fax [phone]

## 2015-10-14 NOTE — Patient Instructions (Signed)

## 2015-10-14 NOTE — Telephone Encounter (Signed)
Albin FellingCarla calling again requesting to speak with pt's nurse.  States she is performing an annual inspection of [facility] and needs assistance from Nurse  Albin FellingCarla requesting to be spoken with within the next hour due to her leaving said facility soon Please assist, thank you

## 2015-10-15 ENCOUNTER — Telehealth: Payer: Self-pay

## 2015-10-15 NOTE — Telephone Encounter (Signed)
His last prescription is for Tegretol-XR 400 mg, 2 tablets twice daily. He was referred to neurology and subsequent refills should come from his neurologist.

## 2015-10-15 NOTE — Telephone Encounter (Signed)
Brendan Gamble called states she needs medical advise for patient not taking seizure medications, states pt has not been taking medication and is having side effects and she needs advise. Please f/u ASAP

## 2015-10-15 NOTE — Telephone Encounter (Signed)
RN advised per Dr. Venetia NightAmao: His last prescription is for Tegretol-XR 400 mg, 2 tablets twice daily. He was referred to neurology and subsequent refills should come from his neurologist.

## 2015-10-15 NOTE — Telephone Encounter (Signed)
Contacted Lafonda MossesDiana back regarding [facility] and she wanted to confirm if you received a fax regarding pt because he is not receiving his medication while in the home and she wanted to know how would you feel if he is not receiving medication because she is doing an investigation on the rest home

## 2015-10-18 ENCOUNTER — Telehealth: Payer: Self-pay | Admitting: Neurology

## 2015-10-18 NOTE — Telephone Encounter (Signed)
How I would feel if he does not receive his medications? I do not understand that question. He receives prescriptions at his office visits with me and it is the responsibility of his caregivers to administer them.

## 2015-10-18 NOTE — Telephone Encounter (Signed)
Tegretol is currently ordered... carbamazepine (TEGRETOL XR) 400 MG 12 hr tablet Take 2 tablets (800 mg total) by mouth 2 (two) times daily.   Level drawn in June was w/i therapeutic range... Carbamazepine Lvl 4.0 - 12.0 mg/L 10.4

## 2015-10-18 NOTE — Telephone Encounter (Signed)
Called and spoke to caregiver Kathie Rhodes). She reports that pt is taking Tegretol 400 mg 2 tabs twice a day. Dr. Anne Hahn did not make any dosage changes at last appt as levels were therapeutic. Caregiver requested that updated medication list be printed. Med list up front for pick-up.

## 2015-10-18 NOTE — Telephone Encounter (Signed)
Brendan Gamble is asking questions about the patient's tegretol: Where you aware patient is not receiving his medication as ordered. He has not received 50 tablets of his tergretol in the last 2 months How serious do you feel this is? What are the negative outcomes of not taking this medication correctly? What is the doctor's expectation concerning the med. In reference to the facility giving it? Could you please call by 1:30?

## 2015-10-26 ENCOUNTER — Telehealth: Payer: Self-pay | Admitting: Family Medicine

## 2015-10-26 NOTE — Telephone Encounter (Signed)
Brendan Gamble returned nurses call. Please f/u

## 2015-10-26 NOTE — Telephone Encounter (Signed)
Clld Charlena Cross(?) [phone] - LMOVMTC re concerns of pt not receiving his medication in the home.

## 2015-10-26 NOTE — Telephone Encounter (Signed)
Returned call to  Morton Stall, Adult Care licensure consultant for the [employer] [place]. She states her responsibility is to ensure residents/patients are being taken care of accordingly.   Per Lafonda Mosses, in doing annual survey of [facility], patient was found to not have been administered Tegretol as prescribed. Lafonda Mosses found #50 tablets left in a two month period.   Lafonda Mosses would like to know the side effects of this medication to the patient due to him not taking medication as prescribed as well as if there was any notification of medication not being administered as prescribed.     Per Lafonda Mosses, she can be reached at [phone], this is her work phone and a secure line where a detailed message can be left.  Will forward message to PCP.

## 2015-10-27 NOTE — Telephone Encounter (Signed)
I was not notified of him not taking his medications at his last visit. Not taking his antiseizure medications put him at risk for seizures.

## 2015-10-27 NOTE — Telephone Encounter (Signed)
Clld Lafonda Mosses - advsd of PCP notation regarding risk for pt not taking his medication nor was she notified.

## 2015-10-29 ENCOUNTER — Other Ambulatory Visit: Payer: Self-pay | Admitting: Pharmacist

## 2015-10-29 DIAGNOSIS — R569 Unspecified convulsions: Secondary | ICD-10-CM

## 2015-10-29 DIAGNOSIS — IMO0001 Reserved for inherently not codable concepts without codable children: Secondary | ICD-10-CM

## 2015-10-29 DIAGNOSIS — R030 Elevated blood-pressure reading, without diagnosis of hypertension: Secondary | ICD-10-CM

## 2015-10-29 MED ORDER — LEVETIRACETAM 750 MG PO TABS: 750.0000 mg | ORAL_TABLET | Freq: Two times a day (BID) | ORAL | 2 refills | Status: DC

## 2015-10-29 MED ORDER — METOPROLOL TARTRATE 25 MG PO TABS: 25.0000 mg | ORAL_TABLET | Freq: Two times a day (BID) | ORAL | 2 refills | Status: DC

## 2015-11-05 ENCOUNTER — Other Ambulatory Visit: Payer: Self-pay | Admitting: Pharmacist

## 2015-11-05 DIAGNOSIS — R569 Unspecified convulsions: Secondary | ICD-10-CM

## 2015-11-05 MED ORDER — CARBAMAZEPINE ER 400 MG PO TB12: 800.0000 mg | ORAL_TABLET | Freq: Two times a day (BID) | ORAL | 3 refills | Status: DC

## 2015-12-02 ENCOUNTER — Other Ambulatory Visit: Payer: Self-pay | Admitting: Pharmacist

## 2015-12-02 DIAGNOSIS — R569 Unspecified convulsions: Secondary | ICD-10-CM

## 2015-12-02 MED ORDER — CARBAMAZEPINE ER 400 MG PO TB12: 800.0000 mg | ORAL_TABLET | Freq: Two times a day (BID) | ORAL | 3 refills | Status: DC

## 2015-12-07 ENCOUNTER — Ambulatory Visit: Payer: Self-pay

## 2016-10-19 ENCOUNTER — Ambulatory Visit: Payer: Medicaid Other | Admitting: Adult Health

## 2016-10-20 ENCOUNTER — Encounter: Payer: Self-pay | Admitting: Adult Health

## 2016-12-26 ENCOUNTER — Emergency Department
Admission: EM | Admit: 2016-12-26 | Discharge: 2017-01-09 | Disposition: A | Discharge status: Home / Self Care | Payer: Medicaid Other | Attending: Emergency Medicine | Admitting: Emergency Medicine

## 2016-12-26 ENCOUNTER — Encounter: Payer: Self-pay | Admitting: *Deleted

## 2016-12-26 DIAGNOSIS — F17200 Nicotine dependence, unspecified, uncomplicated: Secondary | ICD-10-CM | POA: Insufficient documentation

## 2016-12-26 DIAGNOSIS — F840 Autistic disorder: Secondary | ICD-10-CM | POA: Diagnosis not present

## 2016-12-26 DIAGNOSIS — F79 Unspecified intellectual disabilities: Secondary | ICD-10-CM | POA: Diagnosis not present

## 2016-12-26 DIAGNOSIS — R569 Unspecified convulsions: Secondary | ICD-10-CM

## 2016-12-26 DIAGNOSIS — I10 Essential (primary) hypertension: Secondary | ICD-10-CM | POA: Diagnosis not present

## 2016-12-26 DIAGNOSIS — Z0000 Encounter for general adult medical examination without abnormal findings: Secondary | ICD-10-CM | POA: Insufficient documentation

## 2016-12-26 DIAGNOSIS — Z79899 Other long term (current) drug therapy: Secondary | ICD-10-CM | POA: Insufficient documentation

## 2016-12-26 DIAGNOSIS — Z139 Encounter for screening, unspecified: Secondary | ICD-10-CM

## 2016-12-26 DIAGNOSIS — R451 Restlessness and agitation: Secondary | ICD-10-CM | POA: Insufficient documentation

## 2016-12-26 LAB — URINE DRUG SCREEN, QUALITATIVE (ARMC ONLY)
Amphetamines, Ur Screen: NOT DETECTED
BARBITURATES, UR SCREEN: NOT DETECTED
BENZODIAZEPINE, UR SCRN: NOT DETECTED
CANNABINOID 50 NG, UR ~~LOC~~: NOT DETECTED
COCAINE METABOLITE, UR ~~LOC~~: NOT DETECTED
MDMA (Ecstasy)Ur Screen: NOT DETECTED
Methadone Scn, Ur: NOT DETECTED
Opiate, Ur Screen: NOT DETECTED
Phencyclidine (PCP) Ur S: NOT DETECTED
TRICYCLIC, UR SCREEN: NOT DETECTED

## 2016-12-26 LAB — COMPREHENSIVE METABOLIC PANEL
ALBUMIN: 4.4 g/dL (ref 3.5–5.0)
ALT: 14 U/L — ABNORMAL LOW (ref 17–63)
ANION GAP: 10 (ref 5–15)
AST: 23 U/L (ref 15–41)
Alkaline Phosphatase: 73 U/L (ref 38–126)
BILIRUBIN TOTAL: 0.6 mg/dL (ref 0.3–1.2)
BUN: 11 mg/dL (ref 6–20)
CO2: 23 mmol/L (ref 22–32)
Calcium: 8.8 mg/dL — ABNORMAL LOW (ref 8.9–10.3)
Chloride: 104 mmol/L (ref 101–111)
Creatinine, Ser: 0.71 mg/dL (ref 0.61–1.24)
GFR calc Af Amer: >60 mL/min (ref >60)
GLUCOSE: 130 mg/dL — AB (ref 65–99)
POTASSIUM: 3.3 mmol/L — AB (ref 3.5–5.1)
Sodium: 137 mmol/L (ref 135–145)
TOTAL PROTEIN: 7.7 g/dL (ref 6.5–8.1)

## 2016-12-26 LAB — CBC
HCT: 49.6 % (ref 40.0–52.0)
HEMOGLOBIN: 17.3 g/dL (ref 13.0–18.0)
MCH: 31.7 pg (ref 26.0–34.0)
MCHC: 34.8 g/dL (ref 32.0–36.0)
MCV: 91 fL (ref 80.0–100.0)
Platelets: 280 10*3/uL (ref 150–440)
RBC: 5.45 MIL/uL (ref 4.40–5.90)
RDW: 13.4 % (ref 11.5–14.5)
WBC: 5.2 10*3/uL (ref 3.8–10.6)

## 2016-12-26 LAB — ETHANOL: Alcohol, Ethyl (B): <10 mg/dL (ref <10)

## 2016-12-26 LAB — ACETAMINOPHEN LEVEL

## 2016-12-26 LAB — SALICYLATE LEVEL

## 2016-12-26 MED ORDER — LORAZEPAM 0.5 MG PO TABS
0.5000 mg | ORAL_TABLET | Freq: Four times a day (QID) | ORAL | Status: DC | PRN
  Administered 2016-12-28 – 2017-01-08 (×9): 0.5 mg via ORAL
  Filled 2016-12-26 (×9): qty 1

## 2016-12-26 MED ORDER — SERTRALINE HCL 50 MG PO TABS
ORAL_TABLET | ORAL | Status: AC
Start: 2016-12-26 — End: 2016-12-27
  Filled 2016-12-26: qty 2

## 2016-12-26 MED ORDER — METOPROLOL TARTRATE 25 MG PO TABS
25.0000 mg | ORAL_TABLET | Freq: Two times a day (BID) | ORAL | Status: DC
  Administered 2016-12-26 – 2017-01-09 (×25): 25 mg via ORAL
  Filled 2016-12-26 (×26): qty 1

## 2016-12-26 MED ORDER — SERTRALINE HCL 100 MG PO TABS
100.0000 mg | ORAL_TABLET | Freq: Every day | ORAL | Status: DC
  Administered 2016-12-26 – 2017-01-09 (×15): 100 mg via ORAL
  Filled 2016-12-26 (×2): qty 2
  Filled 2016-12-26: qty 1
  Filled 2016-12-26: qty 2
  Filled 2016-12-26: qty 1
  Filled 2016-12-26: qty 2
  Filled 2016-12-26: qty 1
  Filled 2016-12-26 (×2): qty 2
  Filled 2016-12-26: qty 1
  Filled 2016-12-26 (×4): qty 2

## 2016-12-26 MED ORDER — RISPERIDONE 1 MG PO TABS
ORAL_TABLET | ORAL | Status: AC
  Filled 2016-12-26: qty 1

## 2016-12-26 MED ORDER — RISPERIDONE 0.5 MG PO TBDP
ORAL_TABLET | ORAL | Status: AC
  Filled 2016-12-26: qty 1

## 2016-12-26 MED ORDER — LEVETIRACETAM 500 MG PO TABS
ORAL_TABLET | ORAL | Status: AC
Start: 2016-12-26 — End: 2016-12-26
  Administered 2016-12-26: 750 mg via ORAL
  Filled 2016-12-26: qty 2

## 2016-12-26 MED ORDER — RISPERIDONE 1 MG PO TABS
0.5000 mg | ORAL_TABLET | Freq: Every day | ORAL | Status: DC
  Administered 2016-12-26 – 2017-01-08 (×14): 0.5 mg via ORAL
  Filled 2016-12-26 (×13): qty 1

## 2016-12-26 MED ORDER — CARBAMAZEPINE 200 MG PO TABS
800.0000 mg | ORAL_TABLET | Freq: Two times a day (BID) | ORAL | Status: DC
Start: 2016-12-27 — End: 2017-01-08
  Administered 2016-12-27 – 2017-01-08 (×25): 800 mg via ORAL
  Filled 2016-12-26 (×27): qty 4

## 2016-12-26 MED ORDER — METOPROLOL TARTRATE 25 MG PO TABS
ORAL_TABLET | ORAL | Status: AC
  Administered 2016-12-26: 25 mg via ORAL
  Filled 2016-12-26: qty 1

## 2016-12-26 MED ORDER — LEVETIRACETAM 750 MG PO TABS
750.0000 mg | ORAL_TABLET | Freq: Two times a day (BID) | ORAL | Status: DC
  Administered 2016-12-26 – 2017-01-09 (×28): 750 mg via ORAL
  Filled 2016-12-26 (×36): qty 1

## 2016-12-26 NOTE — ED Notes (Signed)

## 2016-12-26 NOTE — ED Notes (Signed)
Pt uprite on stretcher in darkened room watching TV; calm & cooperative with no c/o voiced; resp even/unlab, lungs clear, apical audible & regular, +BS, abd soft/nondist/nontender, +periph pulses, -edema  ENVIRONMENTAL ASSESSMENT Potentially harmful objects out of patient reach: Yes.   Personal belongings secured: Yes.   Patient dressed in hospital provided attire only: Yes.   Plastic bags out of patient reach: Yes.   Patient care equipment (cords, cables, call bells, lines, and drains) shortened, removed, or accounted for: Yes.   Equipment and supplies removed from bottom of stretcher: Yes.   Potentially toxic materials out of patient reach: Yes.   Sharps container removed or out of patient reach: Yes.

## 2016-12-26 NOTE — ED Notes (Signed)
Redraw light green Misty Stanley RN aware

## 2016-12-26 NOTE — ED Notes (Signed)
Report given to [place] Bing, agreed pt is BHU appropriate. Pt moved to ED BHU per MD order.

## 2016-12-26 NOTE — ED Provider Notes (Signed)
[facility] Emergency Department Provider Note ____________________________________________   First MD Initiated Contact with Patient 12/26/16 1900     (approximate)  I have reviewed the triage vital signs and the nursing notes.   HISTORY  Chief Complaint Behavior Problem  HPI limited due to mental impairment.   HPI Brendan Gamble is a 26 y.o. male who presents with no acute complaint. Apparently patient was being changed to a new group home but there was some type of mixup and he was brought to the emergency department.  Patient is without complaint.    Past Medical History:  Diagnosis Date  . Autism   . Hypertension   . Seizures [facility])     Patient Active Problem List   Diagnosis Date Noted  . Aggressive behavior 09/10/2015  . Tobacco use disorder 09/10/2015  . Seizures (HCC) 06/02/2015  . Mental retardation 06/02/2015  . Elevated blood pressure 06/02/2015  . Involuntary commitment   . Autistic disorder, active     No past surgical history on file.  Prior to Admission medications   Medication Sig Start Date End Date Taking? Authorizing Provider  carbamazepine (TEGRETOL XR) 400 MG 12 hr tablet Take 2 tablets (800 mg total) by mouth 2 (two) times daily. 12/02/15   Jaclyn Shaggy, MD  levETIRAcetam (KEPPRA) 750 MG tablet Take 1 tablet (750 mg total) by mouth 2 (two) times daily. 10/29/15   Jaclyn Shaggy, MD  LORazepam (ATIVAN) 0.5 MG tablet Take 1 tablet (0.5 mg total) by mouth at bedtime. As needed for agitation 09/10/15   Jaclyn Shaggy, MD  metoprolol tartrate (LOPRESSOR) 25 MG tablet Take 1 tablet (25 mg total) by mouth 2 (two) times daily. 10/29/15   Jaclyn Shaggy, MD  risperiDONE (RISPERDAL) 0.5 MG tablet Take 0.5 mg by mouth daily.    [provider]  sertraline (ZOLOFT) 100 MG tablet Take 200 mg by mouth daily.    [provider]    Allergies Patient has no known allergies.  No family history on file.  Social History Social  History  Substance Use Topics  . Smoking status: Current Some Day Smoker    Packs/day: 0.25  . Smokeless tobacco: Never Used  . Alcohol use No    Review of Systems Level V caveat: unable to obtain ROS due to MR and inability to give history    ____________________________________________   PHYSICAL EXAM:  VITAL SIGNS: ED Triage Vitals [12/26/16 1829]  Enc Vitals Group     BP 140/79     Pulse Rate 83     Resp 18     Temp 99 F (37.2 C)     Temp Source Oral     SpO2 99 %     Weight      Height      Head Circumference      Peak Flow      Pain Score      Pain Loc      Pain Edu?      Excl. in GC?     Constitutional: Alert. Well appearing and in no acute distress. Eyes: Conjunctivae are normal.  Head: Atraumatic. Nose: No congestion/rhinnorhea. Mouth/Throat: Mucous membranes are moist.   Neck: Normal range of motion.  Cardiovascular:   Good peripheral circulation. Respiratory: Normal respiratory effort.   Gastrointestinal:  No distention.  Musculoskeletal:  Extremities warm and well perfused.  Neurologic:  Normal speech and language. No gross focal neurologic deficits are appreciated.  Skin:  Skin is warm  and dry. No rash noted. Psychiatric: Speech and behavior are normal.  ____________________________________________   LABS (all labs ordered are listed, but only abnormal results are displayed)  Labs Reviewed  ACETAMINOPHEN LEVEL - Abnormal; Notable for the following:       Result Value   Acetaminophen (Tylenol), Serum <10 (*)    All other components within normal limits  COMPREHENSIVE METABOLIC PANEL - Abnormal; Notable for the following:    Potassium 3.3 (*)    Glucose, Bld 130 (*)    Calcium 8.8 (*)    ALT 14 (*)    All other components within normal limits  ETHANOL  SALICYLATE LEVEL  CBC  URINE DRUG SCREEN, QUALITATIVE ([facility] ONLY)    ____________________________________________  EKG   ____________________________________________  RADIOLOGY    ____________________________________________   PROCEDURES  Procedure(s) performed: No    Critical Care performed: No ____________________________________________   INITIAL IMPRESSION / ASSESSMENT AND PLAN / ED COURSE  Pertinent labs & imaging results that were available during my care of the patient were reviewed by me and considered in my medical decision making (see chart for details).  26 year old male with history of MR presents to the ED after a problem with transfer to a new group home. Patient is currently without complaint. Vital signs are normal, and exam is unremarkable. Patient was evaluated by Dr. Toni Amend, who determined that there is no acute psychiatric issue, and he does not recommend any acute intervention. Patient will have to await social work to help arrange for his appropriate placement.      ____________________________________________   FINAL CLINICAL IMPRESSION(S) / ED DIAGNOSES  Final diagnoses:  Encounter for medical screening examination      NEW MEDICATIONS STARTED DURING THIS VISIT:  New Prescriptions   No medications on file     Note:  This document was prepared using Dragon voice recognition software and may include unintentional dictation errors.    Dionne Bucy, MD 12/26/16 [phone]

## 2016-12-26 NOTE — ED Notes (Signed)
Pt given saltine crackers and a cup of shasta lime. No other needs voiced at this time.

## 2016-12-26 NOTE — ED Notes (Signed)
Pt. To BHU from ED ambulatory without difficulty, to room  BHU2. Report from Raquel RN. Pt. Is alert and oriented to self, warm and dry in no distress. Pt. Denies SI, HI, and AVH. Pt. Calm and cooperative. Pt asking multiple time for hospital to help mom get his belongings from group home in [place] that has apparently closed down. Patient's behaviors are very child like in presentation.  Pt. Made aware of security cameras and Q15 minute rounds. Pt. Encouraged to let Nursing staff know of any concerns or needs.

## 2016-12-26 NOTE — ED Notes (Signed)
Redraw light green Lisa RN aware 

## 2016-12-26 NOTE — ED Triage Notes (Signed)
Pt brought in by bpd and mother.  Pt is at group home in [place] and is not taking meds.  Mother requesting a local group home.  Pt alert, calm cooperative.

## 2016-12-26 NOTE — Consult Note (Signed)
[facility] Face-to-Face Psychiatry Consult   Reason for Consult:  Consult for 26 year old man with a history of intellectual disability and autistic spectrum disorder brought to the emergency room today Referring Physician:  Siadecki Patient Identification: Brendan Gamble MRN:  [id number] Principal Diagnosis: Mental retardation Diagnosis:   Patient Active Problem List   Diagnosis Date Noted  . Aggressive behavior [R46.89] 09/10/2015  . Tobacco use disorder [F17.200] 09/10/2015  . Seizures (HCC) [R56.9] 06/02/2015  . Mental retardation [F79] 06/02/2015  . Elevated blood pressure [IMO0001] 06/02/2015  . Involuntary commitment [Z04.6]   . Autistic disorder, active [F84.0]     Total Time spent with patient: 1 hour  Subjective:   THOMSON HERBERS is a 26 y.o. male patient admitted with "it was because I don't have my medicines".  HPI:  Patient interviewed chart reviewed. Patient is well known from prior encounters although it has been 2 years since we last saw him. He was brought to the emergency room this evening voluntarily evidently by his mother. Mother is no longer available not answering her phone and not in the waiting room so I just got history from the patient. Patient has moderate cognitive disability making his history suspect. He tells me that today he was being transported from his old group home, in [place], to a new group home in [place]. All of this had been arranged in advance and his mother drove him to [facility]. If I understand correctly when they got there the new group home refused to admit him because of something involving the finances and because they did not have any medications on hand. Somehow or another this led them to bring him back to our emergency room. Patient is currently asymptomatic. Denies being depressed. Denies suicidal or homicidal thoughts. Denies any hallucinations. Denies that he's been having any conflicts or run-ins and there is no evidence of any recent  fighting. He cannot recall his last seizure and says he has been medicine compliant.  Social history: Apparently he was being transferred to a new group home in [place] by his mother. Exactly why this fell through and what is going on here is unclear.  Medical history: Patient has a seizure disorder and is on 2 different anticonvulsants chronically. History of high blood pressure.  Substance abuse history: No history of substance abuse problems  Past Psychiatric History: Patient has a diagnosis of autistic spectrum disorder and moderate chronic intellectual disability. He has a long history of some behavior problems typical of these conditions. He often gets oppositional especially with his family and will either lash out or banging his head or do some other behavior when he is frustrated. When he has been in our care here in the emergency room his behavior is usually perfectly fine for the most part. He does not have a history of schizophrenia or real suicide attempts although again he has done some head banging and dangerous behaviors in the past.  Risk to Self: Is patient at risk for suicide?: No Risk to Others:   Prior Inpatient Therapy:   Prior Outpatient Therapy:    Past Medical History:  Past Medical History:  Diagnosis Date  . Autism   . Hypertension   . Seizures (HCC)    No past surgical history on file. Family History: No family history on file. Family Psychiatric  History: None is identified Social History:  History  Alcohol Use No     History  Drug Use No    Social History  Social History  . Marital status: Single    Spouse name: N/A  . Number of children: N/A  . Years of education: N/A   Occupational History  . Disabled    Social History Main Topics  . Smoking status: Current Some Day Smoker    Packs/day: 0.25  . Smokeless tobacco: Never Used  . Alcohol use No  . Drug use: No  . Sexual activity: Not Asked   Other Topics Concern  . None   Social  History Narrative   Lives at [facility].   Right-handed.   Additional Social History:    Allergies:  No Known Allergies  Labs:  Results for orders placed or performed during the hospital encounter of 12/26/16 (from the past 48 hour(s))  Ethanol     Status: None   Collection Time: 12/26/16  6:25 PM  Result Value Ref Range   Alcohol, Ethyl (B) <10 <10 mg/dL    Comment:        LOWEST DETECTABLE LIMIT FOR SERUM ALCOHOL IS 10 mg/dL FOR MEDICAL PURPOSES ONLY Please note change in reference range.   Salicylate level     Status: None   Collection Time: 12/26/16  6:25 PM  Result Value Ref Range   Salicylate Lvl <7.0 2.8 - 30.0 mg/dL  Acetaminophen level     Status: Abnormal   Collection Time: 12/26/16  6:25 PM  Result Value Ref Range   Acetaminophen (Tylenol), Serum <10 (L) 10 - 30 ug/mL    Comment:        THERAPEUTIC CONCENTRATIONS VARY SIGNIFICANTLY. A RANGE OF 10-30 ug/mL MAY BE AN EFFECTIVE CONCENTRATION FOR MANY PATIENTS. HOWEVER, SOME ARE BEST TREATED AT CONCENTRATIONS OUTSIDE THIS RANGE. ACETAMINOPHEN CONCENTRATIONS >150 ug/mL AT 4 HOURS AFTER INGESTION AND >50 ug/mL AT 12 HOURS AFTER INGESTION ARE OFTEN ASSOCIATED WITH TOXIC REACTIONS.   cbc     Status: None   Collection Time: 12/26/16  6:25 PM  Result Value Ref Range   WBC 5.2 3.8 - 10.6 K/uL   RBC 5.45 4.40 - 5.90 MIL/uL   Hemoglobin 17.3 13.0 - 18.0 g/dL   HCT 40.9 81.1 - 91.4 %   MCV 91.0 80.0 - 100.0 fL   MCH 31.7 26.0 - 34.0 pg   MCHC 34.8 32.0 - 36.0 g/dL   RDW 78.2 95.6 - 21.3 %   Platelets 280 150 - 440 K/uL    Current Facility-Administered Medications  Medication Dose Route Frequency Provider Last Rate Last Dose  . [START ON 12/27/2016] carbamazepine (TEGRETOL) tablet 800 mg  800 mg Oral BID PC Eliasar Hlavaty T, MD      . levETIRAcetam (KEPPRA) tablet 750 mg  750 mg Oral BID Jerlene Rockers T, MD      . LORazepam (ATIVAN) tablet 0.5 mg  0.5 mg Oral Q6H PRN Mamie Diiorio T, MD      . metoprolol  tartrate (LOPRESSOR) tablet 25 mg  25 mg Oral BID Shameer Molstad T, MD      . risperiDONE (RISPERDAL) tablet 0.5 mg  0.5 mg Oral QHS Amahia Madonia T, MD      . sertraline (ZOLOFT) tablet 100 mg  100 mg Oral Daily Gwendola Hornaday, Jackquline Denmark, MD       Current Outpatient Prescriptions  Medication Sig Dispense Refill  . carbamazepine (TEGRETOL XR) 400 MG 12 hr tablet Take 2 tablets (800 mg total) by mouth 2 (two) times daily. 120 tablet 3  . levETIRAcetam (KEPPRA) 750 MG tablet Take 1 tablet (750 mg  total) by mouth 2 (two) times daily. 60 tablet 2  . LORazepam (ATIVAN) 0.5 MG tablet Take 1 tablet (0.5 mg total) by mouth at bedtime. As needed for agitation 30 tablet 1  . metoprolol tartrate (LOPRESSOR) 25 MG tablet Take 1 tablet (25 mg total) by mouth 2 (two) times daily. 60 tablet 2  . risperiDONE (RISPERDAL) 0.5 MG tablet Take 0.5 mg by mouth daily.    . sertraline (ZOLOFT) 100 MG tablet Take 200 mg by mouth daily.      Musculoskeletal: Strength & Muscle Tone: within normal limits Gait & Station: normal Patient leans: N/A  Psychiatric Specialty Exam: Physical Exam  Nursing note and vitals reviewed. Constitutional: He appears well-developed and well-nourished.  HENT:  Head: Normocephalic and atraumatic.  Eyes: Pupils are equal, round, and reactive to light. Conjunctivae are normal.  Neck: Normal range of motion.  Cardiovascular: Regular rhythm and normal heart sounds.   Respiratory: Effort normal. No respiratory distress.  GI: Soft.  Musculoskeletal: Normal range of motion.  Neurological: He is alert.  Skin: Skin is warm and dry.  Psychiatric: He has a normal mood and affect. Judgment normal. His speech is delayed. He is slowed. Thought content is not paranoid. Cognition and memory are impaired. He expresses no homicidal and no suicidal ideation.    Review of Systems  Constitutional: Negative.   HENT: Negative.   Eyes: Negative.   Respiratory: Negative.   Cardiovascular: Negative.    Gastrointestinal: Negative.   Musculoskeletal: Negative.   Skin: Negative.   Neurological: Negative.   Psychiatric/Behavioral: Negative.     Blood pressure 140/79, pulse 83, temperature 99 F (37.2 C), temperature source Oral, resp. rate 18, SpO2 99 %.There is no height or weight on file to calculate BMI.  General Appearance: Fairly Groomed  Eye Contact:  Fair  Speech:  Slow  Volume:  Decreased  Mood:  Euthymic  Affect:  Constricted  Thought Process:  Goal Directed  Orientation:  Full (Time, Place, and Person)  Thought Content:  Logical, Rumination and Tangential  Suicidal Thoughts:  No  Homicidal Thoughts:  No  Memory:  Immediate;   Fair Recent;   Fair Remote;   Fair  Judgement:  Fair  Insight:  Fair  Psychomotor Activity:  Normal  Concentration:  Concentration: Fair  Recall:  [employer] of Knowledge:  Fair  Language:  Fair  Akathisia:  No  Handed:  Right  AIMS (if indicated):     Assets:  Manufacturing systems engineer Physical Health Resilience Social Support  ADL's:  Intact  Cognition:  Impaired,  Mild and Moderate  Sleep:        Treatment Plan Summary: Daily contact with patient to assess and evaluate symptoms and progress in treatment, Medication management and Plan 26 year old man with a history of intellectual disability and autistic spectrum disorder who is currently without any symptoms or any clear indication for hospital level treatment. I have tried to reach his mother by telephone without any success. We have searched the emergency room and so far haven't found no paperwork or anything else to explain exactly why he is here. I have gone on the basis of his old notes to restart his medicines most importantly his antiseizure medicines. I will put in a social work consult as mainly what we need to do his figure out how to get him to a new group home. Does not require any other specific treatment. Does not require inpatient psychiatric hospitalization. Because of his  diagnoses of intellectual impairment  and autistic spectrum disorder and the plan for discharge as soon as possible he is not appropriate for treatment in the locked BHU.  Disposition: Patient does not meet criteria for psychiatric inpatient admission. Supportive therapy provided about ongoing stressors.  Mordecai Rasmussen, MD 12/26/2016 7:30 PM

## 2016-12-26 NOTE — ED Notes (Signed)
Pt [facility] on stretcher in darkened room watching TV with no c/o voiced

## 2016-12-27 DIAGNOSIS — F79 Unspecified intellectual disabilities: Secondary | ICD-10-CM | POA: Diagnosis not present

## 2016-12-27 NOTE — ED Notes (Signed)
Patient is alert and oriented. Patient denies SI/HI and A/V/H. Patient states he came here because his group home in [place] closed and he was going to stay at another group home in [place]; however when he went there he did not have his medication and was not accepted because of that reason. Patient mom then brought him to hospital here at [facility]. Patient is calm and cooperative. Support and encouragement provided to patient. Q 15 minute checks in progress and patient remains safe on unit. Monitoring continues.

## 2016-12-27 NOTE — NC FL2 (Signed)
[place] MEDICAID FL2 LEVEL OF CARE SCREENING TOOL     IDENTIFICATION  Patient Name: Brendan Gamble Birthdate: 1990-09-05 Sex: male Admission Date (Current Location): 12/26/2016  [place] and [place] Number:  [address] [id number] [facility] Facility and Address:  [facility], [address] [place], [place] [address]      Provider Number: [id number]  Attending Physician Name and Address:  No att. providers found  Relative Name and Phone Number:  Mother/Legal Brendan Gamble [phone]    Current Level of Care: Hospital Recommended Level of Care: Family Care Home Prior Approval Number:    Date Approved/Denied:   PASRR Number:    Discharge Plan: Domiciliary (Rest home)    Current Diagnoses: Patient Active Problem List   Diagnosis Date Noted  . Aggressive behavior 09/10/2015  . Tobacco use disorder 09/10/2015  . Seizures (HCC) 06/02/2015  . Mental retardation 06/02/2015  . Elevated blood pressure 06/02/2015  . Involuntary commitment   . Autistic disorder, active     Orientation RESPIRATION BLADDER Height & Weight     Self, Time, Situation, Place  Normal Continent Weight:   Height:     BEHAVIORAL SYMPTOMS/MOOD NEUROLOGICAL BOWEL NUTRITION STATUS   (Autistic spectrum disorder; MR) Convulsions/Seizures Continent Diet (Regular diet)  AMBULATORY STATUS COMMUNICATION OF NEEDS Skin   Independent Verbally Normal                       Personal Care Assistance Level of Assistance  Bathing, Feeding, Dressing Bathing Assistance: Independent Feeding assistance: Independent Dressing Assistance: Independent     Functional Limitations Info  Sight, Hearing, Speech Sight Info: Adequate Hearing Info: Adequate Speech Info: Adequate    SPECIAL CARE FACTORS FREQUENCY                       Contractures Contractures Info: Not present    Additional Factors Info  Code Status, Allergies, Psychotropic Code Status Info: Full code Allergies  Info: No known allergies Psychotropic Info: See med list         Current Medications (12/27/2016):  This is the current hospital active medication list Current Facility-Administered Medications  Medication Dose Route Frequency Provider Last Rate Last Dose  . carbamazepine (TEGRETOL) tablet 800 mg  800 mg Oral BID PC Clapacs, John T, MD   800 mg at 12/27/16 1000  . levETIRAcetam (KEPPRA) tablet 750 mg  750 mg Oral BID Clapacs, John T, MD   750 mg at 12/27/16 1000  . LORazepam (ATIVAN) tablet 0.5 mg  0.5 mg Oral Q6H PRN Clapacs, John T, MD      . metoprolol tartrate (LOPRESSOR) tablet 25 mg  25 mg Oral BID Clapacs, Jackquline Denmark, MD   25 mg at 12/27/16 1001  . risperiDONE (RISPERDAL) tablet 0.5 mg  0.5 mg Oral QHS Clapacs, John T, MD   0.5 mg at 12/26/16 2156  . sertraline (ZOLOFT) tablet 100 mg  100 mg Oral Daily Clapacs, Jackquline Denmark, MD   100 mg at 12/27/16 1001   Current Outpatient Prescriptions  Medication Sig Dispense Refill  . carbamazepine (TEGRETOL XR) 400 MG 12 hr tablet Take 2 tablets (800 mg total) by mouth 2 (two) times daily. 120 tablet 3  . levETIRAcetam (KEPPRA) 750 MG tablet Take 1 tablet (750 mg total) by mouth 2 (two) times daily. 60 tablet 2  . LORazepam (ATIVAN) 0.5 MG tablet Take 1 tablet (0.5 mg total) by mouth at bedtime. As needed for agitation 30 tablet  1  . metoprolol tartrate (LOPRESSOR) 25 MG tablet Take 1 tablet (25 mg total) by mouth 2 (two) times daily. 60 tablet 2  . risperiDONE (RISPERDAL) 0.5 MG tablet Take 0.5 mg by mouth daily.    . sertraline (ZOLOFT) 100 MG tablet Take 200 mg by mouth daily.       Discharge Medications: Please see discharge summary for a list of discharge medications.  Relevant Imaging Results:  Relevant Lab Results:   Additional Information SSN: [id number]  Dominic Pea, LCSW

## 2016-12-27 NOTE — ED Provider Notes (Signed)
-----------------------------------------   6:16 AM on 12/27/2016 -----------------------------------------   Blood pressure 110/67, pulse 61, temperature 98.6 F (37 C), temperature source Oral, resp. rate 18, SpO2 97 %.  The patient had no acute events since last update.  Sleeping at this time.  Disposition is pending Psychiatry/Behavioral Medicine team recommendations.     Irean Hong, MD 12/27/16 [phone]

## 2016-12-27 NOTE — ED Notes (Signed)
Patient taking a shower.

## 2016-12-27 NOTE — Clinical Social Work Note (Addendum)
CSW received consult for "Patient appears to of been brought to the emergency room for reasons entirely related to the specifics of his recent placement at a new group home. Details unclear. Please assist with getting him to the correct placement. Thank you."   CSW met with pt in common area of BHU and moved to bedside. CSW introduced self. Pt's group home "[facility]" where he has resided for about a year has closed down. Pt agreeabel to placement, but wants to remain at his Day program in [place]. Arrangements were made for pt to admit to [facility] in [place], but was refused because pt's mother-Sandra Santellan did not have a full check or medication for pt. CSW verified with prior group home administrator-MaKeisha [phone]) that pt had fees that were owed to [facility], which is why pt's mom was only given a $200 check. CSW spoke with pt's mother-Sandra Wigington [phone]) who states she is also the legal guardian. Ms. Lyssy states she cannot take care of pt and he is not able to come to her home because of his behavior and she has young children. CSW explained that as pt's legal guardian it is her responsibility to secure placement for the pt. CSW concerned that as pt's guardian, Ms. Runion did not know the name of his previous [place], the name of the [facility] pt was to admit to, the name of pt's day program, nor the amount of money pt gets monthly.   CSW sent out referrals to all [facility] and [facility] group homes in [facility] and several in [place], [place], and [employer]. CSW continuing to pursue placement and follow for discharge needs.   Oretha Ellis, Latanya Presser, [place] Social Worker-ED [phone]

## 2016-12-27 NOTE — Consult Note (Signed)
[place] Consult   Reason for Consult:  Consult for 26 year old man with a history of intellectual disability and autistic spectrum disorder brought to the emergency room today Referring Physician:  Siadecki Patient Identification: Brendan Gamble MRN:  [id number] Principal Diagnosis: Mental retardation Diagnosis:   Patient Active Problem List   Diagnosis Date Noted  . Aggressive behavior [R46.89] 09/10/2015  . Tobacco use disorder [F17.200] 09/10/2015  . Seizures ([place]) [R56.9] 06/02/2015  . Mental retardation [F79] 06/02/2015  . Elevated blood pressure [IMO0001] 06/02/2015  . Involuntary commitment [Z04.6]   . Autistic disorder, active [F84.0]     Total Time spent with patient: 20 minutes  Subjective:   Brendan Gamble is a 26 y.o. male patient admitted with "it was because I don't have my medicines".  Follow-up note for Wednesday the third. Patient interviewed. Spoke with treatment team including social work. 26 year old man with intellectual developmental disorder and autistic spectrum disorder who was essentially dropped here by his mother when the group home he was going to declined to take him. Spoke to work contacted him and it appears that it was actually a dispute over funding. Patient today is intermittently agitated because he is confused and does not understand why he is still in the emergency room. Has not been violent or aggressive or threatening. Generally compliant with treatment. No seizures.  HPI:  Patient interviewed chart reviewed. Patient is well known from prior encounters although it has been 2 years since we last saw him. He was brought to the emergency room this evening voluntarily evidently by his mother. Mother is no longer available not answering her phone and not in the waiting room so I just got history from the patient. Patient has moderate cognitive disability making his history suspect. He tells me that today he was being transported from his old  group home, in [place], to a new group home in [place]. All of this had been arranged in advance and his mother drove him to [facility]. If I understand correctly when they got there the new group home refused to admit him because of something involving the finances and because they did not have any medications on hand. Somehow or another this led them to bring him back to our emergency room. Patient is currently asymptomatic. Denies being depressed. Denies suicidal or homicidal thoughts. Denies any hallucinations. Denies that he's been having any conflicts or run-ins and there is no evidence of any recent fighting. He cannot recall his last seizure and says he has been medicine compliant.  Social history: Apparently he was being transferred to a new group home in [place] by his mother. Exactly why this fell through and what is going on here is unclear.  Medical history: Patient has a seizure disorder and is on 2 different anticonvulsants chronically. History of high blood pressure.  Substance abuse history: No history of substance abuse problems  Past Psychiatric History: Patient has a diagnosis of autistic spectrum disorder and moderate chronic intellectual disability. He has a long history of some behavior problems typical of these conditions. He often gets oppositional especially with his family and will either lash out or banging his head or do some other behavior when he is frustrated. When he has been in our care here in the emergency room his behavior is usually perfectly fine for the most part. He does not have a history of schizophrenia or real suicide attempts although again he has done some head banging and dangerous behaviors  in the past.  Risk to Self: Is patient at risk for suicide?: No Risk to Others:   Prior Inpatient Therapy:   Prior Outpatient Therapy:    Past Medical History:  Past Medical History:  Diagnosis Date  . Autism   . Hypertension   . Seizures ([place])     No past surgical history on file. Family History: No family history on file. Family Psychiatric  History: None is identified Social History:  History  Alcohol Use No     History  Drug Use No    Social History   Social History  . Marital status: Single    Spouse name: N/A  . Number of children: N/A  . Years of education: N/A   Occupational History  . Disabled    Social History Main Topics  . Smoking status: Current Some Day Smoker    Packs/day: 0.25  . Smokeless tobacco: Never Used  . Alcohol use No  . Drug use: No  . Sexual activity: Not Asked   Other Topics Concern  . None   Social History Narrative   Lives at [facility].   Right-handed.   Additional Social History:    Allergies:  No Known Allergies  Labs:  Results for orders placed or performed during the hospital encounter of 12/26/16 (from the past 48 hour(s))  Ethanol     Status: None   Collection Time: 12/26/16  6:25 PM  Result Value Ref Range   Alcohol, Ethyl (B) <10 <10 mg/dL    Comment:        LOWEST DETECTABLE LIMIT FOR SERUM ALCOHOL IS 10 mg/dL FOR MEDICAL PURPOSES ONLY Please note change in reference range.   Salicylate level     Status: None   Collection Time: 12/26/16  6:25 PM  Result Value Ref Range   Salicylate Lvl <8.1 2.8 - 30.0 mg/dL  Acetaminophen level     Status: Abnormal   Collection Time: 12/26/16  6:25 PM  Result Value Ref Range   Acetaminophen (Tylenol), Serum <10 (L) 10 - 30 ug/mL    Comment:        THERAPEUTIC CONCENTRATIONS VARY SIGNIFICANTLY. A RANGE OF 10-30 ug/mL MAY BE AN EFFECTIVE CONCENTRATION FOR MANY PATIENTS. HOWEVER, SOME ARE BEST TREATED AT CONCENTRATIONS OUTSIDE THIS RANGE. ACETAMINOPHEN CONCENTRATIONS >150 ug/mL AT 4 HOURS AFTER INGESTION AND >50 ug/mL AT 12 HOURS AFTER INGESTION ARE OFTEN ASSOCIATED WITH TOXIC REACTIONS.   cbc     Status: None   Collection Time: 12/26/16  6:25 PM  Result Value Ref Range   WBC 5.2 3.8 - 10.6 K/uL   RBC  5.45 4.40 - 5.90 MIL/uL   Hemoglobin 17.3 13.0 - 18.0 g/dL   HCT 49.6 40.0 - 52.0 %   MCV 91.0 80.0 - 100.0 fL   MCH 31.7 26.0 - 34.0 pg   MCHC 34.8 32.0 - 36.0 g/dL   RDW 13.4 11.5 - 14.5 %   Platelets 280 150 - 440 K/uL  Urine Drug Screen, Qualitative     Status: None   Collection Time: 12/26/16  6:26 PM  Result Value Ref Range   Tricyclic, Ur Screen NONE DETECTED NONE DETECTED   Amphetamines, Ur Screen NONE DETECTED NONE DETECTED   MDMA (Ecstasy)Ur Screen NONE DETECTED NONE DETECTED   Cocaine Metabolite,Ur [place] NONE DETECTED NONE DETECTED   Opiate, Ur Screen NONE DETECTED NONE DETECTED   Phencyclidine (PCP) Ur S NONE DETECTED NONE DETECTED   Cannabinoid 50 Ng, Ur [place] NONE DETECTED NONE DETECTED  Barbiturates, Ur Screen NONE DETECTED NONE DETECTED   Benzodiazepine, Ur Scrn NONE DETECTED NONE DETECTED   Methadone Scn, Ur NONE DETECTED NONE DETECTED    Comment: (NOTE) 427  Tricyclics, urine               Cutoff 1000 ng/mL 200  Amphetamines, urine             Cutoff 1000 ng/mL 300  MDMA (Ecstasy), urine           Cutoff 500 ng/mL 400  Cocaine Metabolite, urine       Cutoff 300 ng/mL 500  Opiate, urine                   Cutoff 300 ng/mL 600  Phencyclidine (PCP), urine      Cutoff 25 ng/mL 700  Cannabinoid, urine              Cutoff 50 ng/mL 800  Barbiturates, urine             Cutoff 200 ng/mL 900  Benzodiazepine, urine           Cutoff 200 ng/mL 1000 Methadone, urine                Cutoff 300 ng/mL 1100 1200 The urine drug screen provides only a preliminary, unconfirmed 1300 analytical test result and should not be used for non-medical 1400 purposes. Clinical consideration and professional judgment should 1500 be applied to any positive drug screen result due to possible 1600 interfering substances. A more specific alternate chemical method 1700 must be used in order to obtain a confirmed analytical result.  1800 Gas chromato graphy / mass spectrometry (GC/MS) is the  preferred 1900 confirmatory method.   Comprehensive metabolic panel     Status: Abnormal   Collection Time: 12/26/16  9:05 PM  Result Value Ref Range   Sodium 137 135 - 145 mmol/L   Potassium 3.3 (L) 3.5 - 5.1 mmol/L   Chloride 104 101 - 111 mmol/L   CO2 23 22 - 32 mmol/L   Glucose, Bld 130 (H) 65 - 99 mg/dL   BUN 11 6 - 20 mg/dL   Creatinine, Ser 0.71 0.61 - 1.24 mg/dL   Calcium 8.8 (L) 8.9 - 10.3 mg/dL   Total Protein 7.7 6.5 - 8.1 g/dL   Albumin 4.4 3.5 - 5.0 g/dL   AST 23 15 - 41 U/L   ALT 14 (L) 17 - 63 U/L   Alkaline Phosphatase 73 38 - 126 U/L   Total Bilirubin 0.6 0.3 - 1.2 mg/dL   GFR calc non Af Amer >60 >60 mL/min   GFR calc Af Amer >60 >60 mL/min    Comment: (NOTE) The eGFR has been calculated using the CKD EPI equation. This calculation has not been validated in all clinical situations. eGFR's persistently <60 mL/min signify possible Chronic Kidney Disease.    Anion gap 10 5 - 15    Current Facility-Administered Medications  Medication Dose Route Frequency Provider Last Rate Last Dose  . carbamazepine (TEGRETOL) tablet 800 mg  800 mg Oral BID PC Kendyl Bissonnette T, MD   800 mg at 12/27/16 1701  . levETIRAcetam (KEPPRA) tablet 750 mg  750 mg Oral BID Mose Colaizzi T, MD   750 mg at 12/27/16 1000  . LORazepam (ATIVAN) tablet 0.5 mg  0.5 mg Oral Q6H PRN Lailee Hoelzel T, MD      . metoprolol tartrate (LOPRESSOR) tablet 25 mg  25 mg Oral BID  Nataliya Graig, Madie Reno, MD   25 mg at 12/27/16 1001  . risperiDONE (RISPERDAL) tablet 0.5 mg  0.5 mg Oral QHS Jenna Ardoin T, MD   0.5 mg at 12/26/16 2156  . sertraline (ZOLOFT) tablet 100 mg  100 mg Oral Daily Prima Rayner, Madie Reno, MD   100 mg at 12/27/16 1001   Current Outpatient Prescriptions  Medication Sig Dispense Refill  . carbamazepine (TEGRETOL XR) 400 MG 12 hr tablet Take 2 tablets (800 mg total) by mouth 2 (two) times daily. 120 tablet 3  . levETIRAcetam (KEPPRA) 750 MG tablet Take 1 tablet (750 mg total) by mouth 2 (two) times  daily. 60 tablet 2  . LORazepam (ATIVAN) 0.5 MG tablet Take 1 tablet (0.5 mg total) by mouth at bedtime. As needed for agitation 30 tablet 1  . metoprolol tartrate (LOPRESSOR) 25 MG tablet Take 1 tablet (25 mg total) by mouth 2 (two) times daily. 60 tablet 2  . risperiDONE (RISPERDAL) 0.5 MG tablet Take 0.5 mg by mouth daily.    . sertraline (ZOLOFT) 100 MG tablet Take 200 mg by mouth daily.      Musculoskeletal: Strength & Muscle Tone: within normal limits Gait & Station: normal Patient leans: N/A  Psychiatric Specialty Exam: Physical Exam  Nursing note and vitals reviewed. Constitutional: He appears well-developed and well-nourished.  HENT:  Head: Normocephalic and atraumatic.  Eyes: Pupils are equal, round, and reactive to light. Conjunctivae are normal.  Neck: Normal range of motion.  Cardiovascular: Regular rhythm and normal heart sounds.   Respiratory: Effort normal. No respiratory distress.  GI: Soft.  Musculoskeletal: Normal range of motion.  Neurological: He is alert.  Skin: Skin is warm and dry.  Psychiatric: He has a normal mood and affect. Judgment normal. His speech is delayed. He is slowed. Thought content is not paranoid. Cognition and memory are impaired. He expresses no homicidal and no suicidal ideation.    Review of Systems  Constitutional: Negative.   HENT: Negative.   Eyes: Negative.   Respiratory: Negative.   Cardiovascular: Negative.   Gastrointestinal: Negative.   Musculoskeletal: Negative.   Skin: Negative.   Neurological: Negative.   Psychiatric/Behavioral: Negative.     Blood pressure 110/63, pulse 77, temperature 98.6 F (37 C), temperature source Oral, resp. rate 16, SpO2 99 %.There is no height or weight on file to calculate BMI.  General Appearance: Fairly Groomed  Eye Contact:  Fair  Speech:  Slow  Volume:  Decreased  Mood:  Euthymic  Affect:  Constricted  Thought Process:  Goal Directed  Orientation:  Full (Time, Place, and Person)   Thought Content:  Logical, Rumination and Tangential  Suicidal Thoughts:  No  Homicidal Thoughts:  No  Memory:  Immediate;   Fair Recent;   Fair Remote;   Fair  Judgement:  Fair  Insight:  Fair  Psychomotor Activity:  Normal  Concentration:  Concentration: Fair  Recall:  [employer] of Knowledge:  Fair  Language:  Fair  Akathisia:  No  Handed:  Right  AIMS (if indicated):     Assets:  Communication Skills Physical Health Resilience Social Support  ADL's:  Intact  Cognition:  Impaired,  Mild and Moderate  Sleep:        Treatment Plan Summary: Daily contact with patient to assess and evaluate symptoms and progress in treatment, Medication management and Plan Patient is at his baseline but his baseline includes a susceptibility to frustration. Try to keep him soothed and occupied. No indication for  acute change to medicine. Social work is working on trying to find some kind of disposition as soon as possible.  Disposition: Patient does not meet criteria for psychiatric inpatient admission. Supportive therapy provided about ongoing stressors.  Alethia Berthold, MD 12/27/2016 5:24 PM

## 2016-12-28 DIAGNOSIS — F79 Unspecified intellectual disabilities: Secondary | ICD-10-CM | POA: Diagnosis not present

## 2016-12-28 NOTE — ED Notes (Signed)
BEHAVIORAL HEALTH ROUNDING  Patient sleeping: No.  Patient alert and oriented: yes  Behavior appropriate: Yes. ; If no, describe:  Nutrition and fluids offered: Yes  Toileting and hygiene offered: Yes  Sitter present: not applicable, Q 15 min safety rounds and observation via security camera. Law enforcement present: Yes ODS  

## 2016-12-28 NOTE — ED Notes (Addendum)
BEHAVIORAL HEALTH ROUNDING  Patient sleeping: No.  Patient alert and oriented: yes  Behavior appropriate: Yes. ; If no, describe:  Nutrition and fluids offered: Yes  Toileting and hygiene offered: Yes  Sitter present: not applicable, Q 15 min safety rounds and observation via security camera. Law enforcement present: Yes ODS  ED BHU PLACEMENT JUSTIFICATION  Is the patient under IVC or is there intent for IVC: no.  Is the patient medically cleared: Yes.  Is there vacancy in the ED BHU: Yes.  Is the population mix appropriate for patient: Yes.  Is the patient awaiting placement in inpatient or outpatient setting: Yes.  Has the patient had a psychiatric consult: Yes.  Survey of unit performed for contraband, proper placement and condition of furniture, tampering with fixtures in bathroom, shower, and each patient room: Yes. ; Findings: All clear  APPEARANCE/BEHAVIOR  calm, cooperative and adequate rapport can be established  NEURO ASSESSMENT  Orientation: time, place and person  Hallucinations: No.None noted (Hallucinations)  Speech: Normal  Gait: normal  RESPIRATORY ASSESSMENT  WNL  CARDIOVASCULAR ASSESSMENT  WNL  GASTROINTESTINAL ASSESSMENT  WNL  EXTREMITIES  WNL  PLAN OF CARE  Provide calm/safe environment. Vital signs assessed TID. ED BHU Assessment once each 12-hour shift. Collaborate with TTS daily or as condition indicates. Assure the ED provider has rounded once each shift. Provide and encourage hygiene. Provide redirection as needed. Assess for escalating behavior; address immediately and inform ED provider.  Assess family dynamic and appropriateness for visitation as needed: Yes. ; If necessary, describe findings:  Educate the patient/family about BHU procedures/visitation: Yes. ; If necessary, describe findings: Pt is calm and cooperative at this time. Pt understanding and accepting of unit procedures/rules. Will continue to monitor with Q 15 min safety rounds and  observation via security camera.

## 2016-12-28 NOTE — ED Notes (Signed)
Patient alert and verbal. Patient denies SI/HI and A/V/H. Patient states he sad about his group home closing. Patient provided support and encouragement. Q 15 minute checks in progress and maintained for safety. Patient remains safe on unit.

## 2016-12-28 NOTE — ED Provider Notes (Signed)
Vitals:   12/27/16 1951 12/28/16 0636  BP: 105/65 110/62  Pulse: 99 69  Resp:  18  Temp:    SpO2:     No acute events reported to me overnight from nursing staff.  Patient is awaiting social placement back to a group home.  Patient alert and interactive this morning with me, stating he wanted to go back to his group home.   Brendan Rooks, MD 12/28/16 [phone]

## 2016-12-28 NOTE — ED Notes (Signed)
ENVIRONMENTAL ASSESSMENT  Potentially harmful objects out of patient reach: Yes.  Personal belongings secured: Yes.  Patient dressed in hospital provided attire only: Yes.  Plastic bags out of patient reach: Yes.  Patient care equipment (cords, cables, call bells, lines, and drains) shortened, removed, or accounted for: Yes.  Equipment and supplies removed from bottom of stretcher: Yes.  Potentially toxic materials out of patient reach: Yes.  Sharps container removed or out of patient reach: Yes.   BEHAVIORAL HEALTH ROUNDING Patient sleeping: Yes.   Patient alert and oriented: not applicable SLEEPING Behavior appropriate: Yes.  ; If no, describe: SLEEPING Nutrition and fluids offered: No SLEEPING Toileting and hygiene offered: NoSLEEPING Sitter present: not applicable, Q 15 min safety rounds and observation via security camera. Law enforcement present: Yes ODS  

## 2016-12-28 NOTE — Consult Note (Signed)
[facility] Consult   Reason for Consult:  Consult for 26 year old man with a history of intellectual disability and autistic spectrum disorder brought to the emergency room today Referring Physician:  Siadecki Patient Identification: Brendan Gamble MRN:  [id number] Principal Diagnosis: Mental retardation Diagnosis:   Patient Active Problem List   Diagnosis Date Noted  . Aggressive behavior [R46.89] 09/10/2015  . Tobacco use disorder [F17.200] 09/10/2015  . Seizures ([place]) [R56.9] 06/02/2015  . Mental retardation [F79] 06/02/2015  . Elevated blood pressure [IMO0001] 06/02/2015  . Involuntary commitment [Z04.6]   . Autistic disorder, active [F84.0]     Total Time spent with patient: 20 minutes  Subjective:   Brendan Gamble is a 26 y.o. male patient admitted with "it was because I don't have my medicines".  Follow-up note for Thursday the fourth. Patient has no new complaints. He is a little bit restless and frequently wants to use the telephone and has a lot of questions all of which is pretty typical for him. Has not been threatening violence or hurting himself. Not showing signs of psychosis.  HPI:  Patient interviewed chart reviewed. Patient is well known from prior encounters although it has been 2 years since we last saw him. He was brought to the emergency room this evening voluntarily evidently by his mother. Mother is no longer available not answering her phone and not in the waiting room so I just got history from the patient. Patient has moderate cognitive disability making his history suspect. He tells me that today he was being transported from his old group home, in [place], to a new group home in [place]. All of this had been arranged in advance and his mother drove him to [facility]. If I understand correctly when they got there the new group home refused to admit him because of something involving the finances and because they did not have any medications  on hand. Somehow or another this led them to bring him back to our emergency room. Patient is currently asymptomatic. Denies being depressed. Denies suicidal or homicidal thoughts. Denies any hallucinations. Denies that he's been having any conflicts or run-ins and there is no evidence of any recent fighting. He cannot recall his last seizure and says he has been medicine compliant.  Social history: Apparently he was being transferred to a new group home in [place] by his mother. Exactly why this fell through and what is going on here is unclear.  Medical history: Patient has a seizure disorder and is on 2 different anticonvulsants chronically. History of high blood pressure.  Substance abuse history: No history of substance abuse problems  Past Psychiatric History: Patient has a diagnosis of autistic spectrum disorder and moderate chronic intellectual disability. He has a long history of some behavior problems typical of these conditions. He often gets oppositional especially with his family and will either lash out or banging his head or do some other behavior when he is frustrated. When he has been in our care here in the emergency room his behavior is usually perfectly fine for the most part. He does not have a history of schizophrenia or real suicide attempts although again he has done some head banging and dangerous behaviors in the past.  Risk to Self: Is patient at risk for suicide?: No Risk to Others:   Prior Inpatient Therapy:   Prior Outpatient Therapy:    Past Medical History:  Past Medical History:  Diagnosis Date  . Autism   .  Hypertension   . Seizures ([place])    No past surgical history on file. Family History: No family history on file. Family Psychiatric  History: None is identified Social History:  History  Alcohol Use No     History  Drug Use No    Social History   Social History  . Marital status: Single    Spouse name: N/A  . Number of children: N/A  .  Years of education: N/A   Occupational History  . Disabled    Social History Main Topics  . Smoking status: Current Some Day Smoker    Packs/day: 0.25  . Smokeless tobacco: Never Used  . Alcohol use No  . Drug use: No  . Sexual activity: Not Asked   Other Topics Concern  . None   Social History Narrative   Lives at [facility].   Right-handed.   Additional Social History:    Allergies:  No Known Allergies  Labs:  Results for orders placed or performed during the hospital encounter of 12/26/16 (from the past 48 hour(s))  Ethanol     Status: None   Collection Time: 12/26/16  6:25 PM  Result Value Ref Range   Alcohol, Ethyl (B) <10 <10 mg/dL    Comment:        LOWEST DETECTABLE LIMIT FOR SERUM ALCOHOL IS 10 mg/dL FOR MEDICAL PURPOSES ONLY Please note change in reference range.   Salicylate level     Status: None   Collection Time: 12/26/16  6:25 PM  Result Value Ref Range   Salicylate Lvl <7.7 2.8 - 30.0 mg/dL  Acetaminophen level     Status: Abnormal   Collection Time: 12/26/16  6:25 PM  Result Value Ref Range   Acetaminophen (Tylenol), Serum <10 (L) 10 - 30 ug/mL    Comment:        THERAPEUTIC CONCENTRATIONS VARY SIGNIFICANTLY. A RANGE OF 10-30 ug/mL MAY BE AN EFFECTIVE CONCENTRATION FOR MANY PATIENTS. HOWEVER, SOME ARE BEST TREATED AT CONCENTRATIONS OUTSIDE THIS RANGE. ACETAMINOPHEN CONCENTRATIONS >150 ug/mL AT 4 HOURS AFTER INGESTION AND >50 ug/mL AT 12 HOURS AFTER INGESTION ARE OFTEN ASSOCIATED WITH TOXIC REACTIONS.   cbc     Status: None   Collection Time: 12/26/16  6:25 PM  Result Value Ref Range   WBC 5.2 3.8 - 10.6 K/uL   RBC 5.45 4.40 - 5.90 MIL/uL   Hemoglobin 17.3 13.0 - 18.0 g/dL   HCT 49.6 40.0 - 52.0 %   MCV 91.0 80.0 - 100.0 fL   MCH 31.7 26.0 - 34.0 pg   MCHC 34.8 32.0 - 36.0 g/dL   RDW 13.4 11.5 - 14.5 %   Platelets 280 150 - 440 K/uL  Urine Drug Screen, Qualitative     Status: None   Collection Time: 12/26/16  6:26 PM   Result Value Ref Range   Tricyclic, Ur Screen NONE DETECTED NONE DETECTED   Amphetamines, Ur Screen NONE DETECTED NONE DETECTED   MDMA (Ecstasy)Ur Screen NONE DETECTED NONE DETECTED   Cocaine Metabolite,Ur [place] NONE DETECTED NONE DETECTED   Opiate, Ur Screen NONE DETECTED NONE DETECTED   Phencyclidine (PCP) Ur S NONE DETECTED NONE DETECTED   Cannabinoid 50 Ng, Ur [place] NONE DETECTED NONE DETECTED   Barbiturates, Ur Screen NONE DETECTED NONE DETECTED   Benzodiazepine, Ur Scrn NONE DETECTED NONE DETECTED   Methadone Scn, Ur NONE DETECTED NONE DETECTED    Comment: (NOTE) 414  Tricyclics, urine  Cutoff 1000 ng/mL 200  Amphetamines, urine             Cutoff 1000 ng/mL 300  MDMA (Ecstasy), urine           Cutoff 500 ng/mL 400  Cocaine Metabolite, urine       Cutoff 300 ng/mL 500  Opiate, urine                   Cutoff 300 ng/mL 600  Phencyclidine (PCP), urine      Cutoff 25 ng/mL 700  Cannabinoid, urine              Cutoff 50 ng/mL 800  Barbiturates, urine             Cutoff 200 ng/mL 900  Benzodiazepine, urine           Cutoff 200 ng/mL 1000 Methadone, urine                Cutoff 300 ng/mL 1100 1200 The urine drug screen provides only a preliminary, unconfirmed 1300 analytical test result and should not be used for non-medical 1400 purposes. Clinical consideration and professional judgment should 1500 be applied to any positive drug screen result due to possible 1600 interfering substances. A more specific alternate chemical method 1700 must be used in order to obtain a confirmed analytical result.  1800 Gas chromato graphy / mass spectrometry (GC/MS) is the preferred 1900 confirmatory method.   Comprehensive metabolic panel     Status: Abnormal   Collection Time: 12/26/16  9:05 PM  Result Value Ref Range   Sodium 137 135 - 145 mmol/L   Potassium 3.3 (L) 3.5 - 5.1 mmol/L   Chloride 104 101 - 111 mmol/L   CO2 23 22 - 32 mmol/L   Glucose, Bld 130 (H) 65 - 99 mg/dL   BUN 11  6 - 20 mg/dL   Creatinine, Ser 0.71 0.61 - 1.24 mg/dL   Calcium 8.8 (L) 8.9 - 10.3 mg/dL   Total Protein 7.7 6.5 - 8.1 g/dL   Albumin 4.4 3.5 - 5.0 g/dL   AST 23 15 - 41 U/L   ALT 14 (L) 17 - 63 U/L   Alkaline Phosphatase 73 38 - 126 U/L   Total Bilirubin 0.6 0.3 - 1.2 mg/dL   GFR calc non Af Amer >60 >60 mL/min   GFR calc Af Amer >60 >60 mL/min    Comment: (NOTE) The eGFR has been calculated using the CKD EPI equation. This calculation has not been validated in all clinical situations. eGFR's persistently <60 mL/min signify possible Chronic Kidney Disease.    Anion gap 10 5 - 15    Current Facility-Administered Medications  Medication Dose Route Frequency Provider Last Rate Last Dose  . carbamazepine (TEGRETOL) tablet 800 mg  800 mg Oral BID PC Teriana Danker T, MD   800 mg at 12/28/16 1715  . levETIRAcetam (KEPPRA) tablet 750 mg  750 mg Oral BID Suzann Lazaro, Madie Reno, MD   750 mg at 12/28/16 0953  . LORazepam (ATIVAN) tablet 0.5 mg  0.5 mg Oral Q6H PRN Lalonnie Shaffer, Madie Reno, MD   0.5 mg at 12/28/16 0913  . metoprolol tartrate (LOPRESSOR) tablet 25 mg  25 mg Oral BID Meagan Ancona, Madie Reno, MD   25 mg at 12/28/16 0914  . risperiDONE (RISPERDAL) tablet 0.5 mg  0.5 mg Oral QHS Orel Hord T, MD   0.5 mg at 12/27/16 2103  . sertraline (ZOLOFT) tablet 100 mg  100 mg Oral  Daily Alaiah Lundy, Madie Reno, MD   100 mg at 12/28/16 1975   Current Outpatient Prescriptions  Medication Sig Dispense Refill  . carbamazepine (TEGRETOL XR) 400 MG 12 hr tablet Take 2 tablets (800 mg total) by mouth 2 (two) times daily. 120 tablet 3  . levETIRAcetam (KEPPRA) 750 MG tablet Take 1 tablet (750 mg total) by mouth 2 (two) times daily. 60 tablet 2  . LORazepam (ATIVAN) 0.5 MG tablet Take 1 tablet (0.5 mg total) by mouth at bedtime. As needed for agitation 30 tablet 1  . metoprolol tartrate (LOPRESSOR) 25 MG tablet Take 1 tablet (25 mg total) by mouth 2 (two) times daily. 60 tablet 2  . risperiDONE (RISPERDAL) 0.5 MG tablet Take 0.5  mg by mouth daily.    . sertraline (ZOLOFT) 100 MG tablet Take 200 mg by mouth daily.      Musculoskeletal: Strength & Muscle Tone: within normal limits Gait & Station: normal Patient leans: N/A  Psychiatric Specialty Exam: Physical Exam  Nursing note and vitals reviewed. Constitutional: He appears well-developed and well-nourished.  HENT:  Head: Normocephalic and atraumatic.  Eyes: Pupils are equal, round, and reactive to light. Conjunctivae are normal.  Neck: Normal range of motion.  Cardiovascular: Regular rhythm and normal heart sounds.   Respiratory: Effort normal. No respiratory distress.  GI: Soft.  Musculoskeletal: Normal range of motion.  Neurological: He is alert.  Skin: Skin is warm and dry.  Psychiatric: He has a normal mood and affect. Judgment normal. His speech is delayed. He is slowed. Thought content is not paranoid. Cognition and memory are impaired. He expresses no homicidal and no suicidal ideation.    Review of Systems  Constitutional: Negative.   HENT: Negative.   Eyes: Negative.   Respiratory: Negative.   Cardiovascular: Negative.   Gastrointestinal: Negative.   Musculoskeletal: Negative.   Skin: Negative.   Neurological: Negative.   Psychiatric/Behavioral: Negative.     Blood pressure 133/78, pulse 79, temperature 98.3 F (36.8 C), temperature source Oral, resp. rate 20, SpO2 100 %.There is no height or weight on file to calculate BMI.  General Appearance: Fairly Groomed  Eye Contact:  Fair  Speech:  Slow  Volume:  Decreased  Mood:  Euthymic  Affect:  Constricted  Thought Process:  Goal Directed  Orientation:  Full (Time, Place, and Person)  Thought Content:  Logical, Rumination and Tangential  Suicidal Thoughts:  No  Homicidal Thoughts:  No  Memory:  Immediate;   Fair Recent;   Fair Remote;   Fair  Judgement:  Fair  Insight:  Fair  Psychomotor Activity:  Normal  Concentration:  Concentration: Fair  Recall:  [employer] of Knowledge:   Fair  Language:  Fair  Akathisia:  No  Handed:  Right  AIMS (if indicated):     Assets:  Armed forces logistics/support/administrative officer Physical Health Resilience Social Support  ADL's:  Intact  Cognition:  Impaired,  Mild and Moderate  Sleep:        Treatment Plan Summary: Daily contact with patient to assess and evaluate symptoms and progress in treatment, Medication management and Plan 26 year old man with autistic spectrum disorder. He is a placement issue at this point. No indication to change any medicine. Staff continue to try and do supportive counseling to keep him calm.  Disposition: Patient does not meet criteria for psychiatric inpatient admission. Supportive therapy provided about ongoing stressors.  Alethia Berthold, MD 12/28/2016 5:59 PM

## 2016-12-28 NOTE — ED Notes (Signed)
Patient has been intrusive throughout the shift making repeat requests that staff relays information to Dr Toni Amend concerning his discharge to the group home.  Patient has cognitive delay which hinders him from understanding that staff has heard his requests and will forward the information to MD even after multiple attempts of reassurance.

## 2016-12-29 DIAGNOSIS — F79 Unspecified intellectual disabilities: Secondary | ICD-10-CM | POA: Diagnosis not present

## 2016-12-29 NOTE — ED Notes (Signed)
BEHAVIORAL HEALTH ROUNDING Patient sleeping: Yes.   Patient alert and oriented: not applicable SLEEPING Behavior appropriate: Yes.  ; If no, describe: SLEEPING Nutrition and fluids offered: No SLEEPING Toileting and hygiene offered: NoSLEEPING Sitter present: not applicable, Q 15 min safety rounds and observation via security camera. Law enforcement present: Yes ODS 

## 2016-12-29 NOTE — Progress Notes (Signed)
Clinical Social Worker (CSW) contacted Brendan Gamble [phone] from [facility] to follow up on referral. Brendan Gamble did not answer and a voicemail was left.   [employer], LCSW [phone]

## 2016-12-29 NOTE — ED Provider Notes (Signed)
-----------------------------------------   7:29 AM on 12/29/2016 -----------------------------------------   No reports of acute issues overnight.  Disposition is pending Psychiatry/Behavioral Medicine team recommendations.     Jene Every, MD 12/29/16 0730

## 2016-12-29 NOTE — Consult Note (Signed)
[facility] Face-to-Face Psychiatry Consult   Reason for Consult:  Consult for 26 year old man with a history of intellectual disability and autistic spectrum disorder brought to the emergency room today Referring Physician:  Siadecki Patient Identification: Brendan Gamble MRN:  [id number] Principal Diagnosis: Mental retardation Diagnosis:   Patient Active Problem List   Diagnosis Date Noted  . Aggressive behavior [R46.89] 09/10/2015  . Tobacco use disorder [F17.200] 09/10/2015  . Seizures ([facility]) [R56.9] 06/02/2015  . Mental retardation [F79] 06/02/2015  . Elevated blood pressure [IMO0001] 06/02/2015  . Involuntary commitment [Z04.6]   . Autistic disorder, active [F84.0]     Total Time spent with patient: 20 minutes  Subjective:   Brendan Gamble is a 26 y.o. male patient admitted with "it was because I don't have my medicines".  Follow-up Friday the fifth. Patient has no new complaints. No significant change to behavior. Occasionally gets irritated and a little bit edgy but has not been violent or threatening.  HPI:  Patient interviewed chart reviewed. Patient is well known from prior encounters although it has been 2 years since we last saw him. He was brought to the emergency room this evening voluntarily evidently by his mother. Mother is no longer available not answering her phone and not in the waiting room so I just got history from the patient. Patient has moderate cognitive disability making his history suspect. He tells me that today he was being transported from his old group home, in [place], to a new group home in [place]. All of this had been arranged in advance and his mother drove him to [facility]. If I understand correctly when they got there the new group home refused to admit him because of something involving the finances and because they did not have any medications on hand. Somehow or another this led them to bring him back to our emergency room. Patient is currently  asymptomatic. Denies being depressed. Denies suicidal or homicidal thoughts. Denies any hallucinations. Denies that he's been having any conflicts or run-ins and there is no evidence of any recent fighting. He cannot recall his last seizure and says he has been medicine compliant.  Social history: Apparently he was being transferred to a new group home in [place] by his mother. Exactly why this fell through and what is going on here is unclear.  Medical history: Patient has a seizure disorder and is on 2 different anticonvulsants chronically. History of high blood pressure.  Substance abuse history: No history of substance abuse problems  Past Psychiatric History: Patient has a diagnosis of autistic spectrum disorder and moderate chronic intellectual disability. He has a long history of some behavior problems typical of these conditions. He often gets oppositional especially with his family and will either lash out or banging his head or do some other behavior when he is frustrated. When he has been in our care here in the emergency room his behavior is usually perfectly fine for the most part. He does not have a history of schizophrenia or real suicide attempts although again he has done some head banging and dangerous behaviors in the past.  Risk to Self: Is patient at risk for suicide?: No Risk to Others:   Prior Inpatient Therapy:   Prior Outpatient Therapy:    Past Medical History:  Past Medical History:  Diagnosis Date  . Autism   . Hypertension   . Seizures ([facility])    No past surgical history on file. Family History: No family history on  file. Family Psychiatric  History: None is identified Social History:  History  Alcohol Use No     History  Drug Use No    Social History   Social History  . Marital status: Single    Spouse name: N/A  . Number of children: N/A  . Years of education: N/A   Occupational History  . Disabled    Social History Main Topics  . Smoking  status: Current Some Day Smoker    Packs/day: 0.25  . Smokeless tobacco: Never Used  . Alcohol use No  . Drug use: No  . Sexual activity: Not Asked   Other Topics Concern  . None   Social History Narrative   Lives at [facility].   Right-handed.   Additional Social History:    Allergies:  No Known Allergies  Labs:  No results found for this or any previous visit (from the past 48 hour(s)).  Current Facility-Administered Medications  Medication Dose Route Frequency Provider Last Rate Last Dose  . carbamazepine (TEGRETOL) tablet 800 mg  800 mg Oral BID PC Clapacs, John T, MD   800 mg at 12/29/16 1000  . levETIRAcetam (KEPPRA) tablet 750 mg  750 mg Oral BID Clapacs, John T, MD   750 mg at 12/29/16 1002  . LORazepam (ATIVAN) tablet 0.5 mg  0.5 mg Oral Q6H PRN Clapacs, Jackquline Denmark, MD   0.5 mg at 12/28/16 2256  . metoprolol tartrate (LOPRESSOR) tablet 25 mg  25 mg Oral BID Clapacs, Jackquline Denmark, MD   25 mg at 12/29/16 1003  . risperiDONE (RISPERDAL) tablet 0.5 mg  0.5 mg Oral QHS Clapacs, John T, MD   0.5 mg at 12/28/16 2256  . sertraline (ZOLOFT) tablet 100 mg  100 mg Oral Daily Clapacs, Jackquline Denmark, MD   100 mg at 12/29/16 1003   Current Outpatient Prescriptions  Medication Sig Dispense Refill  . carbamazepine (TEGRETOL XR) 400 MG 12 hr tablet Take 2 tablets (800 mg total) by mouth 2 (two) times daily. 120 tablet 3  . levETIRAcetam (KEPPRA) 750 MG tablet Take 1 tablet (750 mg total) by mouth 2 (two) times daily. 60 tablet 2  . LORazepam (ATIVAN) 0.5 MG tablet Take 1 tablet (0.5 mg total) by mouth at bedtime. As needed for agitation 30 tablet 1  . metoprolol tartrate (LOPRESSOR) 25 MG tablet Take 1 tablet (25 mg total) by mouth 2 (two) times daily. 60 tablet 2  . risperiDONE (RISPERDAL) 0.5 MG tablet Take 0.5 mg by mouth daily.    . sertraline (ZOLOFT) 100 MG tablet Take 200 mg by mouth daily.      Musculoskeletal: Strength & Muscle Tone: within normal limits Gait & Station:  normal Patient leans: N/A  Psychiatric Specialty Exam: Physical Exam  Nursing note and vitals reviewed. Constitutional: He appears well-developed and well-nourished.  HENT:  Head: Normocephalic and atraumatic.  Eyes: Pupils are equal, round, and reactive to light. Conjunctivae are normal.  Neck: Normal range of motion.  Cardiovascular: Regular rhythm and normal heart sounds.   Respiratory: Effort normal. No respiratory distress.  GI: Soft.  Musculoskeletal: Normal range of motion.  Neurological: He is alert.  Skin: Skin is warm and dry.  Psychiatric: He has a normal mood and affect. Judgment normal. His speech is delayed. He is slowed. Thought content is not paranoid. Cognition and memory are impaired. He expresses no homicidal and no suicidal ideation.    Review of Systems  Constitutional: Negative.   HENT: Negative.  Eyes: Negative.   Respiratory: Negative.   Cardiovascular: Negative.   Gastrointestinal: Negative.   Musculoskeletal: Negative.   Skin: Negative.   Neurological: Negative.   Psychiatric/Behavioral: Negative.     Blood pressure 125/76, pulse 77, temperature 98.3 F (36.8 C), temperature source Oral, resp. rate 18, SpO2 100 %.There is no height or weight on file to calculate BMI.  General Appearance: Fairly Groomed  Eye Contact:  Fair  Speech:  Slow  Volume:  Decreased  Mood:  Euthymic  Affect:  Constricted  Thought Process:  Goal Directed  Orientation:  Full (Time, Place, and Person)  Thought Content:  Logical, Rumination and Tangential  Suicidal Thoughts:  No  Homicidal Thoughts:  No  Memory:  Immediate;   Fair Recent;   Fair Remote;   Fair  Judgement:  Fair  Insight:  Fair  Psychomotor Activity:  Normal  Concentration:  Concentration: Fair  Recall:  [employer] of Knowledge:  Fair  Language:  Fair  Akathisia:  No  Handed:  Right  AIMS (if indicated):     Assets:  Communication Skills Physical Health Resilience Social Support  ADL's:  Intact   Cognition:  Impaired,  Mild and Moderate  Sleep:        Treatment Plan Summary: Daily contact with patient to assess and evaluate symptoms and progress in treatment, Medication management and Plan Patient still has no need or indication for inpatient hospital treatment.Tacey Ruiz placement issue. Social work is aware and is working on this. No need for any change to treatment  Disposition: Patient does not meet criteria for psychiatric inpatient admission. Supportive therapy provided about ongoing stressors.  Mordecai Rasmussen, MD 12/29/2016 4:33 PM

## 2016-12-29 NOTE — ED Notes (Signed)
Patient alert and oriented. Patient denies pain. Appetite stable. Patient taking medications as ordered. Patient continues to voice concerns about group home and where he will discharge to. Patient denies SI/HI and A/V/H. Support and encouragement provided. Q 15 minute checks in progress and patient remains safe on unit. Monitoring of patient continues.

## 2016-12-29 NOTE — ED Notes (Signed)
PT  VOL/  PENDING  PLACEMENT 

## 2016-12-29 NOTE — ED Notes (Signed)
Patient observed hitting pillow on bed states he is feeling aggressive because day program he was in are not answering his questions and working hard enough to get him back in a group home that uses their program. Patient redirected and talked to by this Clinical research associate and informed that staff in the hospital is working to get him in a group home and that he does not have to feel like he has to find his own place to go. Patient denied thoughts of harming himself or anyone else. Patient allowed to vent his thoughts and feelings and support and encouragement provided by this writer that staff is working hard as they can to get him in a good place when he is discharged. Patient calm and cooperative, patient states he wants to rest. Aunt called per patient request; however no answer. Patient informed of no one answering. Patient is currently resting in bed and remains safe on unit. Patient denies any further feeling of aggression. Monitoring of patient continues and Q 15 minute checks continues. All staff aware of patient feelings and to keep close watch on him.

## 2016-12-30 MED ORDER — LORAZEPAM 2 MG/ML IJ SOLN
INTRAMUSCULAR | Status: AC
  Filled 2016-12-30: qty 1

## 2016-12-30 NOTE — ED Notes (Signed)
Patient mood is depressed.  Patient interaction with staff was minimal during the shift.  Patient cooperative.  No distress noted.

## 2016-12-30 NOTE — ED Notes (Signed)
Voluntary/ pending placement 

## 2016-12-30 NOTE — ED Notes (Signed)
Patient resting quietly in room. No noted distress or abnormal behaviors noted. Will continue 15 minute checks and observation by security camera for safety. 
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
## 2016-12-30 NOTE — ED Provider Notes (Signed)
-----------------------------------------   7:03 AM on 12/30/2016 -----------------------------------------   Blood pressure 99/60, pulse 65, temperature 97.8 F (36.6 C), temperature source Oral, resp. rate 18, SpO2 98 %.  The patient had no acute events since last update.  Calm and cooperative at this time.  Disposition is pending Psychiatry/Behavioral Medicine team recommendations.     Willy Eddy, MD 12/30/16 [phone]

## 2016-12-30 NOTE — ED Notes (Signed)
Pt coming to nurses station door frequently. Pt keeps asking if the nurses are going to show his new group home owner the camera footage of his stay in the BHU.  Pt was told this footage is only viewed by security.  Pt accepting and returned to his room. Maintained on 15 minute checks and observation by security camera for safety.

## 2016-12-31 NOTE — ED Notes (Signed)
PT  VOL/  PENDING  PLACEMENT 

## 2016-12-31 NOTE — ED Provider Notes (Signed)
-----------------------------------------   6:56 AM on 12/31/2016 -----------------------------------------   Blood pressure 110/60, pulse 69, temperature 97.8 F (36.6 C), temperature source Oral, resp. rate 18, SpO2 98 %.  The patient had no acute events since last update.  Sleeping at this time.  Disposition is pending Psychiatry/Behavioral Medicine team recommendations.     Irean Hong, MD 12/31/16 [phone]

## 2016-12-31 NOTE — ED Notes (Signed)
Patient is taking a shower. Patient bed linen changed.

## 2016-12-31 NOTE — ED Notes (Signed)
Patient is alert and oriented. He is taking his medications as ordered. He voices no concerns. He denies SI/HI and A/V/H. He interacts with staff and peers. He mood is pleasant. He is calm and cooperative with staff. Q 15 minute checks in progress and patient remains safe on unit. Monitoring of patient continues.

## 2017-01-01 NOTE — ED Provider Notes (Signed)
-----------------------------------------   6:28 AM on 01/01/2017 -----------------------------------------   Blood pressure 115/74, pulse 75, temperature 98.1 F (36.7 C), temperature source Oral, resp. rate 16, SpO2 99 %.  The patient had no acute events since last update.  Sleeping at this time.  Disposition is pending Psychiatry/Behavioral Medicine team recommendations.     Irean Hong, MD 01/01/17 [phone]

## 2017-01-01 NOTE — Clinical Social Work Note (Signed)
CSW spoke with Liborio Nixon at [facility] who stated they only accept adults 24 year of age and older and therefore declined pt. CSW spoke with Elease Hashimoto at [facility] [phone]) who stated she will come to assess pt on Wednesday 10/10. CSW continuing to follow and pursue placement.   Corlis Hove, Theresia Majors, [facility] Clinical Social Worker-ED [phone]

## 2017-01-02 NOTE — ED Provider Notes (Signed)
-----------------------------------------   2:09 AM on 01/02/2017 -----------------------------------------   Blood pressure 122/83, pulse 82, temperature 98.1 F (36.7 C), temperature source Oral, resp. rate 18, SpO2 98 %.  The patient had no acute events since last update.  Calm and cooperative at this time.  Disposition is pending Psychiatry/Behavioral Medicine team recommendations.     Merrily Brittle, MD 01/02/17 [phone]

## 2017-01-02 NOTE — ED Notes (Signed)
Behavior Activity: Pacing on the unit, frequent attention seeking. Responds to redirection from staff  Attitude: Cooperative Mood/Affect: anxious/blunted Thought Process: concrete thinking Thought Content: preoccupation with d/c Perceptual Disturbance: N/A Orientation: A/OX4 Hygiene: Minimal Meals: snack provided Visitors: N/A PRN Medication: ativan prn for anxiety

## 2017-01-02 NOTE — ED Notes (Signed)
Patient voluntary and is pending placement. 

## 2017-01-02 NOTE — Clinical Social Work Note (Addendum)
CSW spoke with Clydie Braun [phone]) at [facility] who stated pt does not have and has been denied for care coordination services, but does have B3 IDD services; [employer], which should be able to assist with placement. Clydie Braun stated pt does not have authorization for these services at this time, but has not been closed out/discharged from the service. Per Clydie Braun, the [employer] is [employer] [phone]). Pt is also receiving PSR services, which provides funding for pt's Day programs. Clydie Braun states pt is on the innovations waiver waitlist, but waitlist is lengthy. CSW referred to Jaye Beagle to discuss Innovations Waiver status at [phone].   CSW left a voicemail with [employer] in the [employer]. CSW left voicemail for [employer], who's voicemail stated she will be out of the office until Monday 10/15. CSW received call from Whitman Hero at [facility] [phone]) in [place] stating he will staff case with supervisor for consideration and get back to CSW. CSW to follow up with Fannie K[facility] for Wednesday assessment time. CSW also made an Adult Protective Services report on this day. CSW continuing to follow and pursue placement.   Corlis Hove, Theresia Majors, [facility] Clinical Social Worker-ED [phone]

## 2017-01-03 DIAGNOSIS — F79 Unspecified intellectual disabilities: Secondary | ICD-10-CM | POA: Diagnosis not present

## 2017-01-03 MED ORDER — LORAZEPAM 2 MG PO TABS
2.0000 mg | ORAL_TABLET | Freq: Once | ORAL | Status: AC
  Administered 2017-01-03: 2 mg via ORAL
  Filled 2017-01-03: qty 1

## 2017-01-03 NOTE — ED Notes (Signed)
Patient is alert and oriented. Taking medications as ordered. Patient voices no concerns. Patient calm and cooperative. Q 15 minute checks in progress and patient remains safe on unit. Patient denies SI/HI and A/V/H. Monitoring of patient continues.

## 2017-01-03 NOTE — ED Notes (Addendum)
Patient cooperative during the shift.  He slept well during the night.  Patient wants to ensure that Dr Toni Amend will speak with him today concerning his group home placement.

## 2017-01-03 NOTE — Consult Note (Signed)
[facility] Face-to-Face Psychiatry Consult   Reason for Consult:  Consult for 26 year old man with a history of intellectual disability and autistic spectrum disorder brought to the emergency room today Referring Physician:  Siadecki Patient Identification: Brendan Gamble MRN:  [id number] Principal Diagnosis: Mental retardation Diagnosis:   Patient Active Problem List   Diagnosis Date Noted  . Aggressive behavior [R46.89] 09/10/2015  . Tobacco use disorder [F17.200] 09/10/2015  . Seizures (HCC) [R56.9] 06/02/2015  . Mental retardation [F79] 06/02/2015  . Elevated blood pressure [IMO0001] 06/02/2015  . Involuntary commitment [Z04.6]   . Autistic disorder, active [F84.0]     Total Time spent with patient: 20 minutes  Subjective:   Brendan Gamble is a 26 y.o. male patient admitted with "it was because I don't have my medicines".  Follow-up Friday the fifth. Patient has no new complaints. No significant change to behavior. Occasionally gets irritated and a little bit edgy but has not been violent or threatening.  Follow-up for today Thursday the 10th. Patient has now been in the emergency room multiple days awaiting placement. As would be expected he is getting progressively more irritated. A group home manager came to speak to him a couple days ago and led him to believe that he would be discharged but on contact from social work today has rescinded that off her because of the patient's finances. Obviously the patient's cognitive abilities do not let him fully understand all of this and process it so he is just getting more irritated although he has not been violent so far  HPI:  Patient interviewed chart reviewed. Patient is well known from prior encounters although it has been 2 years since we last saw him. He was brought to the emergency room this evening voluntarily evidently by his mother. Mother is no longer available not answering her phone and not in the waiting room so I just got history from  the patient. Patient has moderate cognitive disability making his history suspect. He tells me that today he was being transported from his old group home, in [place], to a new group home in [place]. All of this had been arranged in advance and his mother drove him to [facility]. If I understand correctly when they got there the new group home refused to admit him because of something involving the finances and because they did not have any medications on hand. Somehow or another this led them to bring him back to our emergency room. Patient is currently asymptomatic. Denies being depressed. Denies suicidal or homicidal thoughts. Denies any hallucinations. Denies that he's been having any conflicts or run-ins and there is no evidence of any recent fighting. He cannot recall his last seizure and says he has been medicine compliant.  Social history: Apparently he was being transferred to a new group home in [place] by his mother. Exactly why this fell through and what is going on here is unclear.  Medical history: Patient has a seizure disorder and is on 2 different anticonvulsants chronically. History of high blood pressure.  Substance abuse history: No history of substance abuse problems  Past Psychiatric History: Patient has a diagnosis of autistic spectrum disorder and moderate chronic intellectual disability. He has a long history of some behavior problems typical of these conditions. He often gets oppositional especially with his family and will either lash out or banging his head or do some other behavior when he is frustrated. When he has been in our care here in the emergency  room his behavior is usually perfectly fine for the most part. He does not have a history of schizophrenia or real suicide attempts although again he has done some head banging and dangerous behaviors in the past.  Risk to Self: Is patient at risk for suicide?: No Risk to Others:   Prior Inpatient Therapy:     Prior Outpatient Therapy:    Past Medical History:  Past Medical History:  Diagnosis Date  . Autism   . Hypertension   . Seizures (HCC)    No past surgical history on file. Family History: No family history on file. Family Psychiatric  History: None is identified Social History:  History  Alcohol Use No     History  Drug Use No    Social History   Social History  . Marital status: Single    Spouse name: N/A  . Number of children: N/A  . Years of education: N/A   Occupational History  . Disabled    Social History Main Topics  . Smoking status: Current Some Day Smoker    Packs/day: 0.25  . Smokeless tobacco: Never Used  . Alcohol use No  . Drug use: No  . Sexual activity: Not Asked   Other Topics Concern  . None   Social History Narrative   Lives at [facility].   Right-handed.   Additional Social History:    Allergies:  No Known Allergies  Labs:  No results found for this or any previous visit (from the past 48 hour(s)).  Current Facility-Administered Medications  Medication Dose Route Frequency Provider Last Rate Last Dose  . carbamazepine (TEGRETOL) tablet 800 mg  800 mg Oral BID PC Clapacs, John T, MD   800 mg at 01/03/17 1705  . levETIRAcetam (KEPPRA) tablet 750 mg  750 mg Oral BID Clapacs, Jackquline Denmark, MD   750 mg at 01/03/17 0950  . LORazepam (ATIVAN) tablet 0.5 mg  0.5 mg Oral Q6H PRN Clapacs, Jackquline Denmark, MD   0.5 mg at 01/02/17 2129  . metoprolol tartrate (LOPRESSOR) tablet 25 mg  25 mg Oral BID Clapacs, Jackquline Denmark, MD   25 mg at 01/03/17 0950  . risperiDONE (RISPERDAL) tablet 0.5 mg  0.5 mg Oral QHS Clapacs, John T, MD   0.5 mg at 01/02/17 2128  . sertraline (ZOLOFT) tablet 100 mg  100 mg Oral Daily Clapacs, Jackquline Denmark, MD   100 mg at 01/03/17 1610   Current Outpatient Prescriptions  Medication Sig Dispense Refill  . carbamazepine (TEGRETOL XR) 400 MG 12 hr tablet Take 2 tablets (800 mg total) by mouth 2 (two) times daily. 120 tablet 3  . levETIRAcetam  (KEPPRA) 750 MG tablet Take 1 tablet (750 mg total) by mouth 2 (two) times daily. 60 tablet 2  . LORazepam (ATIVAN) 0.5 MG tablet Take 1 tablet (0.5 mg total) by mouth at bedtime. As needed for agitation 30 tablet 1  . metoprolol tartrate (LOPRESSOR) 25 MG tablet Take 1 tablet (25 mg total) by mouth 2 (two) times daily. 60 tablet 2  . risperiDONE (RISPERDAL) 0.5 MG tablet Take 0.5 mg by mouth daily.    . sertraline (ZOLOFT) 100 MG tablet Take 200 mg by mouth daily.      Musculoskeletal: Strength & Muscle Tone: within normal limits Gait & Station: normal Patient leans: N/A  Psychiatric Specialty Exam: Physical Exam  Nursing note and vitals reviewed. Constitutional: He appears well-developed and well-nourished.    HENT:  Head: Normocephalic and atraumatic.  Eyes: Pupils  are equal, round, and reactive to light. Conjunctivae are normal.  Neck: Normal range of motion.  Cardiovascular: Regular rhythm and normal heart sounds.   Respiratory: Effort normal. No respiratory distress.  GI: Soft.  Musculoskeletal: Normal range of motion.  Neurological: He is alert.  Skin: Skin is warm and dry.  Psychiatric: He has a normal mood and affect. Judgment normal. His speech is delayed. He is slowed. Thought content is not paranoid. Cognition and memory are impaired. He expresses no homicidal and no suicidal ideation.    Review of Systems  Constitutional: Negative.   HENT: Negative.   Eyes: Negative.   Respiratory: Negative.   Cardiovascular: Negative.   Gastrointestinal: Negative.   Musculoskeletal: Negative.   Skin: Negative.   Neurological: Negative.   Psychiatric/Behavioral: The patient is nervous/anxious.     Blood pressure (!) 99/51, pulse 72, temperature 98 F (36.7 C), temperature source Oral, resp. rate 18, SpO2 99 %.There is no height or weight on file to calculate BMI.  General Appearance: Fairly Groomed  Eye Contact:  Fair  Speech:  Slow  Volume:  Decreased  Mood:  Anxious and  Irritable  Affect:  Constricted  Thought Process:  Disorganized  Orientation:  Full (Time, Place, and Person)  Thought Content:  Logical, Rumination and Tangential  Suicidal Thoughts:  No  Homicidal Thoughts:  No  Memory:  Immediate;   Fair Recent;   Fair Remote;   Fair  Judgement:  Fair  Insight:  Fair  Psychomotor Activity:  Normal  Concentration:  Concentration: Fair  Recall:  [employer] of Knowledge:  Fair  Language:  Fair  Akathisia:  No  Handed:  Right  AIMS (if indicated):     Assets:  Manufacturing systems engineer Physical Health Resilience Social Support  ADL's:  Intact  Cognition:  Impaired,  Mild and Moderate  Sleep:        Treatment Plan Summary: Daily contact with patient to assess and evaluate symptoms and progress in treatment, Medication management and Plan No change to mental status except for being more irritable and anxious. No sign of active psychosis. I ordered a 1 time dose of Ativan this afternoon because he appeared to be getting worked up to where he was possibly at risk of lashing out. Otherwise he is doing okay on the current medications. Patient continues to be psychiatrically stable and "cleared" in the sense of not needing inpatient treatment but record being entirely an issue for placement. Social work and I discussed the case today. Continue with process of trying to find discharge planning  Disposition: Patient does not meet criteria for psychiatric inpatient admission. Supportive therapy provided about ongoing stressors.  Mordecai Rasmussen, MD 01/03/2017 8:22 PM

## 2017-01-03 NOTE — ED Notes (Signed)
Patient taking shower and bed linen was changed.

## 2017-01-03 NOTE — Clinical Social Work Note (Signed)
CSW left voicemail with Dan Europe [phone]. [phone]) at [facility] to follow up on [employer]. CSW spoke with Teodora Medici of [facility] [phone]) who states she is considering admitting pt. Pt also being considered by Chales Abrahams at [facility]. CSW continuing to follow and pursue placement.  Corlis Hove, Theresia Majors, [facility] Clinical Social Worker-ED [phone]

## 2017-01-03 NOTE — ED Provider Notes (Signed)
-----------------------------------------   8:01 AM on 01/03/2017 -----------------------------------------   Blood pressure 114/77, pulse 88, temperature 97.6 F (36.4 C), temperature source Oral, resp. rate 16, SpO2 100 %.  The patient had no acute events since last update.  Calm and cooperative at this time.  Disposition is pending Psychiatry/Behavioral Medicine team recommendations.     Rebecka Apley, MD 01/03/17 [phone]

## 2017-01-04 NOTE — ED Provider Notes (Signed)
-----------------------------------------   9:06 AM on 01/04/2017 -----------------------------------------   Blood pressure 131/66, pulse 76, temperature 97.6 F (36.4 C), temperature source Oral, resp. rate 20, SpO2 98 %.  The patient had no acute events since last update.  Calm and cooperative at this time.  Disposition is pending Psychiatry/Behavioral Medicine team recommendations.     Sharman Cheek, MD 01/04/17 [phone]

## 2017-01-04 NOTE — ED Notes (Signed)
Pt irritable this morning. Pt wanting to speak with social worker about possible placement with a group home owner he meet with earlier this week. RN explained LCSW would be up to see him later today and she is doing all she can to have him placed. Pt not fully understanding the process. PRM medication administered. Maintained on 15 minute checks and observation by security camera for safety.

## 2017-01-04 NOTE — ED Notes (Signed)
Patient depressed and anxious concerning his discharge.  He is requesting clarification from the MD and SW concerning his finances interfering with his group home placement.  Advised patient that I would forward his request to the AM RN.

## 2017-01-04 NOTE — Clinical Social Work Note (Signed)
CSW received a call from [facility] [phone]) stating he received a call about a referral but never received a fax. CSW faxed referral H&P, FL-2, psych assessment, and MD notes to [phone] for admission consideration. CSW confirmed faxed received. CSW continuing to follow and pursue placement.  Corlis Hove, Theresia Majors, [facility] Clinical Social Worker-ED [phone]

## 2017-01-04 NOTE — ED Notes (Signed)
Pt vol/pending placement  

## 2017-01-05 NOTE — ED Notes (Signed)
Tyson from [facility] came to speak with the patient [phone]).  The patient declined Tyson's offer because he believes he is going to another group home. RN informed group home representative this was false. RN will communicate with LCSW.

## 2017-01-05 NOTE — ED Notes (Signed)

## 2017-01-05 NOTE — ED Provider Notes (Signed)
Blood pressure (!) 117/58, pulse 76, temperature 97.9 F (36.6 C), temperature source Oral, resp. rate 18, SpO2 98 %.  This patient was not reported to have had any acute events overnight and continuing to await social work disposition to group home.   Governor Rooks, MD 01/05/17 [phone]

## 2017-01-05 NOTE — ED Notes (Signed)
Representative from DSS ( [facility] [employer]) came to speak with patient.

## 2017-01-05 NOTE — ED Notes (Signed)
Patient asleep in room. No noted distress or abnormal behavior. Will continue 15 minute checks and observation by security cameras for safety. 

## 2017-01-05 NOTE — ED Notes (Signed)
Pt with depressed, flat affect. Compliant with VS and medications. Pt allowed to use the phone. Maintained on 15 minute checks and observation by security camera for safety.

## 2017-01-05 NOTE — ED Notes (Signed)
Patient resting quietly in room. No noted distress or abnormal behaviors noted. Will continue 15 minute checks and observation by security camera for safety. 

## 2017-01-05 NOTE — Progress Notes (Addendum)
Tyson group home owner from [facility] [phone] ([address] [place], [place]) came to assess patient today. Per Chales Abrahams he can make a bed offer for patient however patient told Chales Abrahams that he does not want to go. Per Chales Abrahams patient stated he is going to a group home in [place].  CSW also contacted the following facilities for regarding placement for patient:  [facility]: Per Wonda Amis group home owner they have no male beds.   [facility]: CSW attempted to contact group home [facility] however it was not a working number.   [facility]: Per [place] group home owner they have no male beds.  [facility]: Per Elease Hashimoto group home owner she will come out to [facility] and assess patient.   [facility]: Per group home owner Teodora Medici she does not have a contact with [facility] however she is looking into getting one.  [facility] worker Efraim Kaufmann Black [phone] is following patient and is aware of above.    [employer], LCSW [phone]

## 2017-01-06 NOTE — ED Notes (Signed)
Patient received PM snack. 

## 2017-01-06 NOTE — ED Notes (Signed)
Patient is watching tv in dayroom, no signs of distress.

## 2017-01-06 NOTE — ED Notes (Signed)
Patient took a shower.

## 2017-01-06 NOTE — ED Notes (Signed)
Patient ate 100% of lunch tray, Patient calm and cooperative.

## 2017-01-06 NOTE — ED Notes (Signed)
Patient ate 100% of breakfast, no signs of distress.

## 2017-01-06 NOTE — ED Notes (Signed)

## 2017-01-06 NOTE — ED Notes (Signed)
Patient is talking on the phone at present time,  Patient is cooperative, talks to self often, remains safe, q 15 minute checks and camera surveillance in progress for safety.

## 2017-01-06 NOTE — ED Notes (Signed)
Voluntary. Pending placement 

## 2017-01-06 NOTE — ED Notes (Signed)
Patient took all morning medications, ask for phone, he called someone, remains calms and cooperative , Patient is without signs of distress, q 15 minute checks and camera surveillance in progress for safety.

## 2017-01-06 NOTE — ED Provider Notes (Signed)
-----------------------------------------   6:36 AM on 01/06/2017 -----------------------------------------   Blood pressure 109/62, pulse 74, temperature (!) 97.4 F (36.3 C), temperature source Oral, resp. rate 18, SpO2 98 %.  The patient had no acute events since last update.  Sleeping at this time.  Disposition is pending CSW team recommendations.     Irean Hong, MD 01/06/17 [phone]

## 2017-01-07 NOTE — ED Notes (Signed)
Voluntary. Pending placement

## 2017-01-07 NOTE — ED Notes (Signed)
Report was received from Osie B., RN; Pt. Verbalizes no complaints or distress; denies S.I./Hi.; awaiting placement; Continue to monitor with 15 min. Monitoring.

## 2017-01-07 NOTE — ED Notes (Signed)
Patient is voluntary and is pending placement. 

## 2017-01-07 NOTE — ED Notes (Signed)
Patient received PM snack. 

## 2017-01-07 NOTE — ED Provider Notes (Signed)
-----------------------------------------   7:34 AM on 01/07/2017 -----------------------------------------   Blood pressure 125/66, pulse 68, temperature 98.1 F (36.7 C), temperature source Oral, resp. rate 18, SpO2 97 %.  The patient had no acute events since last update.  Calm and cooperative at this time.  Disposition is pending CSW team recommendations.     Irean Hong, MD 01/07/17 [phone]

## 2017-01-07 NOTE — ED Notes (Signed)

## 2017-01-07 NOTE — ED Notes (Signed)
Pt has been asleep most of the shift. Pt denies pain/ah/vh/si/and hi at this time. No s/sx of distress noted or reported. Medication compliant. Remains safe. Will cont to monitor pt.

## 2017-01-08 MED ORDER — CARBAMAZEPINE ER 200 MG PO TB12
800.0000 mg | ORAL_TABLET | Freq: Two times a day (BID) | ORAL | Status: DC
  Administered 2017-01-08 – 2017-01-09 (×2): 800 mg via ORAL
  Filled 2017-01-08 (×4): qty 4

## 2017-01-08 NOTE — ED Notes (Signed)
Pt given breakfast tray

## 2017-01-08 NOTE — ED Notes (Signed)
VOL  PENDING  PLACEMENT 

## 2017-01-08 NOTE — ED Notes (Signed)
Pt will be going to [facility] group [facility] tomorrow after 11 am.  Pt is not happy because this will change the route he is used to taking to his day program. Pt given PRN Ativan. Maintained on 15 minute checks and observation by security camera for safety.

## 2017-01-08 NOTE — ED Notes (Signed)
Pt requesting to speak with Dr. Toni Amend. Pt unable to accept he will be going to [facility] and not the group home he prefers. Pt easily re-directed. Maintained on 15 minute checks and observation by security camera for safety.

## 2017-01-08 NOTE — ED Provider Notes (Signed)
-----------------------------------------   6:52 AM on 01/08/2017 -----------------------------------------   Blood pressure 114/63, pulse 92, temperature 98.2 F (36.8 C), temperature source Oral, resp. rate 18, SpO2 98 %.  The patient had no acute events since last update.  Calm and cooperative at this time.  Disposition is pending Psychiatry/Behavioral Medicine team recommendations.      Jeanmarie Plant, MD 01/08/17 [phone]

## 2017-01-08 NOTE — ED Notes (Signed)
Pt received dinner tray.

## 2017-01-08 NOTE — ED Notes (Signed)
Pt received lunch tray 

## 2017-01-08 NOTE — Clinical Social Work Note (Signed)
CSW spoke with Brayton Mars [phone]) of [facility] group [facility] who stated she has called [facility] to see if they will give her a 1 on 1 contract. CSW left VMs for Kristeen Mans [phone]) at [facility], [place] [phone]) at [facility], and Elonda Husky [phone]) at [place]. CSW spoke with [facility] SW Leia Alf [phone]), who stated she is an Engineer, mining as the report was not screened in for an Adult pProtective Services investigation. Ms. Renard Hamper stated she met with pt last week and she was informed pt has been offered a [facility] placement with Elonda Husky at [place]. CSW asked if Ms. Black spoke with pt's mother/guardian and she stated no. Ms. Renard Hamper stated there are no further interventions she can provide for pt. Per CSW handoff, Elonda Husky extended a bed offer, but pt refused stating he's going to a [facility] in [place].  CSW met with pt at bedside this AM to explain that if Elonda Husky extends an offer for a bed, pt will be discharged to [place] and will still be able to continue attending his [facility] in [place]. CSW explained that pt is medically and psychiatrically able to leave and we cannot wait for a bed to come open in an [facility]. Pt expressed that he wants to go to a [facility] in [place], where he knows his route to the Day Program. CSW reiterated the above information to pt. Pt asked to call his mom. CSW made BHU RN aware of his request. CSW called pt's mom/guardian Mat Stuard [phone]) to inform her of offer. Ms. Herbers stated she would prefer pt go to another [facility] as [facility] is too close to Ms. Chuong's home. CSW informed Ms. Brazel that we will be accepting the first offer proposed for placement and that if Ms. Hottenstein is not pleased with the placement arranged, Ms. Haynesworth can make arrangements for alternative placements on her own once pt is at [place]. Ms. Ciresi expressed understanding and agreement to plan.  CSW staffed with case with CSW AD Zack and he is aware and in agreement with discharge plan.   CSW spoke with Elonda Husky who stated he can pick pt up tomorrow (Tuesday) after 11am for discharge. CSW provided [place] with contact information for Pt's mom, [place], and Engineering geologist. CSW called pt's mom to update her and inform her Elonda Husky will be contacting her today. CSW also confirmed Ms. Luallen still has $200 check that would need to be provided to [facility]. Ms. Coccia understanding and in agreement. CSW continuing to follow for discharge needs.   Oretha Ellis, Latanya Presser, [place] Social Worker-ED [phone]

## 2017-01-09 MED ORDER — METOPROLOL TARTRATE 25 MG PO TABS: 25.0000 mg | ORAL_TABLET | Freq: Two times a day (BID) | ORAL | 0 refills | Status: AC

## 2017-01-09 MED ORDER — LEVETIRACETAM 750 MG PO TABS
750.0000 mg | ORAL_TABLET | Freq: Two times a day (BID) | ORAL | 0 refills | Status: AC
Start: 2017-01-09 — End: ?

## 2017-01-09 MED ORDER — PIPERACILLIN-TAZOBACTAM 3.375 G IVPB 30 MIN
3.3750 g | Freq: Once | INTRAVENOUS | Status: DC
Start: 2017-01-09 — End: 2017-01-09
  Filled 2017-01-09: qty 50

## 2017-01-09 MED ORDER — RISPERIDONE 0.5 MG PO TABS: 0.5000 mg | ORAL_TABLET | Freq: Every day | ORAL | 0 refills | Status: AC

## 2017-01-09 MED ORDER — CARBAMAZEPINE ER 400 MG PO TB12: 800.0000 mg | ORAL_TABLET | Freq: Two times a day (BID) | ORAL | 0 refills | Status: AC

## 2017-01-09 MED ORDER — LEVETIRACETAM 750 MG PO TABS: 750.0000 mg | ORAL_TABLET | Freq: Two times a day (BID) | ORAL | 0 refills | Status: AC

## 2017-01-09 MED ORDER — SERTRALINE HCL 100 MG PO TABS: 100.0000 mg | ORAL_TABLET | Freq: Every day | ORAL | 0 refills | Status: AC

## 2017-01-09 MED ORDER — SERTRALINE HCL 100 MG PO TABS
200.0000 mg | ORAL_TABLET | Freq: Every day | ORAL | 0 refills | Status: AC
Start: 2017-01-09 — End: ?

## 2017-01-09 MED ORDER — PIPERACILLIN-TAZOBACTAM 3.375 G IVPB 30 MIN
3.3750 g | Freq: Once | INTRAVENOUS | Status: DC
  Filled 2017-01-09: qty 50

## 2017-01-09 NOTE — ED Notes (Signed)
Pt d/c home with caregiver. All belongings accounted for prior to d/c. Pt denies pain/ah/vh/si/and hi prior to d/c. No concerns voiced or reported prior to d/c.

## 2017-01-09 NOTE — ED Provider Notes (Signed)
-----------------------------------------   7:33 AM on 01/09/2017 -----------------------------------------   BP (!) 122/58 (BP Location: Right Arm)   Pulse 73   Temp 98 F (36.7 C) (Oral)   Resp 18   SpO2 98%   No acute events since last update. Disposition is pending per Psychiatry/Behavioral Medicine team recommendations.     Phineas Semen, MD 01/09/17 [phone]

## 2017-01-09 NOTE — Discharge Instructions (Signed)
Continue all medications as prescribed, and follow up with your primary care doctor and psychiatrist.

## 2017-01-09 NOTE — ED Notes (Signed)

## 2017-01-09 NOTE — Clinical Social Work Note (Addendum)
Pt scheduled to be picked up today no later than 2pm by Brendan Gamble of [facility]. CSW also received a call back from Brendan Gamble [phone]) of [place] stating she is [employer] and is able to assist with resources when pt is discharge. Ms. Brendan Gamble contact information provided to [name], with permission.   PLAN: Awaiting discharge order and Rx/medication. CSW made BHU RN and MD aware. CSW receveid call from RN Ossie stating pt caregiver needing RX, medication supply, guardianship paperwork, and discharge summary. CSW stated RN is to get AVS, RX, and medication supply. RN Ossie stated "we don't do that." CSW retrieved Rx and AVS from ED secretary and gave to RN Ossie to go over with [name]. [name] was already given the 7 day supply of medication. CSW informed [name] that he can get a copy of guardianship paperwork and check from Guardian/mother Brendan Gamble. [name] expressed understanding. CSW signing off as no further Social Work needs identified.   Brendan Gamble, Brendan Gamble, [facility] Clinical Social Worker-ED [phone]

## 2017-01-09 NOTE — ED Notes (Signed)
Pt. Alert and oriented, warm and dry, in no distress. Pt. Denies SI, HI, and AVH. Pt. Encouraged to let nursing staff know of any concerns or needs. 

## 2017-01-09 NOTE — ED Provider Notes (Signed)
case discussed with social work and psychiatry. Patient is medically and psychiatrically stable for discharge home to group home. I have requested that the lab provide a 7 day supply of his medications so that they can continue without interruption while the group home fills his prescriptions.   Sharman Cheek, MD 01/09/17 [phone]
